# Patient Record
Sex: Female | Born: 1985 | Race: Black or African American | Hispanic: No | Marital: Married | Smoking: Current some day smoker
Health system: Southern US, Community
[De-identification: ages and names within clinical notes are randomized; demographics above are authoritative.]

## PROBLEM LIST (undated history)

## (undated) ENCOUNTER — Emergency Department (HOSPITAL_COMMUNITY): Admission: EM | Payer: Medicaid Other

## (undated) ENCOUNTER — Inpatient Hospital Stay (HOSPITAL_COMMUNITY): Payer: Self-pay

## (undated) DIAGNOSIS — I10 Essential (primary) hypertension: Secondary | ICD-10-CM

## (undated) DIAGNOSIS — D649 Anemia, unspecified: Secondary | ICD-10-CM

## (undated) DIAGNOSIS — F329 Major depressive disorder, single episode, unspecified: Secondary | ICD-10-CM

## (undated) DIAGNOSIS — A599 Trichomoniasis, unspecified: Secondary | ICD-10-CM

## (undated) DIAGNOSIS — F32A Depression, unspecified: Secondary | ICD-10-CM

## (undated) DIAGNOSIS — R011 Cardiac murmur, unspecified: Secondary | ICD-10-CM

## (undated) DIAGNOSIS — B999 Unspecified infectious disease: Secondary | ICD-10-CM

## (undated) DIAGNOSIS — O139 Gestational [pregnancy-induced] hypertension without significant proteinuria, unspecified trimester: Secondary | ICD-10-CM

## (undated) DIAGNOSIS — IMO0002 Reserved for concepts with insufficient information to code with codable children: Secondary | ICD-10-CM

## (undated) DIAGNOSIS — N730 Acute parametritis and pelvic cellulitis: Secondary | ICD-10-CM

## (undated) DIAGNOSIS — A549 Gonococcal infection, unspecified: Secondary | ICD-10-CM

## (undated) DIAGNOSIS — A749 Chlamydial infection, unspecified: Secondary | ICD-10-CM

## (undated) HISTORY — PX: GANGLION CYST EXCISION: SHX1691

## (undated) HISTORY — PX: MOUTH SURGERY: SHX715

---
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
## 2006-01-25 ENCOUNTER — Emergency Department (HOSPITAL_COMMUNITY): Admission: EM | Admit: 2006-01-25 | Discharge: 2006-01-25 | Payer: Self-pay | Admitting: Emergency Medicine

## 2006-04-06 ENCOUNTER — Emergency Department (HOSPITAL_COMMUNITY): Admission: EM | Admit: 2006-04-06 | Discharge: 2006-04-06 | Payer: Self-pay | Admitting: *Deleted

## 2006-06-11 ENCOUNTER — Emergency Department (HOSPITAL_COMMUNITY): Admission: EM | Admit: 2006-06-11 | Discharge: 2006-06-11 | Payer: Self-pay | Admitting: Emergency Medicine

## 2006-12-09 ENCOUNTER — Emergency Department (HOSPITAL_COMMUNITY): Admission: EM | Admit: 2006-12-09 | Discharge: 2006-12-09 | Payer: Self-pay | Admitting: Emergency Medicine

## 2007-03-03 ENCOUNTER — Inpatient Hospital Stay (HOSPITAL_COMMUNITY): Admission: AD | Admit: 2007-03-03 | Discharge: 2007-03-03 | Payer: Self-pay | Admitting: Obstetrics & Gynecology

## 2007-03-06 ENCOUNTER — Ambulatory Visit: Payer: Self-pay | Admitting: Obstetrics and Gynecology

## 2007-03-06 ENCOUNTER — Inpatient Hospital Stay (HOSPITAL_COMMUNITY): Admission: AD | Admit: 2007-03-06 | Discharge: 2007-03-07 | Payer: Self-pay | Admitting: Obstetrics and Gynecology

## 2007-03-09 ENCOUNTER — Emergency Department (HOSPITAL_COMMUNITY): Admission: EM | Admit: 2007-03-09 | Discharge: 2007-03-09 | Payer: Self-pay | Admitting: Emergency Medicine

## 2007-11-28 ENCOUNTER — Emergency Department (HOSPITAL_COMMUNITY): Admission: EM | Admit: 2007-11-28 | Discharge: 2007-11-29 | Payer: Self-pay | Admitting: Emergency Medicine

## 2008-01-11 ENCOUNTER — Inpatient Hospital Stay (HOSPITAL_COMMUNITY): Admission: AD | Admit: 2008-01-11 | Discharge: 2008-01-11 | Payer: Self-pay | Admitting: Obstetrics & Gynecology

## 2008-05-24 ENCOUNTER — Inpatient Hospital Stay (HOSPITAL_COMMUNITY): Admission: AD | Admit: 2008-05-24 | Discharge: 2008-05-25 | Payer: Self-pay | Admitting: Obstetrics and Gynecology

## 2008-06-01 ENCOUNTER — Inpatient Hospital Stay (HOSPITAL_COMMUNITY): Admission: AD | Admit: 2008-06-01 | Discharge: 2008-06-04 | Payer: Self-pay | Admitting: Obstetrics and Gynecology

## 2008-06-02 ENCOUNTER — Encounter (INDEPENDENT_AMBULATORY_CARE_PROVIDER_SITE_OTHER): Payer: Self-pay | Admitting: Obstetrics and Gynecology

## 2008-06-06 ENCOUNTER — Inpatient Hospital Stay (HOSPITAL_COMMUNITY): Admission: AD | Admit: 2008-06-06 | Discharge: 2008-06-09 | Payer: Self-pay | Admitting: Obstetrics and Gynecology

## 2009-08-24 ENCOUNTER — Ambulatory Visit: Payer: Self-pay | Admitting: Diagnostic Radiology

## 2009-08-24 ENCOUNTER — Emergency Department (HOSPITAL_BASED_OUTPATIENT_CLINIC_OR_DEPARTMENT_OTHER): Admission: EM | Admit: 2009-08-24 | Discharge: 2009-08-24 | Payer: Self-pay | Admitting: Emergency Medicine

## 2009-08-29 ENCOUNTER — Ambulatory Visit: Payer: Self-pay | Admitting: Diagnostic Radiology

## 2009-08-29 ENCOUNTER — Emergency Department (HOSPITAL_BASED_OUTPATIENT_CLINIC_OR_DEPARTMENT_OTHER): Admission: EM | Admit: 2009-08-29 | Discharge: 2009-08-29 | Payer: Self-pay | Admitting: Emergency Medicine

## 2010-04-15 ENCOUNTER — Emergency Department (HOSPITAL_BASED_OUTPATIENT_CLINIC_OR_DEPARTMENT_OTHER): Admission: EM | Admit: 2010-04-15 | Discharge: 2010-04-15 | Payer: Self-pay | Admitting: Emergency Medicine

## 2010-05-26 ENCOUNTER — Emergency Department (HOSPITAL_COMMUNITY): Admission: EM | Admit: 2010-05-26 | Discharge: 2010-05-26 | Payer: Self-pay | Admitting: Emergency Medicine

## 2010-07-19 DIAGNOSIS — R87619 Unspecified abnormal cytological findings in specimens from cervix uteri: Secondary | ICD-10-CM

## 2010-07-19 DIAGNOSIS — IMO0002 Reserved for concepts with insufficient information to code with codable children: Secondary | ICD-10-CM

## 2010-07-19 HISTORY — DX: Unspecified abnormal cytological findings in specimens from cervix uteri: R87.619

## 2010-07-19 HISTORY — DX: Reserved for concepts with insufficient information to code with codable children: IMO0002

## 2010-09-16 ENCOUNTER — Emergency Department (INDEPENDENT_AMBULATORY_CARE_PROVIDER_SITE_OTHER): Payer: Medicaid Other

## 2010-09-16 ENCOUNTER — Emergency Department (HOSPITAL_BASED_OUTPATIENT_CLINIC_OR_DEPARTMENT_OTHER)
Admission: EM | Admit: 2010-09-16 | Discharge: 2010-09-16 | Disposition: A | Discharge status: Home / Self Care | Payer: Medicaid Other | Attending: Emergency Medicine | Admitting: Emergency Medicine

## 2010-09-16 DIAGNOSIS — J40 Bronchitis, not specified as acute or chronic: Secondary | ICD-10-CM | POA: Insufficient documentation

## 2010-09-16 DIAGNOSIS — R0989 Other specified symptoms and signs involving the circulatory and respiratory systems: Secondary | ICD-10-CM

## 2010-09-16 DIAGNOSIS — R05 Cough: Secondary | ICD-10-CM

## 2010-09-16 DIAGNOSIS — R059 Cough, unspecified: Secondary | ICD-10-CM | POA: Insufficient documentation

## 2010-09-16 DIAGNOSIS — Z87891 Personal history of nicotine dependence: Secondary | ICD-10-CM

## 2010-09-29 LAB — POCT CARDIAC MARKERS
CKMB, poc: <1 ng/mL — ABNORMAL LOW (ref 1.0–8.0)
Troponin i, poc: <0.05 ng/mL (ref 0.00–0.09)

## 2010-09-29 LAB — URINALYSIS, ROUTINE W REFLEX MICROSCOPIC
Glucose, UA: NEGATIVE mg/dL
Urobilinogen, UA: 0.2 mg/dL (ref 0.0–1.0)

## 2010-09-29 LAB — POCT PREGNANCY, URINE: Preg Test, Ur: NEGATIVE

## 2010-09-29 LAB — BASIC METABOLIC PANEL
Calcium: 8.9 mg/dL (ref 8.4–10.5)
GFR calc non Af Amer: >60 mL/min (ref >60)
Glucose, Bld: 113 mg/dL — ABNORMAL HIGH (ref 70–99)
Potassium: 4.4 mEq/L (ref 3.5–5.1)

## 2010-09-29 LAB — CBC
HCT: 41.4 % (ref 36.0–46.0)
MCV: 93.2 fL (ref 78.0–100.0)
Platelets: 258 10*3/uL (ref 150–400)

## 2010-09-29 LAB — DIFFERENTIAL
Basophils Absolute: 0.1 10*3/uL (ref 0.0–0.1)
Basophils Relative: 1 % (ref 0–1)
Lymphocytes Relative: 45 % (ref 12–46)
Monocytes Relative: 7 % (ref 3–12)
Neutro Abs: 2.9 10*3/uL (ref 1.7–7.7)
Neutrophils Relative %: 45 % (ref 43–77)

## 2010-09-29 LAB — CARDIAC PANEL(CRET KIN+CKTOT+MB+TROPI)
Relative Index: 0.6 (ref 0.0–2.5)
Total CK: 209 U/L — ABNORMAL HIGH (ref 7–177)
Troponin I: 0.01 ng/mL (ref 0.00–0.06)

## 2010-10-01 LAB — PREGNANCY, URINE: Preg Test, Ur: NEGATIVE

## 2010-10-08 LAB — GC/CHLAMYDIA PROBE AMP, GENITAL: GC Probe Amp, Genital: NEGATIVE

## 2010-10-08 LAB — DIFFERENTIAL
Eosinophils Absolute: 0.1 10*3/uL (ref 0.0–0.7)
Lymphocytes Relative: 15 % (ref 12–46)
Lymphocytes Relative: 55 % — ABNORMAL HIGH (ref 12–46)
Lymphs Abs: 5 10*3/uL — ABNORMAL HIGH (ref 0.7–4.0)
Monocytes Absolute: 0.9 10*3/uL (ref 0.1–1.0)
Monocytes Relative: 16 % — ABNORMAL HIGH (ref 3–12)
Monocytes Relative: 7 % (ref 3–12)
Neutro Abs: 3.9 10*3/uL (ref 1.7–7.7)
Neutrophils Relative %: 36 % — ABNORMAL LOW (ref 43–77)

## 2010-10-08 LAB — URINALYSIS, ROUTINE W REFLEX MICROSCOPIC
Glucose, UA: NEGATIVE mg/dL
Glucose, UA: NEGATIVE mg/dL
Hgb urine dipstick: NEGATIVE
Ketones, ur: NEGATIVE mg/dL
Leukocytes, UA: NEGATIVE
Nitrite: NEGATIVE
Specific Gravity, Urine: 1.007 (ref 1.005–1.030)
Specific Gravity, Urine: 1.017 (ref 1.005–1.030)
Urobilinogen, UA: 0.2 mg/dL (ref 0.0–1.0)
pH: 7 (ref 5.0–8.0)

## 2010-10-08 LAB — URINE MICROSCOPIC-ADD ON

## 2010-10-08 LAB — BASIC METABOLIC PANEL
Chloride: 104 mEq/L (ref 96–112)
Creatinine, Ser: 0.9 mg/dL (ref 0.4–1.2)
GFR calc Af Amer: >60 mL/min (ref >60)
GFR calc non Af Amer: >60 mL/min (ref >60)
Potassium: 3.3 mEq/L — ABNORMAL LOW (ref 3.5–5.1)

## 2010-10-08 LAB — CBC
HCT: 40.6 % (ref 36.0–46.0)
MCV: 89.2 fL (ref 78.0–100.0)
MCV: 89.9 fL (ref 78.0–100.0)
Platelets: 236 10*3/uL (ref 150–400)
RBC: 4.79 MIL/uL (ref 3.87–5.11)
RDW: 13.2 % (ref 11.5–15.5)
WBC: 9 10*3/uL (ref 4.0–10.5)

## 2010-10-08 LAB — WET PREP, GENITAL

## 2010-10-08 LAB — PREGNANCY, URINE: Preg Test, Ur: NEGATIVE

## 2010-10-08 LAB — COMPREHENSIVE METABOLIC PANEL
Albumin: 4.2 g/dL (ref 3.5–5.2)
BUN: 5 mg/dL — ABNORMAL LOW (ref 6–23)
Calcium: 8.7 mg/dL (ref 8.4–10.5)
Creatinine, Ser: 0.9 mg/dL (ref 0.4–1.2)
Potassium: 3.6 mEq/L (ref 3.5–5.1)
Total Protein: 7.2 g/dL (ref 6.0–8.3)
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
## 2010-12-01 NOTE — Discharge Summary (Signed)
Morgan Newton, POTE               ACCOUNT NO.:  [id number]   MEDICAL RECORD NO.:  [id number]          PATIENT TYPE:  INP   LOCATION:  9306                          FACILITY:  [facility]   PHYSICIAN:  Phil D. Kalman Shan, M.D.     DATE OF BIRTH:  28-Jul-1985   DATE OF ADMISSION:  03/06/2007  DATE OF DISCHARGE:  03/07/2007                               DISCHARGE SUMMARY   ADMISSION DIAGNOSES:  1. Gonorrhea.  2. Pelvic inflammatory disease.   DISCHARGE DIAGNOSES:  1. Gonorrhea.  2. Pelvic inflammatory disease.   HISTORY OF PRESENT ILLNESS:  The patient is a 25 year old G 1, P 1, who  was seen in the emergency room for pelvic pain.  Subsequent analysis  found the patient to have a positive gonorrhea culture.  The patient was  given a prescription for treatment with doxycycline and cephalosporin,  but the patient was unable to fill these prescriptions and returned to  the emergency room a few days later, complaining of increased pain.   On admission the patient was found to have severe abdominal pain and  severe cervical motion tenderness.  She was afebrile and had no other  symptoms.  The decision at this point was made to admit her for IV  antibiotics.   PHYSICAL EXAMINATION:  VITAL SIGNS: Temperature was 98.6 degrees, rest  of vital signs normal and stable.  ABDOMEN:  Moderate abdominal tenderness to palpation.  PELVIC:  Moderate cervical motion tenderness.  LUNGS:  Clear.  HEART:  Normal sounds.   HOSPITAL COURSE:  The patient was admitted and was started on  Ceftriaxone 1 gram q12h and was to continue her Doxycycline 100 mg po  twice daily.  While she was in the hospital, she remained afebrile.  Her  pain was controlled using Naproxen and Dilaudid.  On the day of  discharge the patient reported markedly improved pain scores.   LABORATORY DATA:  White count 11.4.  ESR 16.  Negative urinalysis.  Negative wet prep.   CONDITION ON DISCHARGE:  Stable.   DISCHARGE MEDICATIONS:  1.  Ibuprofen 800 mg p.o. q.8h. p.r.n. pain.  2. Doxycycline 100 mg p.o. twice daily, to complete a 14-day course.   DISCHARGE INSTRUCTIONS:  The patient was advised to make sure that her  partner was treated, given her diagnosis of gonorrhea.  She was also  advised to call with any worsening fevers, abdominal pain or any other  concerns or symptoms.  She was also strongly advised to complete her  antibiotic regimen.  The patient was given a number of the [facility],  to call to make an appointment for followup in the next two weeks.      [name], MD  Electronically Signed     ______________________________  Franchot Heidelberg. Kalman Shan, M.D.    UD/MEDQ  D:  03/07/2007  T:  03/07/2007  Job:  [id number]

## 2010-12-01 NOTE — Discharge Summary (Signed)
Morgan Newton, Morgan Newton               ACCOUNT NO.:  [id number]   MEDICAL RECORD NO.:  [id number]          PATIENT TYPE:  INP   LOCATION:  9371                          FACILITY:  [facility]   PHYSICIAN:  Everett Graff, M.D.   DATE OF BIRTH:  1985/10/11   DATE OF ADMISSION:  06/06/2008  DATE OF DISCHARGE:  06/09/2008                               DISCHARGE SUMMARY   ADMITTING DIAGNOSES:  1. Four days postpartum, status post spontaneous vaginal delivery      June 02, 2008, of intrauterine fetal demise.  2. Preeclampsia.  3. Anemia.  4. History of asthma.  5. Left lower quadrant pain   DISCHARGE DIAGNOSIS:  1. She is now postpartum and day #7 status post spontaneous vaginal      delivery intrauterine fetal demise on June 02, 2008.  2. Preeclampsia.  3. Anemia.  4. History of asthma.  5. Left lower quadrant pain, resolved.  6. Constipation.   PROCEDURES:  Magnesium sulfate infusion for approximately 24 hours while  admitted to adult intensive care unit as well as blood pressure  management.   HOSPITAL COURSE:  Morgan Newton is a 25 year old single black female who  presented on the day of admission 4 days postpartum status post a  preterm spontaneous vaginal delivery of an IFD on June 02, 2008.  Her chief complaint was general malaise, not feeling well, which had  worsened over the course of that day.  She was complaining on admission  of left lower quadrant pain which radiated to her back.  She also had  some chest pain which she described as someone standing on her chest.  She was without headache, visual disturbances or any right upper  quadrant pain.  Her blood pressures while in maternity admissions unit  for evaluation were ranging between 765-465 systolic over diastolics  were ranging 03-546, oxygen saturation on pulse ox was 100%, heart rate  was stable 60s to 70s and she was afebrile.  She did have 1+ pitting  edema in her lower extremities but was not hyperreflexic  and was without  clonus.  She did not have any CVA tenderness.  Her abdomen was soft and  she did not have rebound or guarding, and fundus was not palpated.   ADMISSION LABS:  Her white count was slightly elevated equal to 12.7.  Differential showed absolute granulocytes were high equal 8.7.  Her  hemoglobin at that time was 10.1, hematocrit 31.4 and platelets were  358,000, LDH was high equal to 291.  Uric acid was 5.2.  Cath UA showed  30 protein with small amount of hemoglobin.  Her CMET was within normal  limits except SGOT was high equal to 42.  After consultation with Dr.  Cletis Media, plan was made for the patient to be admitted to [facility] for  magnesium sulfate therapy and blood pressure management.  She received a  4 g IV bolus followed by 2 g maintenance, which she received for a  minimum of 24 hours.  The magnesium was discontinued the morning of  June 08, 2008.  She was initially started on labetalol 200  mg p.o.  b.i.d.  She did have to receive several doses for blood pressures  outside of the parameters given for systolics greater than 923 or  diastolics greater than or equal to 105.  She received some IV labetalol  as well as IV hydralazine.  On the 20th her labetalol was increased to  400 mg p.o. b.i.d.  It would later be increased to 400 t.i.d. while she  was still on the magnesium.  After her magnesium was discontinued, blood  pressures were still elevated from time to time and on the afternoon of  the 21st she was started on Procardia XL 60 mg p.o. daily with good  response. She did have, during her stay, some intermittent headaches  which she took Percocet and Vicodin as needed.  On day 2 of her ICU  stay, blood pressures were ranging mid to upper 300T systolic to 622Q  and diastolics were ranging mid 80s to highest of 102.  She was  afebrile.  Her weight that day was around 188.  On date 2 in the ICU,  hemoglobin did continue to drop to 9.1, probably related to dilution   from IV fluids.  Her white count did decrease to 10.7.  Her platelets  were stable at 332,000.  SGOT did return to 29 after being elevated the  previous day.  Uric acid went down to 5.0 from 5.2, LDH decreased to 226  from 291.  Physical exam remained within normal limits.  Lungs were  clear bilaterally.  She had negative Homan's.  Mild edema in her lower  extremities.  DTRs remained 1+.  ICU day 3, the patient denied any  blurry vision except while she was on the magnesium, but no visual  disturbances.  She was voiding without difficulty.  No right upper  quadrant epigastric pain, no cough or chest pain or tightness.  She  continued to have no bowel movement during her admission.  No vaginal  bleeding since her admission and family remained at bedside.  Blood  pressures were ranging on day 3 systolic 333-545 and diastolic 62-56.  Morning blood pressure was 155/91.  She was afebrile.  She did have a  CBC drawn and white count continued decreased to 7.5.  Hemoglobin did  continue to decrease as well and went down to 8.6 from 9.1, platelets  were stable at 327,000, and her potassium was slightly low equal to 3.4  and calcium was slightly low equal to 6.5 with suspected relationship to  magnesium infusion.  Her weight did go up to 190 on that day.  Physical  exam remained within normal limits.  Extremities were with trace edema,  no clonus and DTRs were 1+.  She was started on Colace b.i.d. and  subsequently had a bowel movement on postpartum day #6.  She did have a  social work consult on the 21st which was hospital day #3.  Per social  work consult, she reported that the patient had been adequately coping  with the current loss, except for some insomnia.  The patient was given  a list of resources that she could contact if needed.  Hospital day #4  on the 22nd the patient continued to deny headache, visual changes or  abdominal pain, no shortness of breath, feeling like she has had   insomnia but does not require antidepressant at this time.  T-max was  99.9.  Most recent temperature was 98.2, blood pressure range had and  389-373S systolic over 28J-68T  diastolic.  O2 sats have remained greater  than or equal to 97%.  Weight was 191 yesterday and today is 183 with  good diuresis.  Lungs are clear bilaterally.  Cardiovascular is regular  rate and rhythm.  Abdomen, soft, nontender and nondistended, and she had  no calf pain and no tenderness and DTRs are 1+ with no edema.  The  patient was deemed to have received full benefit of her hospital stay  and Dr. Mancel Bale discharged her home in stable condition.   DISCHARGE MEDICATIONS:  1. Procardia XL 60 mg 1 tablet p.o. daily.  2. [facility] for iron supplement 1 tablet p.o. daily.  3. She is also to obtain a stool softener and may take 1 tablet p.o.      daily or b.i.d. for constipation.  Discussed with her, if no bowel      movements in 3 days, then she may use some over-the-counter MiraLax      as directed.  4. She also has Motrin at home, which she may take 600 mg p.o. q.6      hours p.r.n. pain.  5. Benadryl 25 mg p.o. nightly p.r.n. insomnia.   DISCHARGE FOLLOWUP:  She is to follow up Wednesday in the office for  blood pressure check.  The patient reports she does not have a phone,  and so her mother's number was taken and will have office call her  tomorrow to schedule an appointment.  I did instruct the patient to call  after lunch if no one had called her tomorrow morning.   ADDITIONAL LAB WORK:  She still has partial thrombophilia panel that is  pending.  However, on November 20 she had a normal PTT which was equal  to 29.  Her prothrombin time was normal 13.6 and INR was equal to 1, and  all those were within normal limits.  She also had a urine culture done  and showed no growth.  On November 20, she had a homocystine level drawn  that was within normal limits equal to 10.5.  She also had a negative  screen  for  prothrombin 2G mutation.  She did have a serum tox screen that was drawn  November 14 and everything was negative with the exception of  cannabinoids.  Also, just to mention, she had a group beta strep done at  that time.  It was negative.  TORCH titers were also within normal  limits.      Candice Donne Hazel, CNM      ______________________________  Everett Graff, M.D.    CHS/MEDQ  D:  06/09/2008  T:  06/09/2008  Job:  [id number]
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
## 2010-12-04 NOTE — Discharge Summary (Signed)
Morgan Newton  Patient:    Morgan Newton                      MRN: [id number] Adm. Date:  [id number] Disc. Date: [id number] Attending:  Randye Lobo Newton                           Discharge Summary  IDENTIFICATION:  Morgan Newton was a 25 year old female.  INITIAL ASSESSMENT/DIAGNOSIS:  Morgan Newton was admitted to the hospital after taking an overdose of birth control pills and she told the school counselor about the attempt.  She said she felt stressed out recently.  The stresses were dealing with her mother, her sister, and her school.  In school she had been accused of disrupting the class.  She said she thought the teacher was the bigger problem and she tried to explain to the principal about the problem, but she got in school suspension anyway.  Her mother went to the school and explained it and it worked out, she said.  With her youngest sister, she said she was just very annoying.  For example when she would get telephone calls, she would leave it off the hook and while waiting for Morgan Newton to come to pick it up, she would be saying negative things about her that were private that she did not want her friends to know, just trying to embarrass her.  She said her mother she believed was not listening to her or believing her about things and things just built up over time.  That is why she took the overdose of birth control pills.  MENTAL STATUS:  Mental status at the time of the initial evaluation revealed an alert, oriented girl who looked more like a 24 year old or 24 year old than a 24 year old.  She also talked more maturely than was expected.  She admitted to suicidal ideation and to taking an overdose, but denied any current ideation or intent.  There was no evidence of any psychosis.  Short and long term memory were intact.  Judgment currently seemed adequate.  Insight was minimum.  Intellectual functioning seemed at least average.  Concentration  was adequate.  Other pertinent history can be obtained from the psychosocial service summary.  PHYSICAL EXAMINATION:  Within normal limits or noncontributory, though she was somewhat overweight.  ADMITTING DIAGNOSES:   AXIS I:  Depressive disorder, not otherwise specified.  AXIS II:  Deferred. AXIS III:  Asthma by history.  AXIS IV:  Moderate.   AXIS V:  45/70.  LABORATORY FINDINGS:  All indicated laboratory examinations were within normal limits or noncontributory.  HOSPITAL COURSE:  While in the hospital Morgan Newton was very appropriate.  She was talkative.  She did seem more mature than her 13 years and was actually more mature than most of the peers on the unit at the time.  She admitted that she overreacted at times.  She admitted that she did not talk to people about her problems and that talking about things helped, as demonstrated by her talking in the hospital.  She was very capable of coming up with alternative ways of dealing with her situation, particularly her anger.  She knew she needed to shrug her sister off and said she was willing to do that.  She met with her mother.  The family session went well and she was discharged.  CONDITION AT TIME OF DISCHARGE:  She had consistently denied any threats to herself or  anyone else while she was in the hospital and did so at the time of discharge.  POST HOSPITAL CARE PLANS:  She was referred to Morgan Newton at the [facility] at [facility] with an appointment for May 10. No medications were used in the hospital, nor were any prescribed at the time of discharge.  There were no restrictions placed on her activity or her diet.  FINAL DIAGNOSES:   AXIS I:  Depressive disorder, not otherwise specified.  AXIS II:  No diagnosis. AXIS III:  Asthma by history.  AXIS IV:  Moderate.   AXIS V:  27. DD:  11/25/99 TD:  11/28/99 Job: [id number]
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
## 2010-12-04 NOTE — Discharge Summary (Signed)
Morgan Newton, Morgan Newton NO.:  [id number]   MEDICAL RECORD NO.:  [id number]          PATIENT TYPE:  INP   LOCATION:  9318                          FACILITY:  [facility]   PHYSICIAN:  Eli Hose, M.D.DATE OF BIRTH:  27-Mar-1986   DATE OF ADMISSION:  06/01/2008  DATE OF DISCHARGE:  06/04/2008                               DISCHARGE SUMMARY   ADMISSION DIAGNOSES:  1. Intrauterine gestation at 27 weeks.  2. Intrauterine fetal demise.  3. Hypertension of pregnancy.  4. Anemia (hemoglobin 10.1).   POSTOPERATIVE DIAGNOSES:  1. Intrauterine gestation at 27 weeks.  2. Intrauterine fetal demise.  3. Hypertension of pregnancy.  4. Anemia (hemoglobin 10.1).  5. Breech presentation.  6. Entrapped fetal head.   PROCEDURE:  1. This admission, on June 02, 2008, vaginal delivery of a fetal      demise from a breech presentation.  2. Nitroglycerin for entrapped fetal head.   HISTORY OF PRESENT ILLNESS:  [name] is a 25 year old female, gravida  3, para 1-0-1-1, who has been followed at the [facility].  She presents at 79 weeks' gestation.  Her due date is September 01, 2008.  The the patient presented to the office and reported that she had not  felt her baby move for 2 days.  She denied rupture of membranes or  bleeding.  She did have some occasional cramping.  She denied headaches,  visual changes, and right upper quadrant discomfort.  An ultrasound was  performed that showed a fetal demise.  Her pregnancy factors included a  history of gestational diabetes, a history of postpartum hypertension,  bacterial vaginosis that was treated, and late entry into prenatal care.  Please see her history and physical exam for details.   ADMISSION EXAM:  Her blood pressure on admission was 171/101 and 161/89.  Other vital signs were stable.  Her exam was within normal limits.  The  cervix was fingertip,  60% effaced, and the presenting part was high in  the pelvis.  Breech presentation was confirmed on ultrasound.  There was  negative ferning and negative pulling.  Her Nitrazine was positive,  however.   LABORATORY VALUES:  Blood type is O positive, antibody screen negative,  HBsAg negative, rubella is immune, and HIV is negative.  Hemoglobin is  10.5, white blood cell count is 12,400, and platelet count is 362,000.   HOSPITAL COURSE:  The patient was admitted and her membranes were  ruptured.  Pitocin was started.  She delivered a nonviable infant at  approximately 4 o'clock p.m. on June 02, 2008.  The infant was in a  breech presentation.  The Apgars were 0 at 1-minute and 0 at 5 minutes.  The body delivered but the head became entrapped.  We used nitroglycerin  and the cervix relaxed enough to deliver the fetal head.  No fetal  anomalies were appreciated.  The face was very distorted.  There was  some edema on the fetus.  There was no foul smell consistent with  chorioamnionitis.  Autopsy was  discussed and recommended.  At the time  of delivery, the family was undecided.  The decision was eventually made  to proceed with autopsy.  Gonorrhea and Chlamydia cultures that were  obtained on admission returned as a negative.  Her hemoglobin after  delivery was 8.6.  Her white blood cell count was 13,000.  Her platelet  count was 268,000.  Her urine culture was negative.  Her RPR was  negative.  Her D-dimer was 1.57 and this was slightly elevated.  Her PT  and PTT were normal.  Her lupus anticoagulant and her Factor V Leiden  were normal.  Her protein C was slightly low at 62 (reference range 70-  140).  Her protein S total was normal, but her functional protein S was  slightly low at 62 (reference range 69-129).  Her chemistries were  basically within normal limits.  Her Cardiolite and antibody was  slightly low.  Her ANA was negative.  Her beta 2-glycoprotein antibodies  were  negative.  Her prothrombin gene mutation was negative.  Her wet  prep was negative.  The patient quickly recovered from her delivery.  Her blood pressures after delivery were 148/86, 134/82, and 157/87.  Her  serum toxic screen was negative.   FOLLOWUP INSTRUCTIONS:  The patient was discharged to home with plans  for followup in the office.   DISCHARGE MEDICATIONS:  Motrin, prenatal vitamins, metronidazole, and  Terazol as needed.   FINAL PATHOLOGY REPORT:  The placenta showed multiple intervillous  thrombi involving 20% of the placental plate.  Focal ischemic changes of  adjacent chorionic villi was noted.  There was slight chorioamnionitis.  There was a three-vessel umbilical cord.  The placenta weighted 155 g.  Autopsy was pending at the time of discharge.      Eli Hose, M.D.  Electronically Signed     AVS/MEDQ  D:  07/16/2008  T:  07/17/2008  Job:  [id number]
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
## 2011-02-04 ENCOUNTER — Encounter: Payer: Self-pay | Admitting: *Deleted

## 2011-02-04 ENCOUNTER — Emergency Department (HOSPITAL_BASED_OUTPATIENT_CLINIC_OR_DEPARTMENT_OTHER)
Admission: EM | Admit: 2011-02-04 | Discharge: 2011-02-04 | Disposition: A | Discharge status: Home / Self Care | Payer: Medicaid Other | Attending: Emergency Medicine | Admitting: Emergency Medicine

## 2011-02-04 ENCOUNTER — Emergency Department (INDEPENDENT_AMBULATORY_CARE_PROVIDER_SITE_OTHER): Payer: Medicaid Other

## 2011-02-04 ENCOUNTER — Emergency Department (HOSPITAL_BASED_OUTPATIENT_CLINIC_OR_DEPARTMENT_OTHER): Payer: Medicaid Other

## 2011-02-04 DIAGNOSIS — IMO0001 Reserved for inherently not codable concepts without codable children: Secondary | ICD-10-CM | POA: Insufficient documentation

## 2011-02-04 DIAGNOSIS — Z9181 History of falling: Secondary | ICD-10-CM

## 2011-02-04 DIAGNOSIS — W19XXXA Unspecified fall, initial encounter: Secondary | ICD-10-CM

## 2011-02-04 DIAGNOSIS — M25569 Pain in unspecified knee: Secondary | ICD-10-CM

## 2011-02-04 DIAGNOSIS — I10 Essential (primary) hypertension: Secondary | ICD-10-CM | POA: Insufficient documentation

## 2011-02-04 DIAGNOSIS — S30860A Insect bite (nonvenomous) of lower back and pelvis, initial encounter: Secondary | ICD-10-CM | POA: Insufficient documentation

## 2011-02-04 DIAGNOSIS — W010XXA Fall on same level from slipping, tripping and stumbling without subsequent striking against object, initial encounter: Secondary | ICD-10-CM | POA: Insufficient documentation

## 2011-02-04 DIAGNOSIS — S8000XA Contusion of unspecified knee, initial encounter: Secondary | ICD-10-CM

## 2011-02-04 DIAGNOSIS — F17200 Nicotine dependence, unspecified, uncomplicated: Secondary | ICD-10-CM | POA: Insufficient documentation

## 2011-02-04 DIAGNOSIS — J45909 Unspecified asthma, uncomplicated: Secondary | ICD-10-CM | POA: Insufficient documentation

## 2011-02-04 DIAGNOSIS — Y92009 Unspecified place in unspecified non-institutional (private) residence as the place of occurrence of the external cause: Secondary | ICD-10-CM | POA: Insufficient documentation

## 2011-02-04 DIAGNOSIS — W57XXXA Bitten or stung by nonvenomous insect and other nonvenomous arthropods, initial encounter: Secondary | ICD-10-CM | POA: Insufficient documentation

## 2011-02-04 DIAGNOSIS — M79609 Pain in unspecified limb: Secondary | ICD-10-CM | POA: Insufficient documentation

## 2011-02-04 HISTORY — DX: Essential (primary) hypertension: I10

## 2011-02-04 HISTORY — DX: Anemia, unspecified: D64.9

## 2011-02-04 MED ORDER — TETANUS-DIPHTH-ACELL PERTUSSIS 5-2.5-18.5 LF-MCG/0.5 IM SUSP: 0.5000 mL | Freq: Once | INTRAMUSCULAR | Status: DC

## 2011-02-04 MED ORDER — ACETAMINOPHEN 325 MG PO TABS
975.0000 mg | ORAL_TABLET | Freq: Once | ORAL | Status: AC
  Administered 2011-02-04: 975 mg via ORAL
  Filled 2011-02-04: qty 3

## 2011-02-04 NOTE — ED Provider Notes (Addendum)
History    chief complaint fall #1 fall and left knee pain #2 insect bite to right cheek  Chief Complaint  Patient presents with  . Knee Pain   Patient is a 25 y.o. female presenting with knee pain.  Knee Pain   history of present illness patient reports having slipped and fallen 2 days ago on a wet floor at home landing on her left knee since the event complains of left knee pain she also complains of pain from an insect bite on her right buttock radiating down her right thigh no fever no treatment prior to coming here pain is mild at present pain is worse with walking improved with rest  Past Medical History  Diagnosis Date  . Asthma   . Anemia   . Hypertension     Past Surgical History  Procedure Date  . Ganglion cyst excision     No family history on file.  History  Substance Use Topics  . Smoking status: Current Some Day Smoker  . Smokeless tobacco: Not on file  . Alcohol Use: Yes   marijuana last time 24 hours ago  OB History    Grav Para Term Preterm Abortions TAB SAB Ect Mult Living                  Review of Systems  Constitutional: Negative.   HENT: Negative.   Respiratory: Negative.   Cardiovascular: Negative.   Gastrointestinal: Negative.   Musculoskeletal: Positive for arthralgias.       Left knee pain  Skin: Positive for wound.       Skin lesion of right BUTTOCK  Neurological: Negative.   Hematological: Negative.   Psychiatric/Behavioral: Negative.     Physical Exam  BP 124/79  Pulse 94  Temp(Src) 99.1 F (37.3 C) (Oral)  Resp 20  Ht 4' 11"  (1.499 m)  Wt 180 lb (81.647 kg)  BMI 36.36 kg/m2  SpO2 100%  Physical Exam  Constitutional: She appears well-developed and well-nourished.  HENT:  Head: Normocephalic and atraumatic.  Eyes: Conjunctivae are normal. Pupils are equal, round, and reactive to light.  Neck: Neck supple. No tracheal deviation present. No thyromegaly present.  Cardiovascular: Normal rate.   No murmur  heard. Pulmonary/Chest: Effort normal.  Abdominal: Soft. She exhibits no distension. There is no tenderness.       Morbidly obese  Musculoskeletal: Normal range of motion. She exhibits no edema and no tenderness.       Left lower extremity minimally tender over patella no swelling no ligamentous laxity no Lachman sign or posterior drawer sign neurovascularly intact  Neurological: She is alert. Coordination normal.       Gait normal walks without limp  Skin: Skin is warm and dry. No rash noted.       There is a single 2 mm hyperpigmented lesion at the center of her right block with minimal surrounding redness no tenderness no foreign body noted no swelling no other skin lesion  Psychiatric: She has a normal mood and affect.    ED Course  Procedures  MDM  Patient refused tetanus shot I explained to her the risk of tetanus and that can be a life-threatening infection she understands. Plan Tylenol for pain recheck wounds at [facility] urgent care Center or return in 2 days . Warm soaks 4 times daily for 30 minutes at a time      Orlie Dakin, MD 02/04/11 [place], MD 02/04/11 0215 Note x-ray of left knee  no acute disease per radiologist reviewed by me. No abscess identified on right buttock  Orlie Dakin, MD 02/04/11 2353

## 2011-02-04 NOTE — ED Notes (Signed)
Original knee xr with sunrise view cancelled due to incorrect order.

## 2011-02-04 NOTE — ED Notes (Signed)
Pt slipped and fell 3 days ago and is c/o pain and swelling to left knee. Pt also has an abscess on her right buttocks x2 days.

## 2011-03-31 ENCOUNTER — Encounter (HOSPITAL_BASED_OUTPATIENT_CLINIC_OR_DEPARTMENT_OTHER): Payer: Self-pay | Admitting: *Deleted

## 2011-03-31 ENCOUNTER — Emergency Department (HOSPITAL_BASED_OUTPATIENT_CLINIC_OR_DEPARTMENT_OTHER)
Admission: EM | Admit: 2011-03-31 | Discharge: 2011-03-31 | Disposition: A | Discharge status: Home / Self Care | Payer: Medicaid Other | Attending: Emergency Medicine | Admitting: Emergency Medicine

## 2011-03-31 DIAGNOSIS — A499 Bacterial infection, unspecified: Secondary | ICD-10-CM | POA: Insufficient documentation

## 2011-03-31 DIAGNOSIS — IMO0001 Reserved for inherently not codable concepts without codable children: Secondary | ICD-10-CM | POA: Insufficient documentation

## 2011-03-31 DIAGNOSIS — N760 Acute vaginitis: Secondary | ICD-10-CM | POA: Insufficient documentation

## 2011-03-31 DIAGNOSIS — B349 Viral infection, unspecified: Secondary | ICD-10-CM

## 2011-03-31 DIAGNOSIS — B9689 Other specified bacterial agents as the cause of diseases classified elsewhere: Secondary | ICD-10-CM | POA: Insufficient documentation

## 2011-03-31 DIAGNOSIS — B9789 Other viral agents as the cause of diseases classified elsewhere: Secondary | ICD-10-CM | POA: Insufficient documentation

## 2011-03-31 HISTORY — DX: Cardiac murmur, unspecified: R01.1

## 2011-03-31 LAB — URINALYSIS, ROUTINE W REFLEX MICROSCOPIC
Glucose, UA: NEGATIVE mg/dL
Nitrite: NEGATIVE
Specific Gravity, Urine: 1.022 (ref 1.005–1.030)
pH: 8 (ref 5.0–8.0)

## 2011-03-31 LAB — BASIC METABOLIC PANEL
BUN: 4 mg/dL — ABNORMAL LOW (ref 6–23)
Calcium: 8.9 mg/dL (ref 8.4–10.5)
Creatinine, Ser: 0.7 mg/dL (ref 0.50–1.10)
GFR calc non Af Amer: >60 mL/min (ref >60)
Glucose, Bld: 118 mg/dL — ABNORMAL HIGH (ref 70–99)

## 2011-03-31 LAB — CBC
MCH: 31.3 pg (ref 26.0–34.0)
MCHC: 34.6 g/dL (ref 30.0–36.0)
Platelets: 203 10*3/uL (ref 150–400)

## 2011-03-31 LAB — DIFFERENTIAL
Basophils Relative: 0 % (ref 0–1)
Eosinophils Absolute: 0 10*3/uL (ref 0.0–0.7)
Neutrophils Relative %: 62 % (ref 43–77)

## 2011-03-31 LAB — WET PREP, GENITAL

## 2011-03-31 LAB — URINE MICROSCOPIC-ADD ON

## 2011-03-31 LAB — PREGNANCY, URINE: Preg Test, Ur: NEGATIVE

## 2011-03-31 MED ORDER — ACETAMINOPHEN 500 MG PO TABS
1000.0000 mg | ORAL_TABLET | Freq: Once | ORAL | Status: AC
  Administered 2011-03-31: 1000 mg via ORAL

## 2011-03-31 MED ORDER — IBUPROFEN 800 MG PO TABS
800.0000 mg | ORAL_TABLET | Freq: Once | ORAL | Status: AC
  Administered 2011-03-31: 800 mg via ORAL
  Filled 2011-03-31: qty 1

## 2011-03-31 MED ORDER — METRONIDAZOLE 500 MG PO TABS: 500.0000 mg | ORAL_TABLET | Freq: Two times a day (BID) | ORAL | Status: AC

## 2011-03-31 MED ORDER — IBUPROFEN 800 MG PO TABS: 800.0000 mg | ORAL_TABLET | Freq: Three times a day (TID) | ORAL | Status: AC

## 2011-03-31 MED ORDER — ACETAMINOPHEN 500 MG PO TABS
ORAL_TABLET | ORAL | Status: AC
  Filled 2011-03-31: qty 2

## 2011-03-31 NOTE — ED Notes (Signed)
Pt to triage in w/c, reports 3 days of generalized body aches, vag discharge and burning, denies any cough, sore throat or other c/o.

## 2011-03-31 NOTE — ED Provider Notes (Signed)
History     CSN: [id number] Arrival date & time: 03/31/2011  7:20 PM  HPI This is a 24 year old female complaining of generalized body aches, vaginal discharge for 3 days. Patient had a fever in triage. Tmax 103.2. Denies cough, sore throat, headache, abdominal pain, nausea, vomiting, diarrhea, back pa. Reports associated fever and chills. Reorts possible sick contacts as her daughter is in school.    Past Medical History  Diagnosis Date  . Asthma   . Anemia   . Hypertension   . Heart murmur     Past Surgical History  Procedure Date  . Ganglion cyst excision     History reviewed. No pertinent family history.  History  Substance Use Topics  . Smoking status: Current Some Day Smoker  . Smokeless tobacco: Not on file  . Alcohol Use: Yes    OB History    Grav Para Term Preterm Abortions TAB SAB Ect Mult Living                  Review of Systems  Constitutional: Positive for fever and chills.  HENT: Negative for sore throat, rhinorrhea and neck pain.   Respiratory: Negative for cough, shortness of breath and wheezing.   Gastrointestinal: Negative for nausea, vomiting and abdominal pain.  Genitourinary: Positive for vaginal discharge. Negative for dysuria, hematuria, vaginal bleeding, vaginal pain and pelvic pain.  Musculoskeletal: Positive for myalgias. Negative for back pain.  Neurological: Negative for weakness, numbness and headaches.  All other systems reviewed and are negative.    Physical Exam  BP 113/49  Pulse 71  Temp(Src) 99.9 F (37.7 C) (Oral)  Resp 20  Ht 4' 11"  (1.499 m)  Wt 186 lb (84.369 kg)  BMI 37.57 kg/m2  SpO2 100%  LMP 03/17/2011  Physical Exam  Vitals reviewed. Constitutional: She is oriented to person, place, and time. She appears well-developed and well-nourished.       Febrile  HENT:  Head: Atraumatic.  Eyes: Conjunctivae and EOM are normal. Pupils are equal, round, and reactive to light.  Neck: Normal range of motion. Neck supple.   Cardiovascular: Normal rate, regular rhythm and normal heart sounds.  Exam reveals no friction rub.   No murmur heard. Pulmonary/Chest: Effort normal and breath sounds normal. She has no wheezes. She has no rales. She exhibits no tenderness.  Abdominal: Soft. Bowel sounds are normal. She exhibits no distension and no mass. There is no tenderness. There is no rebound and no guarding.  Genitourinary: Uterus normal. There is no rash, tenderness, lesion or injury on the right labia. There is no rash, tenderness, lesion or injury on the left labia. Uterus is not tender. Cervix exhibits no motion tenderness, no discharge and no friability. Right adnexum displays no tenderness. Left adnexum displays no tenderness. No tenderness or bleeding around the vagina. No signs of injury around the vagina. Vaginal discharge found.  Musculoskeletal: Normal range of motion.  Neurological: She is alert and oriented to person, place, and time. Coordination normal.  Skin: Skin is warm and dry. No rash noted. No erythema. No pallor.    ED Course  Procedures  MDM Patient reports feeling much better after Tylenol and ibuprofen. Temperature down to 99.9. Still complaining of mild body aches. Prep indicates bacterial vaginosis. Will treat with Flagyl x7 days. Suspect patient likely has a viral infection. Advised ibuprofen and Tylenol for her feve rand  to return for worsening symptoms      Sheliah Mends, [place] 03/31/11 2132  Sheliah Mends,  [place] 03/31/11 2137

## 2011-04-01 NOTE — ED Provider Notes (Signed)
Medical screening examination/treatment/procedure(s) were performed by non-physician practitioner and as supervising physician I was immediately available for consultation/collaboration.   Sharyon Cable, MD 04/01/11 [phone]

## 2011-04-02 ENCOUNTER — Encounter (HOSPITAL_COMMUNITY): Payer: Self-pay

## 2011-04-02 ENCOUNTER — Emergency Department (HOSPITAL_COMMUNITY)
Admission: EM | Admit: 2011-04-02 | Discharge: 2011-04-02 | Disposition: A | Discharge status: Home / Self Care | Payer: Medicaid Other | Attending: Emergency Medicine | Admitting: Emergency Medicine

## 2011-04-02 ENCOUNTER — Inpatient Hospital Stay (HOSPITAL_COMMUNITY)
Admission: AD | Admit: 2011-04-02 | Discharge: 2011-04-02 | Disposition: A | Discharge status: Home / Self Care | Payer: Medicaid Other | Source: Ambulatory Visit | Attending: Obstetrics and Gynecology | Admitting: Obstetrics and Gynecology

## 2011-04-02 DIAGNOSIS — A6000 Herpesviral infection of urogenital system, unspecified: Secondary | ICD-10-CM | POA: Insufficient documentation

## 2011-04-02 DIAGNOSIS — R109 Unspecified abdominal pain: Secondary | ICD-10-CM | POA: Insufficient documentation

## 2011-04-02 DIAGNOSIS — R112 Nausea with vomiting, unspecified: Secondary | ICD-10-CM | POA: Insufficient documentation

## 2011-04-02 DIAGNOSIS — A5400 Gonococcal infection of lower genitourinary tract, unspecified: Secondary | ICD-10-CM | POA: Insufficient documentation

## 2011-04-02 DIAGNOSIS — A549 Gonococcal infection, unspecified: Secondary | ICD-10-CM

## 2011-04-02 DIAGNOSIS — A609 Anogenital herpesviral infection, unspecified: Secondary | ICD-10-CM

## 2011-04-02 DIAGNOSIS — R197 Diarrhea, unspecified: Secondary | ICD-10-CM | POA: Insufficient documentation

## 2011-04-02 DIAGNOSIS — Z79899 Other long term (current) drug therapy: Secondary | ICD-10-CM | POA: Insufficient documentation

## 2011-04-02 LAB — COMPREHENSIVE METABOLIC PANEL
AST: 22 U/L (ref 0–37)
Albumin: 3.6 g/dL (ref 3.5–5.2)
Alkaline Phosphatase: 70 U/L (ref 39–117)
Chloride: 103 mEq/L (ref 96–112)
Potassium: 3.8 mEq/L (ref 3.5–5.1)
Sodium: 139 mEq/L (ref 135–145)
Total Bilirubin: 0.2 mg/dL — ABNORMAL LOW (ref 0.3–1.2)
Total Protein: 7.1 g/dL (ref 6.0–8.3)

## 2011-04-02 LAB — DIFFERENTIAL
Basophils Absolute: 0.1 10*3/uL (ref 0.0–0.1)
Eosinophils Absolute: 0 10*3/uL (ref 0.0–0.7)
Eosinophils Relative: 0 % (ref 0–5)
Lymphs Abs: 1.6 10*3/uL (ref 0.7–4.0)
Monocytes Absolute: 0.9 10*3/uL (ref 0.1–1.0)
Neutrophils Relative %: 71 % (ref 43–77)

## 2011-04-02 LAB — CBC
MCHC: 34.5 g/dL (ref 30.0–36.0)
Platelets: 222 10*3/uL (ref 150–400)
RDW: 12.8 % (ref 11.5–15.5)
WBC: 8.8 10*3/uL (ref 4.0–10.5)

## 2011-04-02 LAB — HEPATITIS B SURFACE ANTIGEN: Hepatitis B Surface Ag: NEGATIVE

## 2011-04-02 LAB — URINALYSIS, ROUTINE W REFLEX MICROSCOPIC
Bilirubin Urine: NEGATIVE
Nitrite: NEGATIVE
Specific Gravity, Urine: 1.025 (ref 1.005–1.030)
pH: 6.5 (ref 5.0–8.0)

## 2011-04-02 LAB — GC/CHLAMYDIA PROBE AMP, GENITAL
Chlamydia, DNA Probe: UNDETERMINED
GC Probe Amp, Genital: POSITIVE — AB

## 2011-04-02 LAB — URINE MICROSCOPIC-ADD ON

## 2011-04-02 LAB — LIPASE, BLOOD: Lipase: 19 U/L (ref 11–59)

## 2011-04-02 LAB — POCT PREGNANCY, URINE: Preg Test, Ur: NEGATIVE

## 2011-04-02 LAB — RPR: RPR Ser Ql: NONREACTIVE

## 2011-04-02 LAB — HIV ANTIBODY (ROUTINE TESTING W REFLEX): HIV: NONREACTIVE

## 2011-04-02 MED ORDER — VALACYCLOVIR HCL 1 G PO TABS: 1000.0000 mg | ORAL_TABLET | Freq: Two times a day (BID) | ORAL | Status: DC

## 2011-04-02 MED ORDER — AZITHROMYCIN 1 G PO PACK
2.0000 g | PACK | Freq: Once | ORAL | Status: AC
  Administered 2011-04-02: 2 g via ORAL
  Filled 2011-04-02: qty 2

## 2011-04-02 MED ORDER — VALACYCLOVIR HCL 500 MG PO TABS
1000.0000 mg | ORAL_TABLET | Freq: Once | ORAL | Status: AC
  Administered 2011-04-02: 1000 mg via ORAL
  Filled 2011-04-02: qty 2

## 2011-04-02 NOTE — ED Notes (Signed)
Pt crying and upset about testing positive for gonnorhea and being tested for HSV;

## 2011-04-02 NOTE — Progress Notes (Signed)
Was seen at [facility] ED 3 days ago told had BV and has viral infection, taking Ibuprofen, feel like blister areas on vagina, hurts with urination, has HPV,, still running fever

## 2011-04-02 NOTE — ED Provider Notes (Signed)
History   Patient presented c/o continuing lower abdominal pain and blisters on the vulva.  Was seen at ER on 9/12 for same complaint, with cultures, wet prep done, and treatment for BV.  Had pap at [facility] on 8/29, then scheduled an appointment for evaluation of above complaints at [facility] on 9/13, but had been seen in ER the day before, so didn't feel she needed to keep the appointment.  Per ER records, patient had +GC from that ER visit--she was unaware of this before today's MAU visit. No history of HSV in past.  Chlamydia negative from that visit.  Chief Complaint  Patient presents with  . Abdominal Pain  . Vaginal Pain  . Fever   HPI--see above  Pertinent Gynecological History: Menses: flow is moderate Bleeding: No IM bleeding Contraception: none--has new partner, had been abstinent DES exposure: denies Blood transfusions: none Sexually transmitted diseases: past history: GC Previous GYN Procedures: None  Last mammogram: None  Last pap: Done on 8/31--Pending   ALLERGIES:  PCN (SOB reaction)  Past Medical History  Diagnosis Date  . Asthma   . Anemia   . Hypertension   . Heart murmur     Past Surgical History  Procedure Date  . Ganglion cyst excision     No family history on file.  History  Substance Use Topics  . Smoking status: Current Some Day Smoker  . Smokeless tobacco: Not on file  . Alcohol Use: Yes    Allergies:  Allergies  Allergen Reactions  . Penicillins Anaphylaxis  . Caffeine     Heart murmer     No prescriptions prior to admission  On Flagyl for BV  Review of Systems  Constitutional: Negative.   HENT: Negative.   Eyes: Negative.   Respiratory: Negative.   Gastrointestinal: Abdominal pain: pain in lower abdomen.  Genitourinary: Negative.   Musculoskeletal: Negative.   Skin: Negative.   Neurological: Negative.   Endo/Heme/Allergies: Negative.   Psychiatric/Behavioral: Negative.    Physical Exam   Blood pressure 119/71, pulse 60,  temperature 99 F (37.2 C), temperature source Oral, resp. rate 18, height 4' 11"  (1.499 m), weight 82.192 kg (181 lb 3.2 oz), last menstrual period 03/17/2011.  Physical Exam  Constitutional: She is oriented to person, place, and time. She appears well-developed and well-nourished.  HENT:  Head: Normocephalic.  Eyes: Pupils are equal, round, and reactive to light.  Neck: Normal range of motion.  Cardiovascular: Normal rate.   Respiratory: Effort normal.  GI: Soft. Bowel sounds are normal.  Genitourinary: Vaginal discharge: Moderate thin vaginal discharge.  Musculoskeletal: Normal range of motion.  Neurological: She is alert and oriented to person, place, and time. She has normal reflexes.  Skin: Skin is warm and dry.  Psychiatric: She has a normal mood and affect.  Vulva--one pinpoint ulceration on left labia with sensitivity, one pinpoint spot of sensitivity on right labia, but no obvious lesions at that site. Uterus NT, no CMT  MAU Course  Procedures Pelvic exam HSV culture of left labia lesion HSV 1 &2 IGG HIV RPR HBSAG   Assessment and Plan  Assessment: Recent diagnosis of GC, not yet treated Presumptive HSV lesion, initial outbreak PCN allergy (SOB), no hx of cephalosporin use Per consult with [place], will treat GC with Azithromycin 2 gm dose x 1, with plan made for TOC in 3 weeks--dose given in MAU. Rx Valtrex 1000 mg po BID x 10 days, with first dose in MAU. Follow-up on test results from today with  office. Support to patient for issues--she will follow-up with partner, declines partner treatment by [facility]. Follow-up with [facility] in 3 weeks for TOC, since treated with alternative regimen for GC. Patient agreeable with plan.   Draden Cottingham L 04/02/2011, 5:09 PM

## 2011-04-02 NOTE — ED Notes (Signed)
Pt tolerated her medicines and did not vomit after taking them;

## 2011-04-03 NOTE — ED Notes (Signed)
+   gonorrhea. Chart sent to [place] office for review

## 2011-04-05 LAB — HSV(HERPES SIMPLEX VRS) I + II AB-IGM: Herpes Simplex Vrs I&II-IgM Ab (EIA): 0.34 INDEX

## 2011-04-05 LAB — HERPES SIMPLEX VIRUS CULTURE

## 2011-04-14 NOTE — ED Notes (Signed)
Spoke w/pt.  ID verified x 2.  Pt informed of dx and need for tx.  Pt stated was seen at [facility] 9/14 and was informed of (+) gonorrhea dx and was txd.  [place] visit shows pt was treated with Zithromax 2 grams po, 1 x dose.

## 2011-04-15 LAB — WET PREP, GENITAL: Yeast Wet Prep HPF POC: NONE SEEN

## 2011-04-15 LAB — GC/CHLAMYDIA PROBE AMP, GENITAL: Chlamydia, DNA Probe: NEGATIVE

## 2011-04-15 LAB — URINALYSIS, ROUTINE W REFLEX MICROSCOPIC
Glucose, UA: NEGATIVE
Hgb urine dipstick: NEGATIVE
Protein, ur: NEGATIVE
Specific Gravity, Urine: 1.015
Urobilinogen, UA: 0.2

## 2011-04-15 LAB — DIFFERENTIAL
Basophils Absolute: 0
Lymphocytes Relative: 21
Lymphs Abs: 2
Neutro Abs: 6.7
Neutrophils Relative %: 69

## 2011-04-15 LAB — CBC
HCT: 32.2 — ABNORMAL LOW
Platelets: 364
RDW: 19 — ABNORMAL HIGH
WBC: 9.6

## 2011-04-20 LAB — CBC
HCT: 28.3 — ABNORMAL LOW
HCT: 30.3 — ABNORMAL LOW
HCT: 31.4 — ABNORMAL LOW
Hemoglobin: 10.1 — ABNORMAL LOW
Hemoglobin: 10.3 — ABNORMAL LOW
Hemoglobin: 8.6 — ABNORMAL LOW
Hemoglobin: 8.6 — ABNORMAL LOW
Hemoglobin: 9 — ABNORMAL LOW
Hemoglobin: 9.1 — ABNORMAL LOW
Hemoglobin: 9.8 — ABNORMAL LOW
MCHC: 31.5
MCHC: 32.2
MCHC: 32.4
MCV: 79.3
MCV: 79.3
Platelets: 362
Platelets: 366
RBC: 3.35 — ABNORMAL LOW
RBC: 3.37 — ABNORMAL LOW
RBC: 3.56 — ABNORMAL LOW
RBC: 3.93
RDW: 16.2 — ABNORMAL HIGH
RDW: 16.3 — ABNORMAL HIGH
RDW: 17 — ABNORMAL HIGH
RDW: 17.4 — ABNORMAL HIGH
WBC: 12.7 — ABNORMAL HIGH
WBC: 13 — ABNORMAL HIGH
WBC: 15 — ABNORMAL HIGH

## 2011-04-20 LAB — URINALYSIS, ROUTINE W REFLEX MICROSCOPIC
Bilirubin Urine: NEGATIVE
Glucose, UA: NEGATIVE
Glucose, UA: NEGATIVE
Ketones, ur: NEGATIVE
Ketones, ur: NEGATIVE
Leukocytes, UA: NEGATIVE
Nitrite: NEGATIVE
Protein, ur: 30 — AB
Protein, ur: NEGATIVE
Specific Gravity, Urine: 1.025
Urobilinogen, UA: 0.2
pH: 6.5
pH: 6.5

## 2011-04-20 LAB — URINE CULTURE
Culture: NO GROWTH
Culture: NO GROWTH

## 2011-04-20 LAB — DIC (DISSEMINATED INTRAVASCULAR COAGULATION)PANEL
Fibrinogen: 396
Platelets: 344
Smear Review: NONE SEEN
aPTT: 27

## 2011-04-20 LAB — COMPREHENSIVE METABOLIC PANEL
ALT: 17
ALT: 22
ALT: 28
Alkaline Phosphatase: 114
Alkaline Phosphatase: 85
Alkaline Phosphatase: 97
BUN: 1 — ABNORMAL LOW
BUN: 4 — ABNORMAL LOW
BUN: <1 — ABNORMAL LOW
CO2: 27
CO2: 28
Calcium: 8.2 — ABNORMAL LOW
Chloride: 109
Chloride: 110
Creatinine, Ser: 0.67
Creatinine, Ser: 0.74
GFR calc non Af Amer: >60
GFR calc non Af Amer: >60
Glucose, Bld: 112 — ABNORMAL HIGH
Glucose, Bld: 76
Glucose, Bld: 91
Glucose, Bld: 95
Potassium: 3.4 — ABNORMAL LOW
Potassium: 3.6
Potassium: 4
Sodium: 139
Sodium: 139
Sodium: 143
Total Bilirubin: 0.1 — ABNORMAL LOW
Total Bilirubin: 0.3
Total Protein: 6.1

## 2011-04-20 LAB — URIC ACID
Uric Acid, Serum: 5
Uric Acid, Serum: 5.2

## 2011-04-20 LAB — MAGNESIUM: Magnesium: 4.9 — ABNORMAL HIGH

## 2011-04-20 LAB — DIFFERENTIAL
Basophils Absolute: 0.1
Basophils Relative: 1
Eosinophils Absolute: 0.1
Monocytes Absolute: 1
Monocytes Relative: 8
Neutro Abs: 8.7 — ABNORMAL HIGH
Neutrophils Relative %: 69

## 2011-04-20 LAB — ANA: Anti Nuclear Antibody(ANA): NEGATIVE

## 2011-04-20 LAB — PROTEIN C, TOTAL: Protein C, Total: 62 % — ABNORMAL LOW (ref 70–140)

## 2011-04-20 LAB — GC/CHLAMYDIA PROBE AMP, GENITAL
Chlamydia, DNA Probe: NEGATIVE
GC Probe Amp, Genital: NEGATIVE

## 2011-04-20 LAB — WET PREP, GENITAL: Clue Cells Wet Prep HPF POC: NONE SEEN

## 2011-04-20 LAB — RPR: RPR Ser Ql: NONREACTIVE

## 2011-04-20 LAB — URINE MICROSCOPIC-ADD ON

## 2011-04-20 LAB — CULTURE, BETA STREP (GROUP B ONLY)

## 2011-04-20 LAB — LACTATE DEHYDROGENASE
LDH: 222
LDH: 267 — ABNORMAL HIGH

## 2011-04-20 LAB — TORCH TITERS-IGG(TOXO/ RUB/ CMV/ HSV)
Rubella IgG Scr: 42 IU/mL
Toxoplasma IgG Antibody (EIA): <5 IU/mL

## 2011-04-20 LAB — LUPUS ANTICOAGULANT PANEL: PTT Lupus Anticoagulant: 48.2 (ref 36.3–48.8)

## 2011-04-20 LAB — FACTOR 5 LEIDEN

## 2011-04-20 LAB — PROTEIN S, TOTAL: Protein S Ag, Total: 80 % (ref 70–140)

## 2011-04-20 LAB — APTT: aPTT: 29

## 2011-04-20 LAB — HOMOCYSTEINE: Homocysteine: 10.5

## 2011-04-20 LAB — DRUG SCREEN PANEL (SERUM)
Barbiturate Scrn: NEGATIVE
Methadone (Dolophine), Serum: NEGATIVE
Phencyclidine, Serum: NEGATIVE

## 2011-04-20 LAB — CARDIOLIPIN ANTIBODIES, IGG, IGM, IGA
Anticardiolipin IgA: 11 — ABNORMAL LOW (ref <13)
Anticardiolipin IgM: <7 — ABNORMAL LOW (ref <10)

## 2011-04-20 LAB — PROTEIN C ACTIVITY: Protein C Activity: 65 % — ABNORMAL LOW (ref 75–133)

## 2011-04-30 LAB — URINALYSIS, ROUTINE W REFLEX MICROSCOPIC
Glucose, UA: NEGATIVE
Glucose, UA: NEGATIVE
Nitrite: NEGATIVE
Protein, ur: NEGATIVE
Specific Gravity, Urine: >1.03 — ABNORMAL HIGH
pH: 6

## 2011-04-30 LAB — URINE CULTURE: Colony Count: NO GROWTH

## 2011-04-30 LAB — DIFFERENTIAL
Basophils Absolute: 0
Basophils Relative: 0
Eosinophils Absolute: 0
Eosinophils Relative: 1
Lymphocytes Relative: 14
Lymphocytes Relative: 22
Lymphs Abs: 2.7
Monocytes Absolute: 0.6
Monocytes Relative: 7
Neutrophils Relative %: 72

## 2011-04-30 LAB — BASIC METABOLIC PANEL
CO2: 27
GFR calc Af Amer: >60
Glucose, Bld: 98
Potassium: 3.6
Sodium: 138

## 2011-04-30 LAB — URINE MICROSCOPIC-ADD ON

## 2011-04-30 LAB — POCT CARDIAC MARKERS
CKMB, poc: <1 — ABNORMAL LOW
Operator id: 4533
Troponin i, poc: <0.05

## 2011-04-30 LAB — GC/CHLAMYDIA PROBE AMP, GENITAL
Chlamydia, DNA Probe: NEGATIVE
GC Probe Amp, Genital: POSITIVE — AB

## 2011-04-30 LAB — HEPATIC FUNCTION PANEL
Albumin: 3.7
Alkaline Phosphatase: 89
Total Bilirubin: 0.4
Total Protein: 7.1

## 2011-04-30 LAB — CBC
HCT: 34.5 — ABNORMAL LOW
Hemoglobin: 11 — ABNORMAL LOW
MCHC: 31.8
MCV: 78.2
Platelets: 418 — ABNORMAL HIGH
RBC: 4.49
RBC: 4.67
RDW: 18.8 — ABNORMAL HIGH
WBC: 11.4 — ABNORMAL HIGH
WBC: 12 — ABNORMAL HIGH

## 2011-04-30 LAB — POCT PREGNANCY, URINE: Preg Test, Ur: NEGATIVE

## 2011-04-30 LAB — LIPASE, BLOOD: Lipase: 16

## 2011-04-30 LAB — APTT: aPTT: 28

## 2011-06-19 ENCOUNTER — Emergency Department (HOSPITAL_BASED_OUTPATIENT_CLINIC_OR_DEPARTMENT_OTHER)
Admission: EM | Admit: 2011-06-19 | Discharge: 2011-06-20 | Disposition: A | Discharge status: Home / Self Care | Payer: Medicaid Other | Attending: Emergency Medicine | Admitting: Emergency Medicine

## 2011-06-19 ENCOUNTER — Emergency Department (INDEPENDENT_AMBULATORY_CARE_PROVIDER_SITE_OTHER): Payer: Medicaid Other

## 2011-06-19 ENCOUNTER — Encounter (HOSPITAL_BASED_OUTPATIENT_CLINIC_OR_DEPARTMENT_OTHER): Payer: Self-pay | Admitting: *Deleted

## 2011-06-19 DIAGNOSIS — R52 Pain, unspecified: Secondary | ICD-10-CM

## 2011-06-19 DIAGNOSIS — R509 Fever, unspecified: Secondary | ICD-10-CM

## 2011-06-19 DIAGNOSIS — R05 Cough: Secondary | ICD-10-CM

## 2011-06-19 DIAGNOSIS — I10 Essential (primary) hypertension: Secondary | ICD-10-CM | POA: Insufficient documentation

## 2011-06-19 DIAGNOSIS — J45909 Unspecified asthma, uncomplicated: Secondary | ICD-10-CM | POA: Insufficient documentation

## 2011-06-19 DIAGNOSIS — J111 Influenza due to unidentified influenza virus with other respiratory manifestations: Secondary | ICD-10-CM | POA: Insufficient documentation

## 2011-06-19 DIAGNOSIS — F17200 Nicotine dependence, unspecified, uncomplicated: Secondary | ICD-10-CM | POA: Insufficient documentation

## 2011-06-19 DIAGNOSIS — R0989 Other specified symptoms and signs involving the circulatory and respiratory systems: Secondary | ICD-10-CM

## 2011-06-19 LAB — PREGNANCY, URINE: Preg Test, Ur: NEGATIVE

## 2011-06-19 MED ORDER — IBUPROFEN 100 MG/5ML PO SUSP
ORAL | Status: AC
  Administered 2011-06-19: 800 mg via ORAL
  Filled 2011-06-19: qty 40

## 2011-06-19 MED ORDER — ACETAMINOPHEN 325 MG PO TABS
650.0000 mg | ORAL_TABLET | Freq: Once | ORAL | Status: DC
  Filled 2011-06-19: qty 2

## 2011-06-19 MED ORDER — IBUPROFEN 100 MG/5ML PO SUSP
800.0000 mg | Freq: Once | ORAL | Status: AC
  Administered 2011-06-19: 800 mg via ORAL

## 2011-06-19 MED ORDER — HYDROCODONE-ACETAMINOPHEN 5-325 MG PO TABS
1.0000 | ORAL_TABLET | Freq: Once | ORAL | Status: AC
  Administered 2011-06-19: 1 via ORAL
  Filled 2011-06-19: qty 1

## 2011-06-19 NOTE — ED Notes (Signed)
Pt states she has had flu-like s/s x 2 days

## 2011-06-19 NOTE — ED Provider Notes (Signed)
History     CSN: [id number] Arrival date & time: 06/19/2011  9:15 PM   First MD Initiated Contact with Patient 06/19/11 2124      Chief Complaint  Patient presents with  . Influenza    (Consider location/radiation/quality/duration/timing/severity/associated sxs/prior treatment) Patient is a 25 y.o. female presenting with flu symptoms. The history is provided by the patient. No language interpreter was used.  Influenza This is a new problem. The current episode started yesterday. The problem occurs constantly. The problem has been unchanged. Associated symptoms include coughing, a fever, headaches and myalgias. The symptoms are aggravated by nothing. She has tried acetaminophen for the symptoms.    Past Medical History  Diagnosis Date  . Asthma   . Anemia   . Hypertension   . Heart murmur     Past Surgical History  Procedure Date  . Ganglion cyst excision     History reviewed. No pertinent family history.  History  Substance Use Topics  . Smoking status: Current Some Day Smoker  . Smokeless tobacco: Not on file  . Alcohol Use: Yes    OB History    Grav Para Term Preterm Abortions TAB SAB Ect Mult Living   2 2 1 1      1       Review of Systems  Constitutional: Positive for fever.  Respiratory: Positive for cough.   Musculoskeletal: Positive for myalgias.  Neurological: Positive for headaches.  All other systems reviewed and are negative.    Allergies  Penicillins and Caffeine  Home Medications   Current Outpatient Rx  Name Route Sig Dispense Refill  . ALBUTEROL SULFATE HFA 108 (90 BASE) MCG/ACT IN AERS Inhalation Inhale 2 puffs into the lungs every 4 (four) hours as needed. For wheezing or shortness of breath     . DM-PHENYLEPHRINE-ACETAMINOPHEN 10-5-325 MG/15ML PO LIQD Oral Take 10 mLs by mouth every 6 (six) hours as needed. For cough     . GUAIFENESIN ER 600 MG PO TB12 Oral Take 600 mg by mouth 2 (two) times daily.      Marland Kitchen VALACYCLOVIR HCL 1 G PO TABS  Oral Take 1,000 mg by mouth 2 (two) times daily. For breakouts       BP 137/80  Pulse 116  Temp(Src) 99.5 F (37.5 C) (Oral)  Resp 20  Ht 4' 11"  (1.499 m)  Wt 180 lb (81.647 kg)  BMI 36.36 kg/m2  SpO2 99%  LMP 06/04/2011  Physical Exam  Nursing note and vitals reviewed. Constitutional: She is oriented to person, place, and time. She appears well-developed and well-nourished.  HENT:  Right Ear: External ear normal.  Left Ear: External ear normal.  Nose: Rhinorrhea present.  Mouth/Throat: Posterior oropharyngeal erythema present.  Eyes: Pupils are equal, round, and reactive to light.  Neck: Normal range of motion. Neck supple.  Cardiovascular: Normal rate and regular rhythm.   Pulmonary/Chest: Effort normal and breath sounds normal.  Abdominal: Soft.  Musculoskeletal: Normal range of motion.  Neurological: She is alert and oriented to person, place, and time.  Skin: Skin is warm and dry.  Psychiatric: She has a normal mood and affect.    ED Course  Procedures (including critical care time)   Labs Reviewed  PREGNANCY, URINE   Dg Chest 2 View  06/19/2011  *RADIOLOGY REPORT*  Clinical Data: Cough, congestion, bodyaches and fever.  CHEST - 2 VIEW  Comparison: None.  Findings: The heart, mediastinal, and hilar contours are normal. The lungs are well-expanded and clear. Negative for  pleural effusion. The bony thorax is unremarkable.  IMPRESSION: No acute cardiopulmonary disease  Original Report Authenticated By: Curlene Dolphin, M.D.     1. Influenza       MDM  Likely flu based on symptoms:will treat symptomatically        Glendell Docker, NP 06/20/11 0002

## 2011-06-20 ENCOUNTER — Encounter (HOSPITAL_BASED_OUTPATIENT_CLINIC_OR_DEPARTMENT_OTHER): Payer: Self-pay

## 2011-06-20 MED ORDER — HYDROCOD POLST-CHLORPHEN POLST 10-8 MG/5ML PO LQCR: 5.0000 mL | Freq: Two times a day (BID) | ORAL | Status: DC | PRN

## 2011-06-22 NOTE — ED Provider Notes (Signed)
Evaluation and management procedures were performed by the PA/NP under my supervision/collaboration.   Delora Fuel, MD 79/02/40 9735

## 2011-12-06 ENCOUNTER — Encounter (HOSPITAL_COMMUNITY): Payer: Self-pay | Admitting: *Deleted

## 2011-12-06 ENCOUNTER — Inpatient Hospital Stay (HOSPITAL_COMMUNITY): Payer: Medicaid Other

## 2011-12-06 ENCOUNTER — Inpatient Hospital Stay (HOSPITAL_COMMUNITY)
Admission: AD | Admit: 2011-12-06 | Discharge: 2011-12-06 | Disposition: A | Discharge status: Home / Self Care | Payer: Medicaid Other | Source: Ambulatory Visit | Attending: Obstetrics and Gynecology | Admitting: Obstetrics and Gynecology

## 2011-12-06 DIAGNOSIS — R109 Unspecified abdominal pain: Secondary | ICD-10-CM

## 2011-12-06 DIAGNOSIS — N926 Irregular menstruation, unspecified: Secondary | ICD-10-CM | POA: Insufficient documentation

## 2011-12-06 DIAGNOSIS — Z331 Pregnant state, incidental: Secondary | ICD-10-CM

## 2011-12-06 DIAGNOSIS — M545 Low back pain, unspecified: Secondary | ICD-10-CM | POA: Insufficient documentation

## 2011-12-06 LAB — CBC
HCT: 36.6 % (ref 36.0–46.0)
Hemoglobin: 12.3 g/dL (ref 12.0–15.0)
MCH: 30.2 pg (ref 26.0–34.0)
MCHC: 33.6 g/dL (ref 30.0–36.0)
MCV: 89.9 fL (ref 78.0–100.0)
RDW: 12.8 % (ref 11.5–15.5)

## 2011-12-06 LAB — URINALYSIS, ROUTINE W REFLEX MICROSCOPIC
Bilirubin Urine: NEGATIVE
Hgb urine dipstick: NEGATIVE
Nitrite: NEGATIVE
Protein, ur: NEGATIVE mg/dL
Specific Gravity, Urine: 1.015 (ref 1.005–1.030)
Urobilinogen, UA: 0.2 mg/dL (ref 0.0–1.0)

## 2011-12-06 MED ORDER — ONDANSETRON 8 MG PO TBDP: 8.0000 mg | ORAL_TABLET | Freq: Three times a day (TID) | ORAL | Status: AC | PRN

## 2011-12-06 MED ORDER — METRONIDAZOLE 500 MG PO TABS: 500.0000 mg | ORAL_TABLET | Freq: Two times a day (BID) | ORAL | Status: AC

## 2011-12-06 NOTE — Discharge Instructions (Signed)
Morning Sickness Morning sickness is when you feel sick to your stomach (nauseous) during pregnancy. This nauseous feeling may or may not come with throwing up (vomiting). It often occurs in the morning, but can be a problem any time of day. While morning sickness is unpleasant, it is usually harmless unless you develop severe and continual vomiting (hyperemesis gravidarum). This condition requires more intense treatment. CAUSES  The cause of morning sickness is not completely known but seems to be related to a sudden increase of two hormones:   Human chorionic gonadotropin (hCG).   Estrogen hormone.  These are elevated in the first part of the pregnancy. TREATMENT  Do not use any medicines (prescription, over-the-counter, or herbal) for morning sickness without first talking to your caregiver. Some patients are helped by the following:  Vitamin B6 (18m every 8 hours) or vitamin B6 shots.   An antihistamine called doxylamine (18mevery 8 hours).   The herbal medication ginger.  HOME CARE INSTRUCTIONS   Taking multivitamins before getting pregnant can prevent or decrease the severity of morning sickness in most women.   Eat a piece of dry toast or unsalted crackers before getting out of bed in the morning.   Eat 5 or 6 small meals a day.   Eat dry and bland foods (rice, baked potato).   Do not drink liquids with your meals. Drink liquids between meals.   Avoid greasy, fatty, and spicy foods.   Get someone to cook for you if the smell of any food causes nausea and vomiting.   Avoid vitamin pills with iron because iron can cause nausea.   Snack on protein foods between meals if you are hungry.   Eat unsweetened gelatins for deserts.   Wear an acupressure wristband (worn for sea sickness) may be helpful.   Acupuncture may be helpful.   Do not smoke.   Get a humidifier to keep the air in your house free of odors.  SEEK MEDICAL CARE IF:   Your home remedies are not working  and you need medication.   You feel dizzy or lightheaded.   You are losing weight.   You need help with your diet.  SEEK IMMEDIATE MEDICAL CARE IF:   You have persistent and uncontrolled nausea and vomiting.   You pass out (faint).   You have a fever.  MAKE SURE YOU:   Understand these instructions.   Will watch your condition.   Will get help right away if you are not doing well or get worse.  Document Released: 08/26/2006 Document Revised: 06/24/2011 Document Reviewed: 06/23/2007 [facility] Information 2012 [place] A Antepartum care is very important. Be sure you see your doctor and get prenatal care as soon as you think you are pregnant. At this time, you will be tested for infection, genetic abnormalities and potential problems with you and the pregnancy. This is the time to discuss diet, exercise, work, medications, labor, pain medication during labor and the possibility of a cesarean delivery. Ask any questions that may concern you. It is important to see your doctor regularly throughout your pregnancy. Avoid exposure to toxic substances and chemicals - such as cleaning solvents, lead and mercury, some insecticides, and paint. Pregnant women should avoid exposure to paint fumes, and fumes that cause you to feel ill, dizzy or faint. When possible, it is a good idea to have a pre-pregnancy consultation with your caregiver to begin some important recommendations your caregiver suggests such as, taking folic acid,  exercising, quitting smoking, avoiding alcoholic beverages, etc. B Breastfeeding is the healthiest choice for both you and your baby. It has many nutritional benefits for the baby and health benefits for the mother. It also creates a very tight and loving bond between the baby and mother. Talk to your doctor, your family and friends, and your employer about how you choose to feed your baby and how they can support you in your decision. Not all  birth defects can be prevented, but a woman can take actions that may increase her chance of having a healthy baby. Many birth defects happen very early in pregnancy, sometimes before a woman even knows she is pregnant. Birth defects or abnormalities of any child in your or the father's family should be discussed with your caregiver. Get a good support bra as your breast size changes. Wear it especially when you exercise and when nursing.  C Celebrate the news of your pregnancy with the your spouse/father and family. Childbirth classes are helpful to take for you and the spouse/father because it helps to understand what happens during the pregnancy, labor and delivery. Cesarean delivery should be discussed with your doctor so you are prepared for that possibility. The pros and cons of circumcision if it is a boy, should be discussed with your pediatrician. Cigarette smoking during pregnancy can result in low birth weight babies. It has been associated with infertility, miscarriages, tubal pregnancies, infant death (mortality) and poor health (morbidity) in childhood. Additionally, cigarette smoking may cause long-term learning disabilities. If you smoke, you should try to quit before getting pregnant and not smoke during the pregnancy. Secondary smoke may also harm a mother and her developing baby. It is a good idea to ask people to stop smoking around you during your pregnancy and after the baby is born. Extra calcium is necessary when you are pregnant and is found in your prenatal vitamin, in dairy products, green leafy vegetables and in calcium supplements. D A healthy diet according to your current weight and height, along with vitamins and mineral supplements should be discussed with your caregiver. Domestic abuse or violence should be made known to your doctor right away to get the situation corrected. Drink more water when you exercise to keep hydrated. Discomfort of your back and legs usually develops  and progresses from the middle of the second trimester through to delivery of the baby. This is because of the enlarging baby and uterus, which may also affect your balance. Do not take illegal drugs. Illegal drugs can seriously harm the baby and you. Drink extra fluids (water is best) throughout pregnancy to help your body keep up with the increases in your blood volume. Drink at least 6 to 8 glasses of water, fruit juice, or milk each day. A good way to know you are drinking enough fluid is when your urine looks almost like clear water or is very light yellow.  E Eat healthy to get the nutrients you and your unborn baby need. Your meals should include the five basic food groups. Exercise (30 minutes of light to moderate exercise a day) is important and encouraged during pregnancy, if there are no medical problems or problems with the pregnancy. Exercise that causes discomfort or dizziness should be stopped and reported to your caregiver. Emotions during pregnancy can change from being ecstatic to depression and should be understood by you, your partner and your family. F Fetal screening with ultrasound, amniocentesis and monitoring during pregnancy and labor is common and sometimes  necessary. Take 400 micrograms of folic acid daily both before, when possible, and during the first few months of pregnancy to reduce the risk of birth defects of the brain and spine. All women who could possibly become pregnant should take a vitamin with folic acid, every day. It is also important to eat a healthy diet with fortified foods (enriched grain products, including cereals, rice, breads, and pastas) and foods with natural sources of folate (orange juice, green leafy vegetables, beans, peanuts, broccoli, asparagus, peas, and lentils). The father should be involved with all aspects of the pregnancy including, the prenatal care, childbirth classes, labor, delivery, and postpartum time. Fathers may also have emotional  concerns about being a father, financial needs, and raising a family. G Genetic testing should be done appropriately. It is important to know your family and the father's history. If there have been problems with pregnancies or birth defects in your family, report these to your doctor. Also, genetic counselors can talk with you about the information you might need in making decisions about having a family. You can call a major medical center in your area for help in finding a board-certified genetic counselor. Genetic testing and counseling should be done before pregnancy when possible, especially if there is a history of problems in the mother's or father's family. Certain ethnic backgrounds are more at risk for genetic defects. H Get familiar with the hospital where you will be having your baby. Get to know how long it takes to get there, the labor and delivery area, and the hospital procedures. Be sure your medical insurance is accepted there. Get your home ready for the baby including, clothes, the baby's room (when possible), furniture and car seat. Hand washing is important throughout the day, especially after handling raw meat and poultry, changing the baby's diaper or using the bathroom. This can help prevent the spread of many bacteria and viruses that cause infection. Your hair may become dry and thinner, but will return to normal a few weeks after the baby is born. Heartburn is a common problem that can be treated by taking antacids recommended by your caregiver, eating smaller meals 5 or 6 times a day, not drinking liquids when eating, drinking between meals and raising the head of your bed 2 to 3 inches. I Insurance to cover you, the baby, doctor and hospital should be reviewed so that you will be prepared to pay any costs not covered by your insurance plan. If you do not have medical insurance, there are usually clinics and services available for you in your community. Take 30 milligrams of iron  during your pregnancy as prescribed by your doctor to reduce the risk of low red blood cells (anemia) later in pregnancy. All women of childbearing age should eat a diet rich in iron. J There should be a joint effort for the mother, father and any other children to adapt to the pregnancy financially, emotionally, and psychologically during the pregnancy. Join a support group for moms-to-be. Or, join a class on parenting or childbirth. Have the family participate when possible. K Know your limits. Let your caregiver know if you experience any of the following:   Pain of any kind.   Strong cramps.   You develop a lot of weight in a short period of time (5 pounds in 3 to 5 days).   Vaginal bleeding, leaking of amniotic fluid.   Headache, vision problems.   Dizziness, fainting, shortness of breath.   Chest pain.  Fever of 102 F (38.9 C) or higher.   Gush of clear fluid from your vagina.   Painful urination.   Domestic violence.   Irregular heartbeat (palpitations).   Rapid beating of the heart (tachycardia).   Constant feeling sick to your stomach (nauseous) and vomiting.   Trouble walking, fluid retention (edema).   Muscle weakness.   If your baby has decreased activity.   Persistent diarrhea.   Abnormal vaginal discharge.   Uterine contractions at 20-minute intervals.   Back pain that travels down your leg.  L Learn and practice that what you eat and drink should be in moderation and healthy for you and your baby. Legal drugs such as alcohol and caffeine are important issues for pregnant women. There is no safe amount of alcohol a woman can drink while pregnant. Fetal alcohol syndrome, a disorder characterized by growth retardation, facial abnormalities, and central nervous system dysfunction, is caused by a woman's use of alcohol during pregnancy. Caffeine, found in tea, coffee, soft drinks and chocolate, should also be limited. Be sure to read labels when trying to  cut down on caffeine during pregnancy. More than 200 foods, beverages, and over-the-counter medications contain caffeine and have a high salt content! There are coffees and teas that do not contain caffeine. M Medical conditions such as diabetes, epilepsy, and high blood pressure should be treated and kept under control before pregnancy when possible, but especially during pregnancy. Ask your caregiver about any medications that may need to be changed or adjusted during pregnancy. If you are currently taking any medications, ask your caregiver if it is safe to take them while you are pregnant or before getting pregnant when possible. Also, be sure to discuss any herbs or vitamins you are taking. They are medicines, too! Discuss with your doctor all medications, prescribed and over-the-counter, that you are taking. During your prenatal visit, discuss the medications your doctor may give you during labor and delivery. N Never be afraid to ask your doctor or caregiver questions about your health, the progress of the pregnancy, family problems, stressful situations, and recommendation for a pediatrician, if you do not have one. It is better to take all precautions and discuss any questions or concerns you may have during your office visits. It is a good idea to write down your questions before you visit the doctor. O Over-the-counter cough and cold remedies may contain alcohol or other ingredients that should be avoided during pregnancy. Ask your caregiver about prescription, herbs or over-the-counter medications that you are taking or may consider taking while pregnant.  P Physical activity during pregnancy can benefit both you and your baby by lessening discomfort and fatigue, providing a sense of well-being, and increasing the likelihood of early recovery after delivery. Light to moderate exercise during pregnancy strengthens the belly (abdominal) and back muscles. This helps improve posture. Practicing yoga,  walking, swimming, and cycling on a stationary bicycle are usually safe exercises for pregnant women. Avoid scuba diving, exercise at high altitudes (over 3000 feet), skiing, horseback riding, contact sports, etc. Always check with your doctor before beginning any kind of exercise, especially during pregnancy and especially if you did not exercise before getting pregnant. Q Queasiness, stomach upset and morning sickness are common during pregnancy. Eating a couple of crackers or dry toast before getting out of bed. Foods that you normally love may make you feel sick to your stomach. You may need to substitute other nutritious foods. Eating 5 or 6 small meals a  day instead of 3 large ones may make you feel better. Do not drink with your meals, drink between meals. Questions that you have should be written down and asked during your prenatal visits. R Read about and make plans to baby-proof your home. There are important tips for making your home a safer environment for your baby. Review the tips and make your home safer for you and your baby. Read food labels regarding calories, salt and fat content in the food. S Saunas, hot tubs, and steam rooms should be avoided while you are pregnant. Excessive high heat may be harmful during your pregnancy. Your caregiver will screen and examine you for sexually transmitted diseases and genetic disorders during your prenatal visits. Learn the signs of labor. Sexual relations while pregnant is safe unless there is a medical or pregnancy problem and your caregiver advises against it. T Traveling long distances should be avoided especially in the third trimester of your pregnancy. If you do have to travel out of state, be sure to take a copy of your medical records and medical insurance plan with you. You should not travel long distances without seeing your doctor first. Most airlines will not allow you to travel after 36 weeks of pregnancy. Toxoplasmosis is an infection  caused by a parasite that can seriously harm an unborn baby. Avoid eating undercooked meat and handling cat litter. Be sure to wear gloves when gardening. Tingling of the hands and fingers is not unusual and is due to fluid retention. This will go away after the baby is born. U Womb (uterus) size increases during the first trimester. Your kidneys will begin to function more efficiently. This may cause you to feel the need to urinate more often. You may also leak urine when sneezing, coughing or laughing. This is due to the growing uterus pressing against your bladder, which lies directly in front of and slightly under the uterus during the first few months of pregnancy. If you experience burning along with frequency of urination or bloody urine, be sure to tell your doctor. The size of your uterus in the third trimester may cause a problem with your balance. It is advisable to maintain good posture and avoid wearing high heels during this time. An ultrasound of your baby may be necessary during your pregnancy and is safe for you and your baby. V Vaccinations are an important concern for pregnant women. Get needed vaccines before pregnancy. Center for Disease Control (http://www.wolf.info/) has clear guidelines for the use of vaccines during pregnancy. Review the list, be sure to discuss it with your doctor. Prenatal vitamins are helpful and healthy for you and the baby. Do not take extra vitamins except what is recommended. Taking too much of certain vitamins can cause overdose problems. Continuous vomiting should be reported to your caregiver. Varicose veins may appear especially if there is a family history of varicose veins. They should subside after the delivery of the baby. Support hose helps if there is leg discomfort. W Being overweight or underweight during pregnancy may cause problems. Try to get within 15 pounds of your ideal weight before pregnancy. Remember, pregnancy is not a time to be dieting! Do not stop  eating or start skipping meals as your weight increases. Both you and your baby need the calories and nutrition you receive from a healthy diet. Be sure to consult with your doctor about your diet. There is a formula and diet plan available depending on whether you are overweight or underweight. Your  caregiver or nutritionist can help and advise you if necessary. X Avoid X-rays. If you must have dental work or diagnostic tests, tell your dentist or physician that you are pregnant so that extra care can be taken. X-rays should only be taken when the risks of not taking them outweigh the risk of taking them. If needed, only the minimum amount of radiation should be used. When X-rays are necessary, protective lead shields should be used to cover areas of the body that are not being X-rayed. Y Your baby loves you. Breastfeeding your baby creates a loving and very close bond between the two of you. Give your baby a healthy environment to live in while you are pregnant. Infants and children require constant care and guidance. Their health and safety should be carefully watched at all times. After the baby is born, rest or take a nap when the baby is sleeping. Z Get your ZZZs. Be sure to get plenty of rest. Resting on your side as often as possible, especially on your left side is advised. It provides the best circulation to your baby and helps reduce swelling. Try taking a nap for 30 to 45 minutes in the afternoon when possible. After the baby is born rest or take a nap when the baby is sleeping. Try elevating your feet for that amount of time when possible. It helps the circulation in your legs and helps reduce swelling.  Most information courtesy of the CDC. Document Released: 07/05/2005 Document Revised: 06/24/2011 Document Reviewed: 03/19/2009 [facility] Patient Information 2012 [place].

## 2011-12-06 NOTE — MAU Note (Signed)
Patient states she has had irregular periods since having an Implanon removed in January. States she has been having lower abdominal and low back pain for about one week. Has a lot of nausea with occasional vomiting.

## 2011-12-07 LAB — GC/CHLAMYDIA PROBE AMP, GENITAL
Chlamydia, DNA Probe: NEGATIVE
GC Probe Amp, Genital: NEGATIVE

## 2011-12-07 NOTE — Progress Notes (Signed)
Dr. Mancel Bale, pt does not have any scheduled appts. Morgan [place]

## 2012-01-26 LAB — OB RESULTS CONSOLE ABO/RH: RH Type: POSITIVE

## 2012-02-17 ENCOUNTER — Encounter (HOSPITAL_COMMUNITY): Payer: Self-pay | Admitting: *Deleted

## 2012-02-17 ENCOUNTER — Inpatient Hospital Stay (HOSPITAL_COMMUNITY)
Admission: AD | Admit: 2012-02-17 | Discharge: 2012-02-17 | Disposition: A | Discharge status: Home / Self Care | Payer: Medicaid Other | Source: Ambulatory Visit | Attending: Obstetrics | Admitting: Obstetrics

## 2012-02-17 DIAGNOSIS — R102 Pelvic and perineal pain: Secondary | ICD-10-CM

## 2012-02-17 DIAGNOSIS — R197 Diarrhea, unspecified: Secondary | ICD-10-CM | POA: Insufficient documentation

## 2012-02-17 DIAGNOSIS — O99891 Other specified diseases and conditions complicating pregnancy: Secondary | ICD-10-CM | POA: Insufficient documentation

## 2012-02-17 DIAGNOSIS — R109 Unspecified abdominal pain: Secondary | ICD-10-CM | POA: Insufficient documentation

## 2012-02-17 DIAGNOSIS — O26899 Other specified pregnancy related conditions, unspecified trimester: Secondary | ICD-10-CM

## 2012-02-17 HISTORY — DX: Major depressive disorder, single episode, unspecified: F32.9

## 2012-02-17 HISTORY — DX: Depression, unspecified: F32.A

## 2012-02-17 LAB — COMPREHENSIVE METABOLIC PANEL
ALT: 12 U/L (ref 0–35)
Alkaline Phosphatase: 54 U/L (ref 39–117)
BUN: 4 mg/dL — ABNORMAL LOW (ref 6–23)
CO2: 25 mEq/L (ref 19–32)
Chloride: 103 mEq/L (ref 96–112)
GFR calc Af Amer: >90 mL/min (ref >90)
Glucose, Bld: 71 mg/dL (ref 70–99)
Potassium: 3.6 mEq/L (ref 3.5–5.1)
Sodium: 136 mEq/L (ref 135–145)
Total Bilirubin: 0.2 mg/dL — ABNORMAL LOW (ref 0.3–1.2)
Total Protein: 6.4 g/dL (ref 6.0–8.3)

## 2012-02-17 LAB — URINALYSIS, ROUTINE W REFLEX MICROSCOPIC
Bilirubin Urine: NEGATIVE
Leukocytes, UA: NEGATIVE
Nitrite: NEGATIVE
Specific Gravity, Urine: 1.015 (ref 1.005–1.030)
Urobilinogen, UA: 0.2 mg/dL (ref 0.0–1.0)
pH: 7.5 (ref 5.0–8.0)

## 2012-02-17 LAB — CBC WITH DIFFERENTIAL/PLATELET
Eosinophils Absolute: 0.1 10*3/uL (ref 0.0–0.7)
Hemoglobin: 11.4 g/dL — ABNORMAL LOW (ref 12.0–15.0)
Lymphocytes Relative: 30 % (ref 12–46)
Lymphs Abs: 3.2 10*3/uL (ref 0.7–4.0)
Monocytes Relative: 8 % (ref 3–12)
Neutro Abs: 6.6 10*3/uL (ref 1.7–7.7)
Neutrophils Relative %: 61 % (ref 43–77)
Platelets: 245 10*3/uL (ref 150–400)
RBC: 3.7 MIL/uL — ABNORMAL LOW (ref 3.87–5.11)
WBC: 10.7 10*3/uL — ABNORMAL HIGH (ref 4.0–10.5)

## 2012-02-17 LAB — URINE MICROSCOPIC-ADD ON

## 2012-02-17 LAB — WET PREP, GENITAL: Yeast Wet Prep HPF POC: NONE SEEN

## 2012-02-17 MED ORDER — ACETAMINOPHEN 325 MG PO TABS
650.0000 mg | ORAL_TABLET | Freq: Once | ORAL | Status: AC
  Administered 2012-02-17: 650 mg via ORAL
  Filled 2012-02-17: qty 2

## 2012-02-17 NOTE — MAU Provider Note (Signed)
History     CSN: [id number]  Arrival date and time: 02/17/12 1433   First Provider Initiated Contact with Patient 02/17/12 1556      Chief Complaint  Patient presents with  . Abdominal Pain    pt states she has been loose and abdominal cramping this has been going on a little over 2 weeks  . Back Pain   HPI Morgan Newton is a 26 y.o. who presets to MAU for abdominal pain. The pain started about 2 or 3 weeks ago as a cramping pain that comes and goes. She rates the pain as 8/10. Associated symptoms include occasional loose stool, nausea and back pain. Certain positions make the  pain better. Not taking anything for pain.   Prenatal care with Dr. Ruthann Cancer. Hx HSV. The history was provided by the patient.  OB History    Grav Para Term Preterm Abortions TAB SAB Ect Mult Living   3 2 1 1      1       Past Medical History  Diagnosis Date  . Asthma   . Anemia   . Hypertension   . Heart murmur   . Genital herpes   . Depression     Past Surgical History  Procedure Date  . Ganglion cyst excision     No family history on file.  History  Substance Use Topics  . Smoking status: Current Some Day Smoker  . Smokeless tobacco: Not on file  . Alcohol Use: No    Allergies:  Allergies  Allergen Reactions  . Penicillins Shortness Of Breath and Palpitations  . Caffeine Other (See Comments)    Heart murmur    No prescriptions prior to admission    Review of Systems  Constitutional: Negative for fever, chills and weight loss.  HENT: Negative for ear pain, nosebleeds, congestion, sore throat and neck pain.   Eyes: Negative for blurred vision, double vision, photophobia and pain.  Respiratory: Negative for cough, shortness of breath and wheezing.   Cardiovascular: Negative for chest pain, palpitations and leg swelling.  Gastrointestinal: Positive for nausea, abdominal pain and diarrhea. Negative for heartburn, vomiting and constipation.  Genitourinary: Negative for dysuria,  urgency and frequency.  Musculoskeletal: Positive for back pain. Negative for myalgias.  Skin: Negative for itching and rash.  Neurological: Negative for dizziness, sensory change, speech change, seizures, weakness and headaches.  Endo/Heme/Allergies: Does not bruise/bleed easily.  Psychiatric/Behavioral: Negative for depression. The patient is not nervous/anxious.    Physical Exam   Blood pressure 120/68, pulse 62, temperature 98.8 F (37.1 C), temperature source Oral, resp. rate 18, last menstrual period 10/16/2011.  Physical Exam  Constitutional: She is oriented to person, place, and time. She appears well-developed and well-nourished. No distress.  HENT:  Head: Normocephalic and atraumatic.  Eyes: EOM are normal.  Neck: Neck supple.  Cardiovascular: Normal rate.   Respiratory: Effort normal.  GI: Soft. There is tenderness in the right lower quadrant and left lower quadrant. There is no rigidity, no rebound, no guarding and no CVA tenderness.       Positive FHT's.  Genitourinary:       External genitalia without lesions. White discharge vaginal vault. Cervix long, closed, mild CMT, uterus consistent with dates.  Musculoskeletal: Normal range of motion.  Neurological: She is alert and oriented to person, place, and time.  Skin: Skin is warm and dry.  Psychiatric: She has a normal mood and affect. Her behavior is normal. Judgment and thought content normal.  Results for orders placed during the hospital encounter of 02/17/12 (from the past 24 hour(s))  URINALYSIS, ROUTINE W REFLEX MICROSCOPIC     Status: Abnormal   Collection Time   02/17/12  3:30 PM      Component Value Range   Color, Urine YELLOW  YELLOW   APPearance CLEAR  CLEAR   Specific Gravity, Urine 1.015  1.005 - 1.030   pH 7.5  5.0 - 8.0   Glucose, UA NEGATIVE  NEGATIVE mg/dL   Hgb urine dipstick TRACE (*) NEGATIVE   Bilirubin Urine NEGATIVE  NEGATIVE   Ketones, ur NEGATIVE  NEGATIVE mg/dL   Protein, ur NEGATIVE   NEGATIVE mg/dL   Urobilinogen, UA 0.2  0.0 - 1.0 mg/dL   Nitrite NEGATIVE  NEGATIVE   Leukocytes, UA NEGATIVE  NEGATIVE  URINE MICROSCOPIC-ADD ON     Status: Abnormal   Collection Time   02/17/12  3:30 PM      Component Value Range   Squamous Epithelial / LPF FEW (*) RARE   WBC, UA    <3 WBC/hpf   Value: NO FORMED ELEMENTS SEEN ON URINE MICROSCOPIC EXAMINATION   RBC / HPF 3-6  <3 RBC/hpf   Bacteria, UA FEW (*) RARE   Urine-Other MUCOUS PRESENT    CBC WITH DIFFERENTIAL     Status: Abnormal   Collection Time   02/17/12  4:20 PM      Component Value Range   WBC 10.7 (*) 4.0 - 10.5 K/uL   RBC 3.70 (*) 3.87 - 5.11 MIL/uL   Hemoglobin 11.4 (*) 12.0 - 15.0 g/dL   HCT 33.3 (*) 36.0 - 46.0 %   MCV 90.0  78.0 - 100.0 fL   MCH 30.8  26.0 - 34.0 pg   MCHC 34.2  30.0 - 36.0 g/dL   RDW 13.3  11.5 - 15.5 %   Platelets 245  150 - 400 K/uL   Neutrophils Relative 61  43 - 77 %   Neutro Abs 6.6  1.7 - 7.7 K/uL   Lymphocytes Relative 30  12 - 46 %   Lymphs Abs 3.2  0.7 - 4.0 K/uL   Monocytes Relative 8  3 - 12 %   Monocytes Absolute 0.9  0.1 - 1.0 K/uL   Eosinophils Relative 1  0 - 5 %   Eosinophils Absolute 0.1  0.0 - 0.7 K/uL   Basophils Relative 0  0 - 1 %   Basophils Absolute 0.0  0.0 - 0.1 K/uL  COMPREHENSIVE METABOLIC PANEL     Status: Abnormal   Collection Time   02/17/12  4:20 PM      Component Value Range   Sodium 136  135 - 145 mEq/L   Potassium 3.6  3.5 - 5.1 mEq/L   Chloride 103  96 - 112 mEq/L   CO2 25  19 - 32 mEq/L   Glucose, Bld 71  70 - 99 mg/dL   BUN 4 (*) 6 - 23 mg/dL   Creatinine, Ser 0.57  0.50 - 1.10 mg/dL   Calcium 9.1  8.4 - 10.5 mg/dL   Total Protein 6.4  6.0 - 8.3 g/dL   Albumin 3.1 (*) 3.5 - 5.2 g/dL   AST 13  0 - 37 U/L   ALT 12  0 - 35 U/L   Alkaline Phosphatase 54  39 - 117 U/L   Total Bilirubin 0.2 (*) 0.3 - 1.2 mg/dL   GFR calc non Af Amer >90  >90 mL/min  GFR calc Af Amer >90  >90 mL/min  WET PREP, GENITAL     Status: Abnormal   Collection Time    02/17/12  4:40 PM      Component Value Range   Yeast Wet Prep HPF POC NONE SEEN  NONE SEEN   Trich, Wet Prep NONE SEEN  NONE SEEN   Clue Cells Wet Prep HPF POC FEW (*) NONE SEEN   WBC, Wet Prep HPF POC FEW (*) NONE SEEN   Assessment: Abdominal pain in pregnancy   Round ligament pain   Diarrhea  Plan:  B.R.A.T. Diet   Tylenol   Follow up in the office with Dr. Ruthann Cancer, return here as needed  MAU Course: Discussed with Dr. Ruthann Cancer and he agrees with plan of care.  Procedures  Chanze Teagle, RN, FNP, [facility] 02/17/2012, 6:40 PM

## 2012-02-18 LAB — GC/CHLAMYDIA PROBE AMP, GENITAL
Chlamydia, DNA Probe: NEGATIVE
GC Probe Amp, Genital: NEGATIVE

## 2012-03-25 ENCOUNTER — Encounter (HOSPITAL_COMMUNITY): Payer: Self-pay | Admitting: *Deleted

## 2012-03-25 ENCOUNTER — Inpatient Hospital Stay (HOSPITAL_COMMUNITY)
Admission: AD | Admit: 2012-03-25 | Discharge: 2012-03-25 | Disposition: A | Discharge status: Home / Self Care | Payer: Medicaid Other | Source: Ambulatory Visit | Attending: Obstetrics | Admitting: Obstetrics

## 2012-03-25 DIAGNOSIS — Z3689 Encounter for other specified antenatal screening: Secondary | ICD-10-CM

## 2012-03-25 DIAGNOSIS — Z36 Encounter for antenatal screening of mother: Secondary | ICD-10-CM

## 2012-03-25 DIAGNOSIS — O368190 Decreased fetal movements, unspecified trimester, not applicable or unspecified: Secondary | ICD-10-CM

## 2012-03-25 NOTE — MAU Note (Signed)
Pt reports she has not felt baby move today at all. Reports mild cramping as well.

## 2012-03-25 NOTE — MAU Provider Note (Signed)
  History     CSN: [id number]  Arrival date and time: 03/25/12 1323   First Provider Initiated Contact with Patient 03/25/12 1409      Chief Complaint  Patient presents with  . Decreased Fetal Movement   HPI  Pt is a G3P1101 at 23.0 wks here with report of decreased fetal movement today.  Concerned because history of fetal demise at 28 wks.  No report of contractions or vaginal bleeding. +fetal movement since arriving to hospital.    Past Medical History  Diagnosis Date  . Asthma   . Anemia   . Heart murmur   . Genital herpes   . Depression   . Hypertension     PIH  . Gestational diabetes     1st pregnancy    Past Surgical History  Procedure Date  . Ganglion cyst excision     Family History  Problem Relation Age of Onset  . Other Neg Hx     History  Substance Use Topics  . Smoking status: Current Some Day Smoker  . Smokeless tobacco: Not on file  . Alcohol Use: No    Allergies:  Allergies  Allergen Reactions  . Penicillins Shortness Of Breath and Palpitations  . Caffeine Other (See Comments)    Heart murmur    Prescriptions prior to admission  Medication Sig Dispense Refill  . albuterol (PROVENTIL HFA;VENTOLIN HFA) 108 (90 BASE) MCG/ACT inhaler Inhale 2 puffs into the lungs every 4 (four) hours as needed. For wheezing or shortness of breath       . Prenat w/o A FeCbnFeGlu-FA &B6 (CITRANATAL B-CALM) 20-1 & 25 (2) MG MISC Take 1 tablet by mouth daily.        Review of Systems  Constitutional:       Decreased fetal movement  All other systems reviewed and are negative.   Physical Exam   Blood pressure 121/66, pulse 92, temperature 98.7 F (37.1 C), temperature source Oral, resp. rate 18, height 4' 11"  (1.499 m), weight 80.105 kg (176 lb 9.6 oz), last menstrual period 10/16/2011.  Physical Exam  Constitutional: She is oriented to person, place, and time. She appears well-developed and well-nourished.  HENT:  Head: Normocephalic.  Neck: Normal  range of motion. Neck supple.  Cardiovascular: Normal rate, regular rhythm and normal heart sounds.   Respiratory: Effort normal and breath sounds normal.  GI: Soft. There is no tenderness.  Genitourinary: No bleeding around the vagina.  Musculoskeletal: Normal range of motion. She exhibits no edema.  Neurological: She is alert and oriented to person, place, and time.  Skin: Skin is warm and dry.   FHR 130's  MAU Course  Procedures   Assessment and Plan  Reassuring Fetal Well-Being  Plan: Consulted with Dr. Gala Lewandowsky home with reassurance  [facility] 03/25/2012, 2:13 PM

## 2012-03-25 NOTE — MAU Note (Signed)
Pt did not feel baby move today. Pt reports that now she does feel move in MAU. Pt denies LOF or bleeding. But pt reports L sided cramping.

## 2012-04-20 ENCOUNTER — Encounter (HOSPITAL_COMMUNITY): Payer: Self-pay | Admitting: *Deleted

## 2012-04-20 ENCOUNTER — Inpatient Hospital Stay (HOSPITAL_COMMUNITY)
Admission: AD | Admit: 2012-04-20 | Discharge: 2012-04-20 | Disposition: A | Discharge status: Home / Self Care | Payer: Medicaid Other | Source: Ambulatory Visit | Attending: Obstetrics | Admitting: Obstetrics

## 2012-04-20 DIAGNOSIS — K529 Noninfective gastroenteritis and colitis, unspecified: Secondary | ICD-10-CM

## 2012-04-20 DIAGNOSIS — IMO0002 Reserved for concepts with insufficient information to code with codable children: Secondary | ICD-10-CM | POA: Insufficient documentation

## 2012-04-20 DIAGNOSIS — R197 Diarrhea, unspecified: Secondary | ICD-10-CM | POA: Insufficient documentation

## 2012-04-20 DIAGNOSIS — O99891 Other specified diseases and conditions complicating pregnancy: Secondary | ICD-10-CM | POA: Insufficient documentation

## 2012-04-20 DIAGNOSIS — H6120 Impacted cerumen, unspecified ear: Secondary | ICD-10-CM

## 2012-04-20 HISTORY — DX: Acute parametritis and pelvic cellulitis: N73.0

## 2012-04-20 HISTORY — DX: Unspecified infectious disease: B99.9

## 2012-04-20 HISTORY — DX: Gestational (pregnancy-induced) hypertension without significant proteinuria, unspecified trimester: O13.9

## 2012-04-20 HISTORY — DX: Reserved for concepts with insufficient information to code with codable children: IMO0002

## 2012-04-20 LAB — COMPREHENSIVE METABOLIC PANEL
AST: 17 U/L (ref 0–37)
Albumin: 2.8 g/dL — ABNORMAL LOW (ref 3.5–5.2)
Calcium: 8.7 mg/dL (ref 8.4–10.5)
Creatinine, Ser: 0.52 mg/dL (ref 0.50–1.10)
GFR calc non Af Amer: >90 mL/min (ref >90)
Total Protein: 6.5 g/dL (ref 6.0–8.3)

## 2012-04-20 LAB — CBC WITH DIFFERENTIAL/PLATELET
Basophils Absolute: 0 10*3/uL (ref 0.0–0.1)
Basophils Relative: 0 % (ref 0–1)
Eosinophils Absolute: 0.1 10*3/uL (ref 0.0–0.7)
Eosinophils Relative: 1 % (ref 0–5)
HCT: 32.8 % — ABNORMAL LOW (ref 36.0–46.0)
MCHC: 34.1 g/dL (ref 30.0–36.0)
MCV: 90.9 fL (ref 78.0–100.0)
Monocytes Absolute: 1.1 10*3/uL — ABNORMAL HIGH (ref 0.1–1.0)
RDW: 12.8 % (ref 11.5–15.5)

## 2012-04-20 LAB — URINALYSIS, ROUTINE W REFLEX MICROSCOPIC
Nitrite: NEGATIVE
Specific Gravity, Urine: 1.02 (ref 1.005–1.030)
Urobilinogen, UA: 0.2 mg/dL (ref 0.0–1.0)
pH: 7 (ref 5.0–8.0)

## 2012-04-20 LAB — URINE MICROSCOPIC-ADD ON

## 2012-04-20 MED ORDER — LACTINEX PO CHEW: 1.0000 | CHEWABLE_TABLET | Freq: Three times a day (TID) | ORAL | Status: DC

## 2012-04-20 MED ORDER — PSYLLIUM 28 % PO PACK: 1.0000 | PACK | Freq: Two times a day (BID) | ORAL | Status: DC

## 2012-04-20 MED ORDER — CARBAMIDE PEROXIDE 6.5 % OT SOLN: 5.0000 [drp] | Freq: Two times a day (BID) | OTIC | Status: DC

## 2012-04-20 MED ORDER — ACETAMINOPHEN 500 MG PO TABS
1000.0000 mg | ORAL_TABLET | Freq: Four times a day (QID) | ORAL | Status: DC | PRN
  Administered 2012-04-20: 1000 mg via ORAL
  Filled 2012-04-20: qty 2

## 2012-04-20 NOTE — MAU Note (Signed)
Ongoing diarrhea for a couple months.  Been nauseated, headache, just feel worn out and not herself.

## 2012-04-20 NOTE — MAU Note (Signed)
Patient states she has had diarrhea for about 2 months, nausea but no vomiting. Has had a headache for a few hours. Feels weak and short of breath. Denies any bleeding or leaking and reports good fetal movement.

## 2012-04-20 NOTE — MAU Provider Note (Signed)
History     CSN: [id number]  Arrival date and time: 04/20/12 1519   First Provider Initiated Contact with Patient 04/20/12 1625      Chief Complaint  Patient presents with  . Headache  . Diarrhea  . Nausea  . Fatigue   HPI Diarrhea 10 times a day since pregnant (about 2 months). Crampy abdominal pain prior to stools, relieved by stools but then starts cramping again. Does not seem to be related to food. Had constipation early in pregnancy that was never treated. Has not had a formed stool in weeks.  Nausea, no vomiting.   Feels weak, twitchy all over, just doesn't feel well. No fever but does feel cold. Some muscle aches. PICA - was eating baby powder a few days ago.   Right ear feels full/blocked at night. No pain. No nasal drainage or congestion. No sore throat or cough.  No contractions, vaginal bleeding, leak of fluid. Baby moving normally.  OB History    Grav Para Term Preterm Abortions TAB SAB Ect Mult Living   3 2 1 1      1       Past Medical History  Diagnosis Date  . Asthma   . Anemia   . Heart murmur   . Genital herpes   . Depression   . Hypertension     PIH  . Pregnancy induced hypertension   . Gestational diabetes     1st pregnancy-diet controlled  . Infection     urinary tract infection  . PID (acute pelvic inflammatory disease)   . Abnormal Pap smear 2012    [facility]    Past Surgical History  Procedure Date  . Ganglion cyst excision     Family History  Problem Relation Age of Onset  . Other Neg Hx   . Hearing loss Neg Hx   . Hypertension Mother   . Hypertension Maternal Grandmother   . Hypertension Maternal Grandfather     History  Substance Use Topics  . Smoking status: Current Some Day Smoker    Types: Cigarettes  . Smokeless tobacco: Never Used  . Alcohol Use: No    Allergies:  Allergies  Allergen Reactions  . Penicillins Shortness Of Breath and Palpitations  . Caffeine Other (See Comments)    Heart murmur     Prescriptions prior to admission  Medication Sig Dispense Refill  . acetaminophen (TYLENOL) 500 MG tablet Take 500-1,000 mg by mouth every 6 (six) hours as needed. For pain      . albuterol (PROVENTIL HFA;VENTOLIN HFA) 108 (90 BASE) MCG/ACT inhaler Inhale 2 puffs into the lungs every 4 (four) hours as needed. For wheezing or shortness of breath       . OVER THE COUNTER MEDICATION Take 1 tablet by mouth daily. Vitafusion Prenatal Gummies        Review of Systems  Constitutional: Positive for malaise/fatigue. Negative for fever and chills.  Eyes: Negative for blurred vision and double vision.  Respiratory: Negative for cough and shortness of breath.   Cardiovascular: Negative for chest pain and palpitations.  Gastrointestinal: Positive for nausea, abdominal pain, diarrhea and constipation. Negative for vomiting, blood in stool and melena.  Genitourinary: Negative for dysuria, urgency, hematuria and flank pain.  Musculoskeletal: Negative for back pain.  Skin: Negative for rash.  Neurological: Positive for tremors (pt feels twitchy), weakness and headaches. Negative for dizziness.   Physical Exam   Blood pressure 123/87, pulse 88, temperature 98.3 F (36.8 C), temperature source Oral, resp.  rate 16, height 4' 11"  (1.499 m), weight 80.922 kg (178 lb 6.4 oz), last menstrual period 10/16/2011, SpO2 100.00%.  Physical Exam  Constitutional: She is oriented to person, place, and time. She appears well-developed and well-nourished. She appears distressed (looks like she does not feel well).  HENT:  Head: Normocephalic and atraumatic.  Right Ear: External ear normal.  Left Ear: External ear normal.  Mouth/Throat: Oropharynx is clear and moist.       Left TM normal. R TM obscured by cerumen.  Eyes: Conjunctivae normal and EOM are normal.  Neck: Normal range of motion. Neck supple.  Cardiovascular: Normal rate, regular rhythm and normal heart sounds.   Respiratory: Effort normal and breath  sounds normal. No respiratory distress.  GI: Soft. Bowel sounds are normal. She exhibits no distension. There is no tenderness. There is no rebound and no guarding.  Genitourinary:       Rectal tone normal. No stool in vault. No hemorrhoids. Not enough stool for hemoccult.  Musculoskeletal: Normal range of motion. She exhibits no edema and no tenderness.  Neurological: She is alert and oriented to person, place, and time.  Skin: Skin is warm and dry.    MAU Course  Procedures  Results for orders placed during the hospital encounter of 04/20/12 (from the past 24 hour(s))  URINALYSIS, ROUTINE W REFLEX MICROSCOPIC     Status: Abnormal   Collection Time   04/20/12  3:45 PM      Component Value Range   Color, Urine YELLOW  YELLOW   APPearance HAZY (*) CLEAR   Specific Gravity, Urine 1.020  1.005 - 1.030   pH 7.0  5.0 - 8.0   Glucose, UA NEGATIVE  NEGATIVE mg/dL   Hgb urine dipstick SMALL (*) NEGATIVE   Bilirubin Urine NEGATIVE  NEGATIVE   Ketones, ur 15 (*) NEGATIVE mg/dL   Protein, ur NEGATIVE  NEGATIVE mg/dL   Urobilinogen, UA 0.2  0.0 - 1.0 mg/dL   Nitrite NEGATIVE  NEGATIVE   Leukocytes, UA NEGATIVE  NEGATIVE  URINE MICROSCOPIC-ADD ON     Status: Abnormal   Collection Time   04/20/12  3:45 PM      Component Value Range   Squamous Epithelial / LPF RARE  RARE   WBC, UA 0-2  <3 WBC/hpf   RBC / HPF 7-10  <3 RBC/hpf   Bacteria, UA FEW (*) RARE   Urine-Other MUCOUS PRESENT    CBC WITH DIFFERENTIAL     Status: Abnormal   Collection Time   04/20/12  4:40 PM      Component Value Range   WBC 9.6  4.0 - 10.5 K/uL   RBC 3.61 (*) 3.87 - 5.11 MIL/uL   Hemoglobin 11.2 (*) 12.0 - 15.0 g/dL   HCT 32.8 (*) 36.0 - 46.0 %   MCV 90.9  78.0 - 100.0 fL   MCH 31.0  26.0 - 34.0 pg   MCHC 34.1  30.0 - 36.0 g/dL   RDW 12.8  11.5 - 15.5 %   Platelets 265  150 - 400 K/uL   Neutrophils Relative 61  43 - 77 %   Neutro Abs 5.8  1.7 - 7.7 K/uL   Lymphocytes Relative 27  12 - 46 %   Lymphs Abs 2.6   0.7 - 4.0 K/uL   Monocytes Relative 11  3 - 12 %   Monocytes Absolute 1.1 (*) 0.1 - 1.0 K/uL   Eosinophils Relative 1  0 - 5 %   Eosinophils  Absolute 0.1  0.0 - 0.7 K/uL   Basophils Relative 0  0 - 1 %   Basophils Absolute 0.0  0.0 - 0.1 K/uL  COMPREHENSIVE METABOLIC PANEL     Status: Abnormal   Collection Time   04/20/12  4:40 PM      Component Value Range   Sodium 133 (*) 135 - 145 mEq/L   Potassium 3.5  3.5 - 5.1 mEq/L   Chloride 101  96 - 112 mEq/L   CO2 22  19 - 32 mEq/L   Glucose, Bld 71  70 - 99 mg/dL   BUN 4 (*) 6 - 23 mg/dL   Creatinine, Ser 0.52  0.50 - 1.10 mg/dL   Calcium 8.7  8.4 - 10.5 mg/dL   Total Protein 6.5  6.0 - 8.3 g/dL   Albumin 2.8 (*) 3.5 - 5.2 g/dL   AST 17  0 - 37 U/L   ALT 14  0 - 35 U/L   Alkaline Phosphatase 79  39 - 117 U/L   Total Bilirubin 0.2 (*) 0.3 - 1.2 mg/dL   GFR calc non Af Amer >90  >90 mL/min   GFR calc Af Amer >90  >90 mL/min  MAGNESIUM     Status: Normal   Collection Time   04/20/12  4:40 PM      Component Value Range   Magnesium 1.7  1.5 - 2.5 mg/dL   Hemoccult - insufficient specimen.  NST: 135-140, moderate variability, accels present, no decels. TOCO:  No contractions.  Assessment and Plan  26 y.o. G3P1101 at 6w5dwith chronic diarrhea, malaise, muscle twitches. 1.  IUP:  NST reactive. 2.  Diarrhea: May be fecal impaction with overflow vs lactose intolerance, post-viral malabsorption, or IBS vs infectious.  Avoid milk products. Lactinex, metamucil. Consider GI consult. Also consider Thyroid disorder. Electrolytes, magnesium normal.  3.  Twitching/fatigue/weakness. CBC normal. TSH drawn- pending. Doing better after drinking fluids and eating crackers.  4.  Ear fullness - cerumenolytic drops prescribed.   FMartha Clan10/09/2011, 4:27 PM

## 2012-04-20 NOTE — MAU Note (Signed)
No stool noted on hemmocult card. Had told pt if she felt like she was going to have a BM that I would like to try to catch some.

## 2012-04-21 LAB — URINE CULTURE
Colony Count: NO GROWTH
Culture: NO GROWTH
Special Requests: NORMAL

## 2012-05-20 ENCOUNTER — Encounter (HOSPITAL_COMMUNITY): Payer: Self-pay

## 2012-05-20 ENCOUNTER — Inpatient Hospital Stay (HOSPITAL_COMMUNITY)
Admission: AD | Admit: 2012-05-20 | Discharge: 2012-05-20 | Disposition: A | Discharge status: Home / Self Care | Payer: Medicaid Other | Source: Ambulatory Visit | Attending: Obstetrics | Admitting: Obstetrics

## 2012-05-20 DIAGNOSIS — M79609 Pain in unspecified limb: Secondary | ICD-10-CM | POA: Insufficient documentation

## 2012-05-20 DIAGNOSIS — M549 Dorsalgia, unspecified: Secondary | ICD-10-CM | POA: Insufficient documentation

## 2012-05-20 DIAGNOSIS — M25559 Pain in unspecified hip: Secondary | ICD-10-CM | POA: Insufficient documentation

## 2012-05-20 DIAGNOSIS — R319 Hematuria, unspecified: Secondary | ICD-10-CM | POA: Insufficient documentation

## 2012-05-20 DIAGNOSIS — O99891 Other specified diseases and conditions complicating pregnancy: Secondary | ICD-10-CM | POA: Insufficient documentation

## 2012-05-20 DIAGNOSIS — O9989 Other specified diseases and conditions complicating pregnancy, childbirth and the puerperium: Secondary | ICD-10-CM

## 2012-05-20 LAB — URINALYSIS, ROUTINE W REFLEX MICROSCOPIC
Glucose, UA: NEGATIVE mg/dL
Protein, ur: 30 mg/dL — AB

## 2012-05-20 LAB — URINE MICROSCOPIC-ADD ON

## 2012-05-20 MED ORDER — CYCLOBENZAPRINE HCL 10 MG PO TABS: 10.0000 mg | ORAL_TABLET | Freq: Three times a day (TID) | ORAL | Status: DC | PRN

## 2012-05-20 MED ORDER — ACETAMINOPHEN 500 MG PO TABS: 1000.0000 mg | ORAL_TABLET | Freq: Four times a day (QID) | ORAL | Status: DC | PRN

## 2012-05-20 MED ORDER — ACETAMINOPHEN 500 MG PO TABS
1000.0000 mg | ORAL_TABLET | Freq: Four times a day (QID) | ORAL | Status: DC | PRN
  Administered 2012-05-20: 1000 mg via ORAL
  Filled 2012-05-20: qty 2

## 2012-05-20 NOTE — MAU Note (Signed)
Pt reports having  In her top of her thighs and acheness/warm feeling that is better when she walks but strts hurting more when she rests or lays down.  Reports "the baby feels weird inside her" only way she can describe it. Good movement reported.

## 2012-05-20 NOTE — MAU Provider Note (Signed)
History     CSN: [id number]  Arrival date and time: 05/20/12 1203   None     Chief Complaint  Patient presents with  . Leg Pain   HPI 26 y.o. G3P1101 at 41w0dwith pain in hips, knees, legs for 2-3 days. Started on left, now on right as well. Burning, aching down to knees 10/10 at worst, currently 7/10. Improves with walking. Legs go numb when she sits down. Pain worst with lying down at night - tingling. Also c/o back pain/pressure and sharp stomach pain off and on which is worse with walking/standing. Has had feeling of things crawling on her legs last few weeks. Has not taken anything for pain.  No weakness. No bleeding, no loss of fluid. Baby moving okay. No dysuria, some pressure. No constipation. No N/V. No vaginal discharge.   OB History    Grav Para Term Preterm Abortions TAB SAB Ect Mult Living   3 2 1 1      1       Past Medical History  Diagnosis Date  . Asthma   . Anemia   . Heart murmur   . Genital herpes   . Depression   . Hypertension     PIH  . Pregnancy induced hypertension   . Gestational diabetes     1st pregnancy-diet controlled  . Infection     urinary tract infection  . PID (acute pelvic inflammatory disease)   . Abnormal Pap smear 2012    [facility]    Past Surgical History  Procedure Date  . Ganglion cyst excision     Family History  Problem Relation Age of Onset  . Other Neg Hx   . Hearing loss Neg Hx   . Hypertension Mother   . Hypertension Maternal Grandmother   . Hypertension Maternal Grandfather     History  Substance Use Topics  . Smoking status: Current Some Day Smoker    Types: Cigarettes  . Smokeless tobacco: Never Used  . Alcohol Use: No    Allergies:  Allergies  Allergen Reactions  . Penicillins Shortness Of Breath and Palpitations  . Caffeine Other (See Comments)    Heart murmur    Prescriptions prior to admission  Medication Sig Dispense Refill  . calcium carbonate (TUMS - DOSED IN MG ELEMENTAL CALCIUM) 500 MG  chewable tablet Chew 2 tablets by mouth daily as needed. For heartburn      . OVER THE COUNTER MEDICATION Take 1 tablet by mouth daily. Vitafusion Prenatal Gummies      . albuterol (PROVENTIL HFA;VENTOLIN HFA) 108 (90 BASE) MCG/ACT inhaler Inhale 2 puffs into the lungs every 4 (four) hours as needed. For wheezing or shortness of breath         Review of Systems  Constitutional: Negative for fever and chills.  Eyes: Negative for blurred vision and double vision.  Respiratory: Negative for shortness of breath.   Cardiovascular: Negative for chest pain.  Gastrointestinal: Negative for nausea, vomiting, diarrhea and constipation.  Genitourinary: Negative for dysuria and urgency.  Musculoskeletal: Positive for back pain and joint pain.  Neurological: Negative for dizziness and headaches.   Physical Exam   Blood pressure 114/57, pulse 81, temperature 98.4 F (36.9 C), temperature source Oral, resp. rate 18, height 4' 11"  (1.499 m), weight 176 lb 9.6 oz (80.105 kg), last menstrual period 10/16/2011.  Physical Exam  Constitutional: She is oriented to person, place, and time. She appears well-developed and well-nourished.  HENT:  Head: Normocephalic and atraumatic.  Eyes: Conjunctivae normal and EOM are normal.  Neck: Normal range of motion. Neck supple.  Cardiovascular: Normal rate, regular rhythm and normal heart sounds.   Respiratory: Effort normal and breath sounds normal. No respiratory distress.  GI: Soft. There is no tenderness. There is no rebound and no guarding.  Musculoskeletal: Normal range of motion. She exhibits no edema.       Pain with palpation of thighs, knees, hips. No swelling or redness. No edema.  Neurological: She is alert and oriented to person, place, and time.       Sensation equal and grossly intact bilat. Lower extrem strength 5/5 and equal bilaterally.  Skin: Skin is warm and dry.  Psychiatric: She has a normal mood and affect.   Dilation: Fingertip (internal  closed) Effacement:  Thick Cervical Position: Posterior Exam by:: Dr. Christoper Fabian  NST: 135, moderate variability, accels present, no decels. TOCO:  Irritability, no contractions.  Results for orders placed during the hospital encounter of 05/20/12 (from the past 24 hour(s))  URINALYSIS, ROUTINE W REFLEX MICROSCOPIC     Status: Abnormal   Collection Time   05/20/12  1:42 PM      Component Value Range   Color, Urine YELLOW  YELLOW   APPearance HAZY (*) CLEAR   Specific Gravity, Urine 1.025  1.005 - 1.030   pH 6.5  5.0 - 8.0   Glucose, UA NEGATIVE  NEGATIVE mg/dL   Hgb urine dipstick TRACE (*) NEGATIVE   Bilirubin Urine SMALL (*) NEGATIVE   Ketones, ur NEGATIVE  NEGATIVE mg/dL   Protein, ur 30 (*) NEGATIVE mg/dL   Urobilinogen, UA 0.2  0.0 - 1.0 mg/dL   Nitrite NEGATIVE  NEGATIVE   Leukocytes, UA NEGATIVE  NEGATIVE  URINE MICROSCOPIC-ADD ON     Status: Abnormal   Collection Time   05/20/12  1:42 PM      Component Value Range   Squamous Epithelial / LPF FEW (*) RARE   WBC, UA 0-2  <3 WBC/hpf   RBC / HPF 3-6  <3 RBC/hpf   Bacteria, UA FEW (*) RARE   Urine-Other MUCOUS PRESENT       MAU Course  Procedures Tylenol 1000 mg given in ED, helped some.  Assessment and Plan  26 y.o. G3P1101 at 67w0dwith 1.  Hip, back, leg pain.  Musculoskeletal changes of pregnancy. Tylenol for pain. Trial of flexeril. Maternity support belt. Talk to PCP about PT or chiropractor. 2.  IUP:  Reactive NST. Labor precautions given. 3.  Hematuria. Urine culture sent. 4.  Has appt with Dr. MRuthann CancerWednesday.  FMartha Clan11/08/2011, 12:53 PM

## 2012-05-20 NOTE — MAU Note (Signed)
Pt states pain began 3 days ago, started in left hip area now is bilateral. Pain feels like aching feverish feeling going down both legs and stops at her knees. Feels better with walking. Denies abnormal vaginal discahrge or bleeding.

## 2012-05-21 LAB — URINE CULTURE

## 2012-06-25 ENCOUNTER — Inpatient Hospital Stay (HOSPITAL_COMMUNITY)
Admission: AD | Admit: 2012-06-25 | Discharge: 2012-06-25 | Disposition: A | Discharge status: Home / Self Care | Payer: Medicaid Other | Source: Ambulatory Visit | Attending: Obstetrics | Admitting: Obstetrics

## 2012-06-25 ENCOUNTER — Encounter (HOSPITAL_COMMUNITY): Payer: Self-pay | Admitting: Family

## 2012-06-25 ENCOUNTER — Inpatient Hospital Stay (HOSPITAL_COMMUNITY): Payer: Medicaid Other

## 2012-06-25 ENCOUNTER — Encounter (HOSPITAL_COMMUNITY): Payer: Self-pay | Admitting: *Deleted

## 2012-06-25 DIAGNOSIS — O99891 Other specified diseases and conditions complicating pregnancy: Secondary | ICD-10-CM | POA: Insufficient documentation

## 2012-06-25 DIAGNOSIS — O368190 Decreased fetal movements, unspecified trimester, not applicable or unspecified: Secondary | ICD-10-CM | POA: Insufficient documentation

## 2012-06-25 DIAGNOSIS — R109 Unspecified abdominal pain: Secondary | ICD-10-CM | POA: Insufficient documentation

## 2012-06-25 DIAGNOSIS — O4700 False labor before 37 completed weeks of gestation, unspecified trimester: Secondary | ICD-10-CM

## 2012-06-25 DIAGNOSIS — R1012 Left upper quadrant pain: Secondary | ICD-10-CM | POA: Insufficient documentation

## 2012-06-25 LAB — URINALYSIS, ROUTINE W REFLEX MICROSCOPIC
Glucose, UA: NEGATIVE mg/dL
Protein, ur: NEGATIVE mg/dL

## 2012-06-25 LAB — URINE MICROSCOPIC-ADD ON

## 2012-06-25 MED ORDER — NIFEDIPINE 10 MG PO CAPS
20.0000 mg | ORAL_CAPSULE | Freq: Once | ORAL | Status: AC
  Administered 2012-06-25: 20 mg via ORAL
  Filled 2012-06-25: qty 2

## 2012-06-25 MED ORDER — GI COCKTAIL ~~LOC~~
30.0000 mL | Freq: Once | ORAL | Status: AC
  Administered 2012-06-25: 30 mL via ORAL
  Filled 2012-06-25: qty 30

## 2012-06-25 MED ORDER — ZOLPIDEM TARTRATE 5 MG PO TABS
5.0000 mg | ORAL_TABLET | Freq: Once | ORAL | Status: AC
  Administered 2012-06-25: 5 mg via ORAL
  Filled 2012-06-25: qty 1

## 2012-06-25 NOTE — MAU Note (Addendum)
Patient reports she returned home from first MAU visit today and was having LUQ pain; took 1049m of Tylenol at 1130. Pain did not subside and decided to come back in. States she does not feel it is contraction pain; is not getting worse, but is constant.

## 2012-06-25 NOTE — Progress Notes (Signed)
Dr Delsa Sale notified of pt's arrival and complaints of contractions, VE, FHR pattern, orders received

## 2012-06-25 NOTE — MAU Note (Signed)
Patient states she is having upper left abdominal pain that is constant and started this am. Was evaluated in MAU earlier. States pain is getting worse and is burning/sharp. Had vomiting x 3 this am. Loose BM x 1 since leaving the hospital. Has eaten since leaving the hospital and took Tylenol which took the edge off the pain for a while, now back. Denies leaking or bleeding. Reports good fetal movement but feels it has been decreasing for the past month.

## 2012-06-25 NOTE — MAU Note (Signed)
Pt states she woke up this morning and was having cramping and it has gotten worse since 730. Denies any bleeding or leakage of fluid. States a decrease in fetal movement

## 2012-06-25 NOTE — MAU Provider Note (Signed)
History     CSN: [id number]  Arrival date and time: 06/25/12 1735   First Provider Initiated Contact with Patient 06/25/12 1851      Chief Complaint  Patient presents with  . Abdominal Pain   HPI Morgan Newton is 26 y.o. G3P1101 95w1dweeks presenting with upper/mid left abdominal pain.  Was seen here this morning for same sxs.  Cervical exam at that time found her to be closed.  She is a patient of Dr. MMarcheta Grammes  She went home and ate a fried steak biscuit and water.  Took tylenol.  Pain has not worsened but continues.  "the only time it is not there and when I am flat on my back with my knees bent".  Worse when she moves.     Past Medical History  Diagnosis Date  . Asthma   . Anemia   . Heart murmur   . Genital herpes   . Depression   . Hypertension     PIH  . Pregnancy induced hypertension   . Gestational diabetes     1st pregnancy-diet controlled  . Infection     urinary tract infection  . PID (acute pelvic inflammatory disease)   . Abnormal Pap smear 2012    colpo    Past Surgical History  Procedure Date  . Ganglion cyst excision     Family History  Problem Relation Age of Onset  . Other Neg Hx   . Hearing loss Neg Hx   . Hypertension Mother   . Hypertension Maternal Grandmother   . Hypertension Maternal Grandfather     History  Substance Use Topics  . Smoking status: Current Some Day Smoker    Types: Cigarettes  . Smokeless tobacco: Never Used  . Alcohol Use: No    Allergies:  Allergies  Allergen Reactions  . Penicillins Shortness Of Breath and Palpitations  . Caffeine Other (See Comments)    Heart murmur    Prescriptions prior to admission  Medication Sig Dispense Refill  . acetaminophen (TYLENOL) 500 MG tablet Take 2 tablets (1,000 mg total) by mouth every 6 (six) hours as needed for fever.  30 tablet  2  . albuterol (PROVENTIL HFA;VENTOLIN HFA) 108 (90 BASE) MCG/ACT inhaler Inhale 2 puffs into the lungs every 4 (four) hours as needed.  For wheezing or shortness of breath       . calcium carbonate (TUMS - DOSED IN MG ELEMENTAL CALCIUM) 500 MG chewable tablet Chew 2 tablets by mouth daily as needed. For heartburn      . OVER THE COUNTER MEDICATION Take 1 tablet by mouth daily. Vitafusion Prenatal Gummies        Review of Systems  Gastrointestinal: Positive for abdominal pain (upper left and mid abdominal pain). Negative for nausea and vomiting.   Physical Exam   Blood pressure 137/76, pulse 80, temperature 98.1 F (36.7 C), temperature source Oral, resp. rate 18, height 5' (1.524 m), weight 187 lb 6.4 oz (85.004 kg), last menstrual period 10/16/2011, SpO2 100.00%.  Physical Exam  Constitutional: She is oriented to person, place, and time. She appears well-developed and well-nourished. No distress.  HENT:  Head: Normocephalic.  Neck: Normal range of motion.  Cardiovascular: Normal rate.   Respiratory: Effort normal.  GI: There is tenderness (upper left quadrant mild.  ). There is no rebound and no guarding.  Genitourinary:       Cervical exam by Ginger, RN (who also examined her this am) cervix tight fingertip external os  and closed internal os.  Neurological: She is alert and oriented to person, place, and time.  Psychiatric: She has a normal mood and affect. Her behavior is normal.    MAU Course  Procedures   FMS  Reactive with 15x15s, baseline FMR 135, mod variability.  Contractions q3-4 minutes.  MDM 19:18  Called made to Dr. Delsa Sale. (On call for Dr. Ruthann Cancer)  Message left for her to return my call.   19:25  Dr. Delsa Sale called back.  MSE reported.  Order given for GI cocktail and Procardia 68m po X 1.   20:47 called  Dr. JMichaelle Copasmessage to call me back.   She called back --reported Contractions q60sec after Procardia and GI COcktail given.  Patient states she pain is a 5/10 (8/10 before medication).  She had 1 episode of increase heart rate to 133 with drop in BP to 112/58 but back  within normal range.        CAlyse Low RN re-examined cervix--cervix is closed.  Patient is concerned something is wrong with her placenta.  Order given for Limited ultrasound to evaluate placenta and if normal, discharge to home with Ambien for sleep   21:00 Care turned over to N. FMare Ferrari CNM Assessment and Plan  A:  Abdominal pain at 324w1destation      P:  KEY,EVE M 06/25/2012, 6:58 PM   Pt reports some relief of LUQ pain with GI cocktail, however, is feeling stronger UCs. EFM reactive, TOCO shows contractions q 2 minutes.   Dilation: 2 Effacement (%): Thick Cervical Position: Anterior Station: -3 Presentation: Vertex Exam by:: C. Millican, RN/ N. FrMare FerrariCNM   U/S shows normal placenta, no evidence of abruption  A/P: [age].o. G3P1101 at 3659w1dQ pain likely GI in origin considering relief with GI cocktail, no evidence of abruption - u/s normal, uterus soft and nontender between contractions, no bleeding, reactive tracing Possible early labor - rev'd precautions, pt elects Ambien for therapeutic rest, will return with signs of active labor or f/u in office tomorrow as scheduled

## 2012-07-04 ENCOUNTER — Inpatient Hospital Stay (HOSPITAL_COMMUNITY)
Admission: AD | Admit: 2012-07-04 | Discharge: 2012-07-04 | Disposition: A | Discharge status: Home / Self Care | Payer: Medicaid Other | Source: Ambulatory Visit | Attending: Obstetrics | Admitting: Obstetrics

## 2012-07-04 DIAGNOSIS — O479 False labor, unspecified: Secondary | ICD-10-CM | POA: Insufficient documentation

## 2012-07-04 DIAGNOSIS — R197 Diarrhea, unspecified: Secondary | ICD-10-CM | POA: Insufficient documentation

## 2012-07-04 DIAGNOSIS — O471 False labor at or after 37 completed weeks of gestation: Secondary | ICD-10-CM

## 2012-07-04 LAB — URINALYSIS, ROUTINE W REFLEX MICROSCOPIC
Leukocytes, UA: NEGATIVE
Protein, ur: NEGATIVE mg/dL
Urobilinogen, UA: 0.2 mg/dL (ref 0.0–1.0)

## 2012-07-04 LAB — URINE MICROSCOPIC-ADD ON

## 2012-07-04 NOTE — MAU Note (Signed)
C/o vaginal pain and abdominal cramping since 20000 last night;

## 2012-07-04 NOTE — MAU Note (Signed)
Missed her OB appointment yesterday;

## 2012-07-04 NOTE — MAU Provider Note (Signed)
Morgan Newton is a 26 y.o. female @ 49w3dgestation who presents to MAU with contractions. She also reports vaginal pressure and episode of diarrhea today.   BP 124/80  Pulse 81  Temp 99.2 F (37.3 C) (Oral)  Resp 20  Ht 5' (1.524 m)  Wt 187 lb (84.823 kg)  BMI 36.52 kg/m2  LMP 10/16/2011   Patient having mild contractions Dilation: 2 Effacement (%): Thick Cervical Position: Posterior Exam by:: HDebroah BallerNP   Medical screening exam complete. RN to recheck cervix and call report to Dr. MRuthann Cancer  Reactive NST.  Results for orders placed during the hospital encounter of 07/04/12 (from the past 24 hour(s))  URINALYSIS, ROUTINE W REFLEX MICROSCOPIC     Status: Abnormal   Collection Time   07/04/12 10:01 AM      Component Value Range   Color, Urine YELLOW  YELLOW   APPearance CLEAR  CLEAR   Specific Gravity, Urine 1.010  1.005 - 1.030   pH 7.0  5.0 - 8.0   Glucose, UA NEGATIVE  NEGATIVE mg/dL   Hgb urine dipstick TRACE (*) NEGATIVE   Bilirubin Urine NEGATIVE  NEGATIVE   Ketones, ur NEGATIVE  NEGATIVE mg/dL   Protein, ur NEGATIVE  NEGATIVE mg/dL   Urobilinogen, UA 0.2  0.0 - 1.0 mg/dL   Nitrite NEGATIVE  NEGATIVE   Leukocytes, UA NEGATIVE  NEGATIVE  URINE MICROSCOPIC-ADD ON     Status: Normal   Collection Time   07/04/12 10:01 AM      Component Value Range   Squamous Epithelial / LPF RARE  RARE   WBC, UA 0-2  <3 WBC/hpf   RBC / HPF 0-2  <3 RBC/hpf   Bacteria, UA RARE  RARE

## 2012-07-10 ENCOUNTER — Inpatient Hospital Stay (HOSPITAL_COMMUNITY): Payer: Medicaid Other

## 2012-07-10 ENCOUNTER — Inpatient Hospital Stay (HOSPITAL_COMMUNITY)
Admission: AD | Admit: 2012-07-10 | Discharge: 2012-07-10 | Disposition: A | Discharge status: Home / Self Care | Payer: Medicaid Other | Source: Ambulatory Visit | Attending: Obstetrics | Admitting: Obstetrics

## 2012-07-10 ENCOUNTER — Encounter (HOSPITAL_COMMUNITY): Payer: Self-pay | Admitting: *Deleted

## 2012-07-10 DIAGNOSIS — O4700 False labor before 37 completed weeks of gestation, unspecified trimester: Secondary | ICD-10-CM | POA: Insufficient documentation

## 2012-07-10 DIAGNOSIS — O368390 Maternal care for abnormalities of the fetal heart rate or rhythm, unspecified trimester, not applicable or unspecified: Secondary | ICD-10-CM | POA: Insufficient documentation

## 2012-07-10 NOTE — Progress Notes (Signed)
Dr Ruthann Cancer updated on pt's u/s report and FHR tracing  Orders received

## 2012-07-10 NOTE — Progress Notes (Signed)
Spoke with Dr Ruthann Cancer in regards to pt's NST., variables and contraction pattern, orders received.

## 2012-07-10 NOTE — MAU Note (Signed)
Pt in for NST per orders of Dr Ruthann Cancer

## 2012-07-18 ENCOUNTER — Encounter (HOSPITAL_COMMUNITY): Payer: Self-pay | Admitting: Family

## 2012-07-18 ENCOUNTER — Inpatient Hospital Stay (HOSPITAL_COMMUNITY)
Admission: AD | Admit: 2012-07-18 | Discharge: 2012-07-18 | Disposition: A | Discharge status: Home / Self Care | Payer: Medicaid Other | Source: Ambulatory Visit | Attending: Obstetrics | Admitting: Obstetrics

## 2012-07-18 DIAGNOSIS — O368190 Decreased fetal movements, unspecified trimester, not applicable or unspecified: Secondary | ICD-10-CM | POA: Insufficient documentation

## 2012-07-18 DIAGNOSIS — R109 Unspecified abdominal pain: Secondary | ICD-10-CM | POA: Insufficient documentation

## 2012-07-18 HISTORY — DX: Trichomoniasis, unspecified: A59.9

## 2012-07-18 HISTORY — DX: Chlamydial infection, unspecified: A74.9

## 2012-07-18 HISTORY — DX: Gonococcal infection, unspecified: A54.9

## 2012-07-18 NOTE — MAU Note (Signed)
Patient reports to MAU directly from Dr. Marcheta Grammes office, where she was seen today for regular prenatal visit. Told Dr. Ruthann Cancer she had not felt baby move and was told to come here for NST. Denies vaginal bleeding/discharge. Reports intermittent cramping x 2 weeks, worse this week. Reports that in office today, SVE  was 2/70%. Patient is concerned because of hx of 28 week stillborn in 2009.

## 2012-07-20 ENCOUNTER — Encounter (HOSPITAL_COMMUNITY): Payer: Self-pay | Admitting: *Deleted

## 2012-07-20 ENCOUNTER — Telehealth (HOSPITAL_COMMUNITY): Payer: Self-pay | Admitting: *Deleted

## 2012-07-20 NOTE — Telephone Encounter (Signed)
Preadmission screen  

## 2012-07-21 ENCOUNTER — Inpatient Hospital Stay (HOSPITAL_COMMUNITY)
Admission: RE | Admit: 2012-07-21 | Discharge: 2012-07-25 | DRG: 766 | Disposition: A | Discharge status: Home / Self Care | Payer: Medicaid Other | Source: Ambulatory Visit | Attending: Obstetrics | Admitting: Obstetrics

## 2012-07-21 ENCOUNTER — Encounter (HOSPITAL_COMMUNITY): Payer: Self-pay

## 2012-07-21 DIAGNOSIS — O480 Post-term pregnancy: Principal | ICD-10-CM | POA: Diagnosis present

## 2012-07-21 LAB — COMPREHENSIVE METABOLIC PANEL WITH GFR
ALT: 7 U/L (ref 0–35)
AST: 25 U/L (ref 0–37)
Albumin: 2.7 g/dL — ABNORMAL LOW (ref 3.5–5.2)
Alkaline Phosphatase: 216 U/L — ABNORMAL HIGH (ref 39–117)
BUN: 3 mg/dL — ABNORMAL LOW (ref 6–23)
CO2: 20 meq/L (ref 19–32)
Calcium: 9.3 mg/dL (ref 8.4–10.5)
Chloride: 99 meq/L (ref 96–112)
Creatinine, Ser: 0.59 mg/dL (ref 0.50–1.10)
GFR calc Af Amer: >90 mL/min
GFR calc non Af Amer: >90 mL/min
Glucose, Bld: 168 mg/dL — ABNORMAL HIGH (ref 70–99)
Potassium: 4.1 meq/L (ref 3.5–5.1)
Sodium: 132 meq/L — ABNORMAL LOW (ref 135–145)
Total Bilirubin: 0.4 mg/dL (ref 0.3–1.2)
Total Protein: 6.1 g/dL (ref 6.0–8.3)

## 2012-07-21 LAB — CBC
HCT: 31.8 % — ABNORMAL LOW (ref 36.0–46.0)
Hemoglobin: 10.7 g/dL — ABNORMAL LOW (ref 12.0–15.0)
MCV: 83.7 fL (ref 78.0–100.0)
RBC: 3.8 MIL/uL — ABNORMAL LOW (ref 3.87–5.11)
RDW: 14.3 % (ref 11.5–15.5)
WBC: 14 10*3/uL — ABNORMAL HIGH (ref 4.0–10.5)

## 2012-07-21 LAB — LACTATE DEHYDROGENASE: LDH: 303 U/L — ABNORMAL HIGH (ref 94–250)

## 2012-07-21 LAB — URIC ACID: Uric Acid, Serum: 3.9 mg/dL (ref 2.4–7.0)

## 2012-07-21 MED ORDER — EPHEDRINE 5 MG/ML INJ: 10.0000 mg | INTRAVENOUS | Status: DC | PRN

## 2012-07-21 MED ORDER — SIMETHICONE 80 MG PO CHEW
80.0000 mg | CHEWABLE_TABLET | Freq: Four times a day (QID) | ORAL | Status: DC | PRN
  Administered 2012-07-21: 80 mg via ORAL

## 2012-07-21 MED ORDER — ONDANSETRON HCL 4 MG/2ML IJ SOLN: 4.0000 mg | Freq: Four times a day (QID) | INTRAMUSCULAR | Status: DC | PRN

## 2012-07-21 MED ORDER — TERBUTALINE SULFATE 1 MG/ML IJ SOLN: 0.2500 mg | Freq: Once | INTRAMUSCULAR | Status: AC | PRN

## 2012-07-21 MED ORDER — EPHEDRINE 5 MG/ML INJ
10.0000 mg | INTRAVENOUS | Status: DC | PRN
  Filled 2012-07-21: qty 4

## 2012-07-21 MED ORDER — OXYTOCIN BOLUS FROM INFUSION: 500.0000 mL | INTRAVENOUS | Status: DC

## 2012-07-21 MED ORDER — DIPHENHYDRAMINE HCL 50 MG/ML IJ SOLN: 12.5000 mg | INTRAMUSCULAR | Status: DC | PRN

## 2012-07-21 MED ORDER — FLEET ENEMA 7-19 GM/118ML RE ENEM: 1.0000 | ENEMA | RECTAL | Status: DC | PRN

## 2012-07-21 MED ORDER — ACETAMINOPHEN 325 MG PO TABS: 650.0000 mg | ORAL_TABLET | ORAL | Status: DC | PRN

## 2012-07-21 MED ORDER — FENTANYL 2.5 MCG/ML BUPIVACAINE 1/10 % EPIDURAL INFUSION (WH - ANES)
14.0000 mL/h | INTRAMUSCULAR | Status: DC
  Administered 2012-07-22: 14 mL/h via EPIDURAL
  Filled 2012-07-21: qty 125

## 2012-07-21 MED ORDER — LACTATED RINGERS IV SOLN
INTRAVENOUS | Status: DC
  Administered 2012-07-21: 21:00:00 via INTRAVENOUS

## 2012-07-21 MED ORDER — PHENYLEPHRINE 40 MCG/ML (10ML) SYRINGE FOR IV PUSH (FOR BLOOD PRESSURE SUPPORT)
80.0000 ug | PREFILLED_SYRINGE | INTRAVENOUS | Status: DC | PRN
  Filled 2012-07-21: qty 5

## 2012-07-21 MED ORDER — OXYTOCIN 40 UNITS IN LACTATED RINGERS INFUSION - SIMPLE MED
1.0000 m[IU]/min | INTRAVENOUS | Status: DC
  Administered 2012-07-21: 2 m[IU]/min via INTRAVENOUS
  Filled 2012-07-21: qty 1000

## 2012-07-21 MED ORDER — CITRIC ACID-SODIUM CITRATE 334-500 MG/5ML PO SOLN
30.0000 mL | ORAL | Status: DC | PRN
  Administered 2012-07-22: 30 mL via ORAL
  Filled 2012-07-21 (×2): qty 15

## 2012-07-21 MED ORDER — OXYCODONE-ACETAMINOPHEN 5-325 MG PO TABS
1.0000 | ORAL_TABLET | ORAL | Status: DC | PRN
Start: 2012-07-21 — End: 2012-07-22

## 2012-07-21 MED ORDER — BUTORPHANOL TARTRATE 1 MG/ML IJ SOLN
1.0000 mg | INTRAMUSCULAR | Status: DC | PRN
  Administered 2012-07-21 – 2012-07-22 (×2): 1 mg via INTRAVENOUS
  Filled 2012-07-21 (×2): qty 1

## 2012-07-21 MED ORDER — LIDOCAINE HCL (PF) 1 % IJ SOLN: 30.0000 mL | INTRAMUSCULAR | Status: DC | PRN

## 2012-07-21 MED ORDER — OXYTOCIN 40 UNITS IN LACTATED RINGERS INFUSION - SIMPLE MED: 62.5000 mL/h | INTRAVENOUS | Status: DC

## 2012-07-21 MED ORDER — IBUPROFEN 600 MG PO TABS: 600.0000 mg | ORAL_TABLET | Freq: Four times a day (QID) | ORAL | Status: DC | PRN

## 2012-07-21 MED ORDER — PHENYLEPHRINE 40 MCG/ML (10ML) SYRINGE FOR IV PUSH (FOR BLOOD PRESSURE SUPPORT): 80.0000 ug | PREFILLED_SYRINGE | INTRAVENOUS | Status: DC | PRN

## 2012-07-21 MED ORDER — LACTATED RINGERS IV SOLN
500.0000 mL | INTRAVENOUS | Status: DC | PRN
  Administered 2012-07-22: 500 mL via INTRAVENOUS

## 2012-07-21 MED ORDER — LACTATED RINGERS IV SOLN
500.0000 mL | Freq: Once | INTRAVENOUS | Status: AC
  Administered 2012-07-22: 500 mL via INTRAVENOUS

## 2012-07-21 NOTE — H&P (Signed)
This is Dr. Gracy Racer dictating the history and physical on blank blank she's a 27 year old gravida 3 para [birth year]20/1999 who is at [redacted] weeks gestation she is due 07/22/2012 and desired induction negative GBS cervix 3 cm 80% vertex minus want to and she is now on low-dose Pitocin with irregular contractions Past medical history negative Surgical history negative Social history negative System review noncontributory Physical well-developed female in labor HEENT negative Lungs clear to P&A Heart regular rhythm no murmurs no gallops Abdomen term Pelvic as described above Extremities negative and

## 2012-07-21 NOTE — Progress Notes (Signed)
Pt states she has had some headaches with blurred vision and dizziness last week. Pt has had PIH with last pregnancy. BP WNL. Reflexes and clonus WNL. Order to draw [facility] labs to make sure pt does not have PIH. Pt informed of POC

## 2012-07-22 ENCOUNTER — Encounter (HOSPITAL_COMMUNITY): Payer: Self-pay | Admitting: Anesthesiology

## 2012-07-22 ENCOUNTER — Inpatient Hospital Stay (HOSPITAL_COMMUNITY): Payer: Medicaid Other

## 2012-07-22 ENCOUNTER — Encounter (HOSPITAL_COMMUNITY)
Admission: RE | Disposition: A | Discharge status: Home / Self Care | Payer: Self-pay | Source: Ambulatory Visit | Attending: Obstetrics

## 2012-07-22 ENCOUNTER — Encounter (HOSPITAL_COMMUNITY): Payer: Self-pay

## 2012-07-22 ENCOUNTER — Inpatient Hospital Stay (HOSPITAL_COMMUNITY): Payer: Medicaid Other | Admitting: Anesthesiology

## 2012-07-22 LAB — TYPE AND SCREEN
ABO/RH(D): O POS
Antibody Screen: NEGATIVE

## 2012-07-22 LAB — GLUCOSE, CAPILLARY: Glucose-Capillary: 105 mg/dL — ABNORMAL HIGH (ref 70–99)

## 2012-07-22 LAB — CBC
Platelets: 286 10*3/uL (ref 150–400)
RBC: 3.25 MIL/uL — ABNORMAL LOW (ref 3.87–5.11)
WBC: 18.1 10*3/uL — ABNORMAL HIGH (ref 4.0–10.5)

## 2012-07-22 SURGERY — Surgical Case
Anesthesia: Regional | Wound class: Clean Contaminated

## 2012-07-22 MED ORDER — NALOXONE HCL 1 MG/ML IJ SOLN
1.0000 ug/kg/h | INTRAVENOUS | Status: DC | PRN
  Filled 2012-07-22: qty 2

## 2012-07-22 MED ORDER — METOCLOPRAMIDE HCL 5 MG/ML IJ SOLN: 10.0000 mg | Freq: Three times a day (TID) | INTRAMUSCULAR | Status: DC | PRN

## 2012-07-22 MED ORDER — TETANUS-DIPHTH-ACELL PERTUSSIS 5-2.5-18.5 LF-MCG/0.5 IM SUSP: 0.5000 mL | Freq: Once | INTRAMUSCULAR | Status: DC

## 2012-07-22 MED ORDER — DIBUCAINE 1 % RE OINT: 1.0000 "application " | TOPICAL_OINTMENT | RECTAL | Status: DC | PRN

## 2012-07-22 MED ORDER — MORPHINE SULFATE (PF) 0.5 MG/ML IJ SOLN
INTRAMUSCULAR | Status: DC | PRN
  Administered 2012-07-22: 1 mg via INTRAVENOUS

## 2012-07-22 MED ORDER — PRENATAL MULTIVITAMIN CH
1.0000 | ORAL_TABLET | Freq: Every day | ORAL | Status: DC
  Administered 2012-07-23 – 2012-07-25 (×3): 1 via ORAL
  Filled 2012-07-22 (×3): qty 1

## 2012-07-22 MED ORDER — MEPERIDINE HCL 25 MG/ML IJ SOLN
6.2500 mg | INTRAMUSCULAR | Status: DC | PRN
  Administered 2012-07-22: 6.25 mg via INTRAVENOUS

## 2012-07-22 MED ORDER — FENTANYL CITRATE 0.05 MG/ML IJ SOLN: 25.0000 ug | INTRAMUSCULAR | Status: DC | PRN

## 2012-07-22 MED ORDER — NALBUPHINE SYRINGE 5 MG/0.5 ML
5.0000 mg | INJECTION | INTRAMUSCULAR | Status: DC | PRN
  Filled 2012-07-22: qty 1

## 2012-07-22 MED ORDER — DIPHENHYDRAMINE HCL 25 MG PO CAPS
25.0000 mg | ORAL_CAPSULE | ORAL | Status: DC | PRN
  Administered 2012-07-23 (×3): 25 mg via ORAL
  Filled 2012-07-22 (×3): qty 1

## 2012-07-22 MED ORDER — ONDANSETRON HCL 4 MG PO TABS: 4.0000 mg | ORAL_TABLET | ORAL | Status: DC | PRN

## 2012-07-22 MED ORDER — KETOROLAC TROMETHAMINE 30 MG/ML IJ SOLN
INTRAMUSCULAR | Status: AC
  Administered 2012-07-22: 30 mg via INTRAVENOUS
  Filled 2012-07-22: qty 1

## 2012-07-22 MED ORDER — MENTHOL 3 MG MT LOZG: 1.0000 | LOZENGE | OROMUCOSAL | Status: DC | PRN

## 2012-07-22 MED ORDER — LACTATED RINGERS IV SOLN
INTRAVENOUS | Status: DC
Start: 2012-07-22 — End: 2012-07-22

## 2012-07-22 MED ORDER — NALOXONE HCL 0.4 MG/ML IJ SOLN: 0.4000 mg | INTRAMUSCULAR | Status: DC | PRN

## 2012-07-22 MED ORDER — DIPHENHYDRAMINE HCL 50 MG/ML IJ SOLN: 25.0000 mg | INTRAMUSCULAR | Status: DC | PRN

## 2012-07-22 MED ORDER — LACTATED RINGERS IV SOLN
INTRAVENOUS | Status: DC | PRN
  Administered 2012-07-22: 05:00:00 via INTRAVENOUS

## 2012-07-22 MED ORDER — KETOROLAC TROMETHAMINE 30 MG/ML IJ SOLN: 30.0000 mg | Freq: Four times a day (QID) | INTRAMUSCULAR | Status: AC | PRN

## 2012-07-22 MED ORDER — SIMETHICONE 80 MG PO CHEW: 80.0000 mg | CHEWABLE_TABLET | ORAL | Status: DC | PRN

## 2012-07-22 MED ORDER — PROMETHAZINE HCL 25 MG/ML IJ SOLN: 6.2500 mg | INTRAMUSCULAR | Status: DC | PRN

## 2012-07-22 MED ORDER — EPHEDRINE SULFATE 50 MG/ML IJ SOLN
INTRAMUSCULAR | Status: DC | PRN
  Administered 2012-07-22: 10 mg via INTRAVENOUS

## 2012-07-22 MED ORDER — MEPERIDINE HCL 25 MG/ML IJ SOLN: 6.2500 mg | INTRAMUSCULAR | Status: DC | PRN

## 2012-07-22 MED ORDER — OXYTOCIN 10 UNIT/ML IJ SOLN
40.0000 [IU] | INTRAVENOUS | Status: DC | PRN
  Administered 2012-07-22: 40 [IU] via INTRAVENOUS

## 2012-07-22 MED ORDER — MEPERIDINE HCL 25 MG/ML IJ SOLN
INTRAMUSCULAR | Status: AC
  Filled 2012-07-22: qty 1

## 2012-07-22 MED ORDER — MORPHINE SULFATE (PF) 0.5 MG/ML IJ SOLN
INTRAMUSCULAR | Status: DC | PRN
  Administered 2012-07-22: 4 mg via EPIDURAL

## 2012-07-22 MED ORDER — LACTATED RINGERS IV SOLN
INTRAVENOUS | Status: DC | PRN
  Administered 2012-07-22 (×2): via INTRAVENOUS

## 2012-07-22 MED ORDER — ZOLPIDEM TARTRATE 5 MG PO TABS: 5.0000 mg | ORAL_TABLET | Freq: Every evening | ORAL | Status: DC | PRN

## 2012-07-22 MED ORDER — SCOPOLAMINE 1 MG/3DAYS TD PT72
MEDICATED_PATCH | TRANSDERMAL | Status: AC
  Filled 2012-07-22: qty 1

## 2012-07-22 MED ORDER — SODIUM CHLORIDE 0.9 % IJ SOLN: 3.0000 mL | INTRAMUSCULAR | Status: DC | PRN

## 2012-07-22 MED ORDER — KETOROLAC TROMETHAMINE 30 MG/ML IJ SOLN
30.0000 mg | Freq: Four times a day (QID) | INTRAMUSCULAR | Status: AC | PRN
  Administered 2012-07-22 (×2): 30 mg via INTRAVENOUS
  Filled 2012-07-22: qty 1

## 2012-07-22 MED ORDER — DIPHENHYDRAMINE HCL 25 MG PO CAPS: 25.0000 mg | ORAL_CAPSULE | Freq: Four times a day (QID) | ORAL | Status: DC | PRN

## 2012-07-22 MED ORDER — SENNOSIDES-DOCUSATE SODIUM 8.6-50 MG PO TABS
2.0000 | ORAL_TABLET | Freq: Every day | ORAL | Status: DC
  Administered 2012-07-22 – 2012-07-24 (×3): 2 via ORAL

## 2012-07-22 MED ORDER — DIPHENHYDRAMINE HCL 50 MG/ML IJ SOLN
12.5000 mg | INTRAMUSCULAR | Status: DC | PRN
  Administered 2012-07-22 – 2012-07-23 (×3): 12.5 mg via INTRAVENOUS
  Filled 2012-07-22 (×3): qty 1

## 2012-07-22 MED ORDER — WITCH HAZEL-GLYCERIN EX PADS: 1.0000 "application " | MEDICATED_PAD | CUTANEOUS | Status: DC | PRN

## 2012-07-22 MED ORDER — IBUPROFEN 600 MG PO TABS
600.0000 mg | ORAL_TABLET | Freq: Four times a day (QID) | ORAL | Status: DC
  Administered 2012-07-22 – 2012-07-25 (×10): 600 mg via ORAL
  Filled 2012-07-22 (×11): qty 1

## 2012-07-22 MED ORDER — SODIUM BICARBONATE 8.4 % IV SOLN
INTRAVENOUS | Status: DC | PRN
  Administered 2012-07-22: 5 mL via EPIDURAL

## 2012-07-22 MED ORDER — LANOLIN HYDROUS EX OINT: 1.0000 "application " | TOPICAL_OINTMENT | CUTANEOUS | Status: DC | PRN

## 2012-07-22 MED ORDER — ONDANSETRON HCL 4 MG/2ML IJ SOLN: 4.0000 mg | INTRAMUSCULAR | Status: DC | PRN

## 2012-07-22 MED ORDER — OXYTOCIN 40 UNITS IN LACTATED RINGERS INFUSION - SIMPLE MED: 62.5000 mL/h | INTRAVENOUS | Status: AC

## 2012-07-22 MED ORDER — NALBUPHINE SYRINGE 5 MG/0.5 ML
5.0000 mg | INJECTION | INTRAMUSCULAR | Status: DC | PRN
  Administered 2012-07-22: 5 mg via INTRAVENOUS
  Filled 2012-07-22 (×3): qty 1

## 2012-07-22 MED ORDER — MORPHINE SULFATE 0.5 MG/ML IJ SOLN
INTRAMUSCULAR | Status: AC
  Filled 2012-07-22: qty 10

## 2012-07-22 MED ORDER — ONDANSETRON HCL 4 MG/2ML IJ SOLN
INTRAMUSCULAR | Status: DC | PRN
  Administered 2012-07-22: 4 mg via INTRAVENOUS

## 2012-07-22 MED ORDER — LIDOCAINE HCL (PF) 1 % IJ SOLN
INTRAMUSCULAR | Status: DC | PRN
  Administered 2012-07-22 (×2): 5 mL

## 2012-07-22 MED ORDER — SIMETHICONE 80 MG PO CHEW
80.0000 mg | CHEWABLE_TABLET | Freq: Three times a day (TID) | ORAL | Status: DC
  Administered 2012-07-22 – 2012-07-25 (×11): 80 mg via ORAL

## 2012-07-22 MED ORDER — SCOPOLAMINE 1 MG/3DAYS TD PT72
1.0000 | MEDICATED_PATCH | Freq: Once | TRANSDERMAL | Status: AC
  Administered 2012-07-22: 1.5 mg via TRANSDERMAL

## 2012-07-22 MED ORDER — CLINDAMYCIN PHOSPHATE 900 MG/50ML IV SOLN
900.0000 mg | Freq: Once | INTRAVENOUS | Status: AC
  Administered 2012-07-22: 900 mg via INTRAVENOUS
  Filled 2012-07-22: qty 50

## 2012-07-22 MED ORDER — LACTATED RINGERS IV SOLN
INTRAVENOUS | Status: DC
  Administered 2012-07-22 (×2): via INTRAVENOUS

## 2012-07-22 MED ORDER — MIDAZOLAM HCL 2 MG/2ML IJ SOLN: 0.5000 mg | Freq: Once | INTRAMUSCULAR | Status: DC | PRN

## 2012-07-22 MED ORDER — OXYCODONE-ACETAMINOPHEN 5-325 MG PO TABS
1.0000 | ORAL_TABLET | ORAL | Status: DC | PRN
  Administered 2012-07-22: 1 via ORAL
  Administered 2012-07-23 – 2012-07-25 (×11): 2 via ORAL
  Filled 2012-07-22 (×11): qty 2
  Filled 2012-07-22: qty 1

## 2012-07-22 MED ORDER — ONDANSETRON HCL 4 MG/2ML IJ SOLN: 4.0000 mg | Freq: Three times a day (TID) | INTRAMUSCULAR | Status: DC | PRN

## 2012-07-22 SURGICAL SUPPLY — 32 items
ADH SKN CLS APL DERMABOND .7 (GAUZE/BANDAGES/DRESSINGS) ×1
CLOTH BEACON ORANGE TIMEOUT ST (SAFETY) ×2 IMPLANT
DERMABOND ADVANCED (GAUZE/BANDAGES/DRESSINGS) ×1
DERMABOND ADVANCED .7 DNX12 (GAUZE/BANDAGES/DRESSINGS) ×1 IMPLANT
DRAPE LG THREE QUARTER DISP (DRAPES) ×2 IMPLANT
DRSG OPSITE POSTOP 4X10 (GAUZE/BANDAGES/DRESSINGS) ×2 IMPLANT
DURAPREP 26ML APPLICATOR (WOUND CARE) ×2 IMPLANT
ELECT REM PT RETURN 9FT ADLT (ELECTROSURGICAL) ×2
ELECTRODE REM PT RTRN 9FT ADLT (ELECTROSURGICAL) ×1 IMPLANT
EXTRACTOR VACUUM M CUP 4 TUBE (SUCTIONS) IMPLANT
GLOVE BIO SURGEON STRL SZ8.5 (GLOVE) ×4 IMPLANT
GOWN PREVENTION PLUS LG XLONG (DISPOSABLE) ×4 IMPLANT
GOWN PREVENTION PLUS XXLARGE (GOWN DISPOSABLE) ×2 IMPLANT
KIT ABG SYR 3ML LUER SLIP (SYRINGE) IMPLANT
NDL HYPO 25X5/8 SAFETYGLIDE (NEEDLE) ×1 IMPLANT
NEEDLE HYPO 25X5/8 SAFETYGLIDE (NEEDLE) ×2 IMPLANT
NS IRRIG 1000ML POUR BTL (IV SOLUTION) ×2 IMPLANT
PACK C SECTION WH (CUSTOM PROCEDURE TRAY) ×2 IMPLANT
PAD OB MATERNITY 4.3X12.25 (PERSONAL CARE ITEMS) IMPLANT
SLEEVE SCD COMPRESS KNEE MED (MISCELLANEOUS) IMPLANT
SUT CHROMIC 0 CT 802H (SUTURE) ×2 IMPLANT
SUT CHROMIC 1 CTX 36 (SUTURE) ×4 IMPLANT
SUT CHROMIC 2 0 SH (SUTURE) ×2 IMPLANT
SUT GUT PLAIN 0 CT-3 TAN 27 (SUTURE) IMPLANT
SUT MON AB 4-0 PS1 27 (SUTURE) ×2 IMPLANT
SUT VIC AB 0 CT1 18XCR BRD8 (SUTURE) IMPLANT
SUT VIC AB 0 CT1 8-18 (SUTURE)
SUT VIC AB 0 CTX 36 (SUTURE) ×4
SUT VIC AB 0 CTX36XBRD ANBCTRL (SUTURE) ×2 IMPLANT
TOWEL OR 17X24 6PK STRL BLUE (TOWEL DISPOSABLE) ×6 IMPLANT
TRAY FOLEY CATH 14FR (SET/KITS/TRAYS/PACK) ×2 IMPLANT
WATER STERILE IRR 1000ML POUR (IV SOLUTION) ×2 IMPLANT

## 2012-07-22 NOTE — Anesthesia Preprocedure Evaluation (Signed)
Anesthesia Evaluation  Patient identified by MRN, date of birth, ID band Patient awake    Reviewed: Allergy & Precautions, H&P , Patient's Chart, lab work & pertinent test results  Airway Mallampati: III TM Distance: >3 FB Neck ROM: full    Dental No notable dental hx.    Pulmonary neg pulmonary ROS, asthma ,  breath sounds clear to auscultation  Pulmonary exam normal       Cardiovascular hypertension, negative cardio ROS  Rhythm:regular Rate:Normal     Neuro/Psych PSYCHIATRIC DISORDERS Depression negative neurological ROS  negative psych ROS   GI/Hepatic negative GI ROS, Neg liver ROS,   Endo/Other  negative endocrine ROSMorbid obesity  Renal/GU negative Renal ROS     Musculoskeletal   Abdominal   Peds  Hematology negative hematology ROS (+)   Anesthesia Other Findings   Reproductive/Obstetrics (+) Pregnancy                           Anesthesia Physical Anesthesia Plan  ASA: III  Anesthesia Plan: Epidural   Post-op Pain Management:    Induction:   Airway Management Planned:   Additional Equipment:   Intra-op Plan:   Post-operative Plan:   Informed Consent: I have reviewed the patients History and Physical, chart, labs and discussed the procedure including the risks, benefits and alternatives for the proposed anesthesia with the patient or authorized representative who has indicated his/her understanding and acceptance.     Plan Discussed with:   Anesthesia Plan Comments:         Anesthesia Quick Evaluation

## 2012-07-22 NOTE — Anesthesia Postprocedure Evaluation (Signed)
Anesthesia Post Note  Patient: Morgan Newton  Procedure(s) Performed: Procedure(s) (LRB): CESAREAN SECTION (N/A)  Anesthesia type: Epidural  Patient location: Mother/Baby  Post pain: Pain level controlled  Post assessment: Post-op Vital signs reviewed  Last Vitals:  Filed Vitals:   07/22/12 1258  BP: 124/74  Pulse: 71  Temp: 36.9 C  Resp: 16    Post vital signs: Reviewed  Level of consciousness:alert  Complications: No apparent anesthesia complications

## 2012-07-22 NOTE — OR Nursing (Signed)
Stat Cesarean Section, unable to complete preoperative count. Abdominal xray completed and results read by Dr Thornton Papas, radiologist. Lovell Sheehan, xray tech delivered the news of xray negative to OR staff.

## 2012-07-22 NOTE — Anesthesia Procedure Notes (Signed)
Epidural Patient location during procedure: OB Start time: 07/22/2012 1:50 AM  Staffing Anesthesiologist: Royce Macadamia., Mickle Asper. Performed by: anesthesiologist   Preanesthetic Checklist Completed: patient identified, site marked, surgical consent, pre-op evaluation, timeout performed, IV checked, risks and benefits discussed and monitors and equipment checked  Epidural Patient position: sitting Prep: site prepped and draped and DuraPrep Patient monitoring: continuous pulse ox and blood pressure Approach: midline Injection technique: LOR air and LOR saline  Needle:  Needle type: Tuohy  Needle gauge: 17 G Needle length: 9 cm and 9 Needle insertion depth: 8 cm Catheter type: closed end flexible Catheter size: 19 Gauge Catheter at skin depth: 14 cm Test dose: negative  Assessment Events: blood not aspirated, injection not painful, no injection resistance, negative IV test and no paresthesia  Additional Notes Patient identified.  Risk benefits discussed including failed block, incomplete pain control, headache, nerve damage, paralysis, blood pressure changes, nausea, vomiting, reactions to medication both toxic or allergic, and postpartum back pain.  Patient expressed understanding and wished to proceed.  All questions were answered.  Sterile technique used throughout procedure and epidural site dressed with sterile barrier dressing. No paresthesia or other complications noted.The patient did not experience any signs of intravascular injection such as tinnitus or metallic taste in mouth nor signs of intrathecal spread such as rapid motor block. Please see nursing notes for vital signs.

## 2012-07-22 NOTE — Op Note (Signed)
preop diagnosis persistent bradycardia uncorrected by amnioinfusion and position change Postop diagnosis tight nuchal cord Anesthesia epidural Surgeon Dr. Gracy Racer Procedure patient placed on the operating table   the epidural rapidly dosed abdomen prepped and draped transverse suprapubic incision made the patient was in the OR at 4:42  am transverse incision  made the rectus fascia fascia cleaned and incised the length of the incision recti muscles retracted laterally peritoneum incised longitudinally transverse incision made on the visceroperitoneum above the bladder and the bladder mobilized inferiorly transverse low incision made patient delivered of a female Apgar 4 and 9 there was a tight nuchal cord the cord pH was 7.03. The placenta removed manually and sent to pathology the uterine cavity clean with dry laps uterine incision closed in one layer with continuous  No 1 chromic   hemostasis was satisfactory lap and sponge counts correct abdomen closed in layers peritoneum continuous with of 0 chromic fascia continuous suture of 0 Dexon and the skin shows a subcuticular stitch of 4-0 Monocryl blood loss was 700 cc patient tolerated the procedure well

## 2012-07-22 NOTE — Transfer of Care (Signed)
Immediate Anesthesia Transfer of Care Note  Patient: Morgan Newton  Procedure(s) Performed: Procedure(s) (LRB) with comments: CESAREAN SECTION (N/A) - Primary Cesarean Section   Patient Location: PACU  Anesthesia Type:Regional  Level of Consciousness: awake, alert , oriented and patient cooperative  Airway & Oxygen Therapy: Patient Spontanous Breathing  Post-op Assessment: Report given to PACU RN and Post -op Vital signs reviewed and stable  Post vital signs: Reviewed and stable  Complications: No apparent anesthesia complications

## 2012-07-22 NOTE — Addendum Note (Signed)
Addendum  created 07/22/12 1316 by Asher Muir, CRNA   Modules edited:Charges VN, Notes Section

## 2012-07-22 NOTE — Anesthesia Postprocedure Evaluation (Signed)
Anesthesia Post Note  Patient: Morgan Newton  Procedure(s) Performed: Procedure(s) (LRB): CESAREAN SECTION (N/A)  Anesthesia type: Epidural  Patient location: PACU  Post pain: Pain level controlled  Post assessment: Post-op Vital signs reviewed  Last Vitals:  Filed Vitals:   07/22/12 0440  BP:   Pulse: 25  Temp:   Resp:     Post vital signs: Reviewed  Level of consciousness: awake  Complications: No apparent anesthesia complications

## 2012-07-22 NOTE — Progress Notes (Signed)
Patient ID: Morgan Newton, female   DOB: 29-Oct-1985, 27 y.o.   MRN: [id number] To the room at 4: 30 AM because of fetal heart  had suddenly gone down into the 60s IUPC was rapidly inserted her cervix is 9 cm and the vertex -2 station and amnioinfusion began   with amnioinfusion position change a fetal heart remained in the 60s it was decided should've a stat C-section and was taken to the operating room    had boy at 4:50 AM

## 2012-07-23 LAB — CBC
HCT: 24.8 % — ABNORMAL LOW (ref 36.0–46.0)
MCHC: 32.3 g/dL (ref 30.0–36.0)
Platelets: 276 10*3/uL (ref 150–400)
RDW: 14.3 % (ref 11.5–15.5)

## 2012-07-23 NOTE — Progress Notes (Signed)
Patient ID: Morgan Newton, female   DOB: Jun 10, 1986, 27 y.o.   MRN: [id number] Postop day 1 Vital signs normal Fundus firm Lochia moderate Legs negative No complaints

## 2012-07-24 ENCOUNTER — Encounter (HOSPITAL_COMMUNITY): Payer: Self-pay | Admitting: Obstetrics

## 2012-07-24 MED ORDER — HYDROCORTISONE 1 % EX CREA
TOPICAL_CREAM | Freq: Every day | CUTANEOUS | Status: DC
  Administered 2012-07-24: 10:00:00 via TOPICAL
  Filled 2012-07-24: qty 28

## 2012-07-24 NOTE — Progress Notes (Signed)
UR chart review completed.  

## 2012-07-24 NOTE — Progress Notes (Signed)
Pt reported was informed by dietary that they could not serve chocolate cake due to reported caffeine allergy. Patient clarified to RN that she has no problems with chocolate consumption, that "I eat it all the time." Dietary informed that patient requested chocolate cake.

## 2012-07-24 NOTE — Progress Notes (Signed)
Patient ID: Morgan Newton, female   DOB: Dec 10, 1985, 27 y.o.   MRN: [id number] Postop day 2 Vital signs normal Abdomen soft incision clean and dry lochia moderate legs negative doing well

## 2012-07-25 NOTE — Discharge Summary (Signed)
Obstetric Discharge Summary Reason for Admission: induction of labor Prenatal Procedures: none Intrapartum Procedures: cesarean: low cervical, transverse Postpartum Procedures: none Complications-Operative and Postpartum: none Hemoglobin  Date Value Range Status  07/23/2012 8.0* 12.0 - 15.0 g/dL Final     HCT  Date Value Range Status  07/23/2012 24.8* 36.0 - 46.0 % Final    Physical Exam:  General: alert Lochia: appropriate Uterine Fundus: firm Incision: healing well DVT Evaluation: No evidence of DVT seen on physical exam.  Discharge Diagnoses: Term Pregnancy-delivered  Discharge Information: Date: 07/25/2012 Activity: pelvic rest Diet: routine Medications: Percocet Condition: stable Instructions: refer to practice specific booklet Discharge to: home Follow-up Information    Call in 6 weeks to follow up.         Newborn Data: Live born female  Birth Weight: 7 lb 4.8 oz (3310 g) APGAR: 4, 9  Home with mother.  Morgan Newton A 07/25/2012, 7:49 AM

## 2012-08-02 ENCOUNTER — Inpatient Hospital Stay (HOSPITAL_COMMUNITY): Payer: Medicaid Other

## 2012-08-02 ENCOUNTER — Encounter (HOSPITAL_COMMUNITY): Payer: Self-pay | Admitting: *Deleted

## 2012-08-02 ENCOUNTER — Inpatient Hospital Stay (HOSPITAL_COMMUNITY)
Admission: AD | Admit: 2012-08-02 | Discharge: 2012-08-02 | Disposition: A | Discharge status: Home / Self Care | Payer: Medicaid Other | Source: Ambulatory Visit | Attending: Obstetrics | Admitting: Obstetrics

## 2012-08-02 DIAGNOSIS — O99893 Other specified diseases and conditions complicating puerperium: Secondary | ICD-10-CM | POA: Insufficient documentation

## 2012-08-02 DIAGNOSIS — R109 Unspecified abdominal pain: Secondary | ICD-10-CM | POA: Insufficient documentation

## 2012-08-02 DIAGNOSIS — R300 Dysuria: Secondary | ICD-10-CM

## 2012-08-02 LAB — URINALYSIS, ROUTINE W REFLEX MICROSCOPIC
Nitrite: NEGATIVE
Protein, ur: NEGATIVE mg/dL
Urobilinogen, UA: 0.2 mg/dL (ref 0.0–1.0)

## 2012-08-02 LAB — URINE MICROSCOPIC-ADD ON

## 2012-08-02 LAB — COMPREHENSIVE METABOLIC PANEL
ALT: 16 U/L (ref 0–35)
AST: 22 U/L (ref 0–37)
Calcium: 8.8 mg/dL (ref 8.4–10.5)
GFR calc Af Amer: >90 mL/min (ref >90)
Glucose, Bld: 64 mg/dL — ABNORMAL LOW (ref 70–99)
Sodium: 138 mEq/L (ref 135–145)
Total Protein: 6.5 g/dL (ref 6.0–8.3)

## 2012-08-02 LAB — CBC
HCT: 28.5 % — ABNORMAL LOW (ref 36.0–46.0)
MCV: 85.3 fL (ref 78.0–100.0)
RBC: 3.34 MIL/uL — ABNORMAL LOW (ref 3.87–5.11)
WBC: 26.9 10*3/uL — ABNORMAL HIGH (ref 4.0–10.5)

## 2012-08-02 MED ORDER — PHENAZOPYRIDINE HCL 200 MG PO TABS: 200.0000 mg | ORAL_TABLET | Freq: Three times a day (TID) | ORAL | Status: DC | PRN

## 2012-08-02 MED ORDER — GI COCKTAIL ~~LOC~~
30.0000 mL | Freq: Once | ORAL | Status: AC
  Administered 2012-08-02: 30 mL via ORAL
  Filled 2012-08-02: qty 30

## 2012-08-02 MED ORDER — SIMETHICONE 80 MG PO CHEW
160.0000 mg | CHEWABLE_TABLET | Freq: Once | ORAL | Status: AC
  Administered 2012-08-02: 160 mg via ORAL
  Filled 2012-08-02: qty 2

## 2012-08-02 MED ORDER — INTEGRA F 125-1 MG PO CAPS: 1.0000 | ORAL_CAPSULE | Freq: Every day | ORAL | Status: DC

## 2012-08-02 MED ORDER — CLINDAMYCIN HCL 150 MG PO CAPS: 150.0000 mg | ORAL_CAPSULE | Freq: Four times a day (QID) | ORAL | Status: DC

## 2012-08-02 NOTE — MAU Note (Signed)
Emergent c/s on 01/04.  Taking percocet as instructed - thinks it is too much and she is still in pain. Stomach is bothering- pain wise.  Pain with urination- was given pyridium- still painful past wk.  Also stool looks weird, not normal.

## 2012-08-02 NOTE — H&P (Signed)
Morgan Newton is an 27 y.o. female. G3P1, post partum 11 days that presents to the MAU with abdominal cramping and UTI symptoms.  The patient states that she was seen by Dr. Ruthann Cancer on 07/25/12 for UTI symptoms.  She was prescribed Pyridium for her symptoms but they have persisted.  She states that she has experienced dysuria without noticeable blood.  She experiences a burning pain during the entire course of urination.  The patient has not experienced any back pain but has experienced fever, chills, and night sweats over the last few days.  In addition to her urinary symptoms, the patient has experienced abdominal cramping that she describes as a 10/10 on the pain scale.  She states that she has had irregular stools since her C section, but has multiple, non diarrhea/non solid stools in the last few days.  The patient states the stool is not completely formed but is "very dark".  She has not noticed gross blood in the basin.  Her cramps are colicky in nature and occur every 45 min.  She states that she has taken all of her post partum Percocet (approx 80 tablets) in the last 11 days.    The patient states that she has been dizzy, lightheaded, experienced blurry vision, and edema in her lower extremities.  She has not had a H/A, SOB, chest pain, syncope, difficulty swallowing, CVA tenderness, rashes, petechiae, bruising, emesis, nausea, vomiting, or vaginal discharge.    Pertinent Gynecological History: Menses: post partum, irregular menses Blood transfusions: none Sexually transmitted diseases: past history: See admission note OB History: G3, P2   Menstrual History: Patient's last menstrual period was 10/16/2011.    Past Medical History  Diagnosis Date  . Asthma   . Anemia   . Heart murmur   . Genital herpes   . Depression   . Hypertension     PIH  . Pregnancy induced hypertension   . Infection     urinary tract infection  . PID (acute pelvic inflammatory disease)   . Abnormal Pap  smear 2012    colpo  . Chlamydia   . Gonorrhea   . Trichimoniasis   . Gestational diabetes     1st pregnancy-diet controlled    Past Surgical History  Procedure Date  . Ganglion cyst excision   . Cesarean section 07/22/2012    Procedure: CESAREAN SECTION;  Surgeon: Frederico Hamman, MD;  Location: [facility];  Service: Obstetrics;  Laterality: N/A;  Primary Cesarean Section     Family History  Problem Relation Age of Onset  . Other Neg Hx   . Hearing loss Neg Hx   . Hypertension Mother   . Thyroid disease Mother   . Hypertension Maternal Grandmother   . Thyroid disease Maternal Grandmother   . Hypertension Maternal Grandfather   . Asthma Father   . Asthma Maternal Aunt   . Asthma Paternal Grandmother     Social History:  reports that she has been smoking Cigarettes.  She has been smoking about .2 packs per day. She has never used smokeless tobacco. She reports that she does not drink alcohol or use illicit drugs.  Allergies:  Allergies  Allergen Reactions  . Penicillins Shortness Of Breath and Palpitations  . Caffeine Other (See Comments)    Heart murmur    Prescriptions prior to admission  Medication Sig Dispense Refill  . ibuprofen (ADVIL,MOTRIN) 200 MG tablet Take 200 mg by mouth every 6 (six) hours as needed. pain      .  oxyCODONE-acetaminophen (PERCOCET) 5-325 MG per tablet Take 2 tablets by mouth every 4 (four) hours as needed. pain        ROS  Blood pressure 152/88, pulse 66, temperature 98.6 F (37 C), temperature source Oral, resp. rate 16, height 5' (1.524 m), weight 178 lb (80.74 kg), last menstrual period 10/16/2011, currently breastfeeding. Physical Exam  Constitutional: She is oriented to person, place, and time. She appears well-developed and well-nourished.       Appears uncomfortable  HENT:  Head: Normocephalic.  Neck: Normal range of motion. Neck supple.  Cardiovascular: Normal rate and regular rhythm.   Respiratory: Effort normal and breath  sounds normal. No respiratory distress.  GI: Soft. There is tenderness in the epigastric area. There is no guarding and no CVA tenderness.       Incision site healing well; well approximated, no signs of infection; no palpable mass.  Genitourinary: No bleeding around the vagina.  Musculoskeletal: Normal range of motion. Edema: 1+ bilat pedal.  Neurological: She is alert and oriented to person, place, and time. She has normal reflexes.  Skin: Skin is warm and dry.    Results for orders placed during the hospital encounter of 08/02/12 (from the past 24 hour(s))  URINALYSIS, ROUTINE W REFLEX MICROSCOPIC     Status: Abnormal   Collection Time   08/02/12 10:24 AM      Component Value Range   Color, Urine YELLOW  YELLOW   APPearance CLEAR  CLEAR   Specific Gravity, Urine 1.020  1.005 - 1.030   pH 6.0  5.0 - 8.0   Glucose, UA NEGATIVE  NEGATIVE mg/dL   Hgb urine dipstick MODERATE (*) NEGATIVE   Bilirubin Urine SMALL (*) NEGATIVE   Ketones, ur >80 (*) NEGATIVE mg/dL   Protein, ur NEGATIVE  NEGATIVE mg/dL   Urobilinogen, UA 0.2  0.0 - 1.0 mg/dL   Nitrite NEGATIVE  NEGATIVE   Leukocytes, UA NEGATIVE  NEGATIVE  URINE MICROSCOPIC-ADD ON     Status: Normal   Collection Time   08/02/12 10:24 AM      Component Value Range   Squamous Epithelial / LPF RARE  RARE   WBC, UA 0-2  <3 WBC/hpf   RBC / HPF 0-2  <3 RBC/hpf   Bacteria, UA RARE  RARE  CBC     Status: Abnormal   Collection Time   08/02/12 12:38 PM      Component Value Range   WBC 26.9 (*) 4.0 - 10.5 K/uL   RBC 3.34 (*) 3.87 - 5.11 MIL/uL   Hemoglobin 8.8 (*) 12.0 - 15.0 g/dL   HCT 28.5 (*) 36.0 - 46.0 %   MCV 85.3  78.0 - 100.0 fL   MCH 26.3  26.0 - 34.0 pg   MCHC 30.9  30.0 - 36.0 g/dL   RDW 15.5  11.5 - 15.5 %   Platelets 600 (*) 150 - 400 K/uL  COMPREHENSIVE METABOLIC PANEL     Status: Abnormal   Collection Time   08/02/12 12:38 PM      Component Value Range   Sodium 138  135 - 145 mEq/L   Potassium 3.4 (*) 3.5 - 5.1 mEq/L    Chloride 102  96 - 112 mEq/L   CO2 22  19 - 32 mEq/L   Glucose, Bld 64 (*) 70 - 99 mg/dL   BUN 5 (*) 6 - 23 mg/dL   Creatinine, Ser 0.57  0.50 - 1.10 mg/dL   Calcium 8.8  8.4 - 10.5  mg/dL   Total Protein 6.5  6.0 - 8.3 g/dL   Albumin 2.3 (*) 3.5 - 5.2 g/dL   AST 22  0 - 37 U/L   ALT 16  0 - 35 U/L   Alkaline Phosphatase 130 (*) 39 - 117 U/L   Total Bilirubin 0.3  0.3 - 1.2 mg/dL   GFR calc non Af Amer >90  >90 mL/min   GFR calc Af Amer >90  >90 mL/min  AMYLASE     Status: Normal   Collection Time   08/02/12 12:38 PM      Component Value Range   Amylase 11  0 - 105 U/L  LIPASE, BLOOD     Status: Abnormal   Collection Time   08/02/12 12:38 PM      Component Value Range   Lipase 9 (*) 11 - 59 U/L    US Abdomen Complete  08/02/2012  *RADIOLOGY REPORT*  Clinical Data: Abdominal pain.  11 days post C-section.  ABDOMEN ULTRASOUND  Technique:  Complete abdominal ultrasound examination was performed including evaluation of the liver, gallbladder, bile ducts, pancreas, kidneys, spleen, IVC, and abdominal aorta.  Comparison: No comparison studies available.  Findings:  Gallbladder:  Small amount of gallbladder sludge without stones. No gallbladder wall thickening or pericholecystic fluid.  The sonographer reports no sonographic Murphy's sign.  Common Bile Duct:  Nondilated at 5 mm diameter.  Liver:  Normal.  No focal parenchymal abnormality.  No biliary dilation.  IVC:  Normal.  Pancreas:  Normal.  Spleen:  Normal.  Right kidney:  11.7 cm in long axis.  Normal.  Left kidney:  12.0 cm in long axis.  Normal.  Abdominal Aorta:  No aneurysm.  IMPRESSION: Minimal sludge in gallbladder.  No acute findings in the abdomen or pelvis.   Original Report Authenticated By: Misty Stanley, M.D.    [phone] Dr. Ruthann Cancer in to assess pt; states normal GI exam, incision - no signs of infection > RX Clindamycin, Pyridium, send urine for culture.  O&P when possible.   Assessment/Plan:  Dysuria Intestinal  Cramping Anemia  Urine Culture RX Clindamycin and Pyridieum RX Integra Stool O&P (outpatient) F/U if no improvement in 48 hrs  Jennet Maduro 08/02/2012, 3:21 PM

## 2012-08-03 ENCOUNTER — Other Ambulatory Visit (HOSPITAL_COMMUNITY): Payer: Self-pay | Admitting: Obstetrics

## 2012-08-03 ENCOUNTER — Other Ambulatory Visit: Payer: Self-pay | Admitting: Advanced Practice Midwife

## 2012-08-03 ENCOUNTER — Telehealth: Payer: Self-pay | Admitting: Advanced Practice Midwife

## 2012-08-03 ENCOUNTER — Ambulatory Visit (HOSPITAL_COMMUNITY)
Admission: RE | Admit: 2012-08-03 | Discharge: 2012-08-03 | Disposition: A | Discharge status: Home / Self Care | Payer: Medicaid Other | Source: Ambulatory Visit | Attending: Advanced Practice Midwife | Admitting: Advanced Practice Midwife

## 2012-08-03 ENCOUNTER — Encounter (HOSPITAL_COMMUNITY): Payer: Self-pay

## 2012-08-03 DIAGNOSIS — G8918 Other acute postprocedural pain: Secondary | ICD-10-CM

## 2012-08-03 DIAGNOSIS — R109 Unspecified abdominal pain: Secondary | ICD-10-CM | POA: Insufficient documentation

## 2012-08-03 DIAGNOSIS — D72829 Elevated white blood cell count, unspecified: Secondary | ICD-10-CM | POA: Insufficient documentation

## 2012-08-03 DIAGNOSIS — O99893 Other specified diseases and conditions complicating puerperium: Secondary | ICD-10-CM | POA: Insufficient documentation

## 2012-08-03 DIAGNOSIS — N949 Unspecified condition associated with female genital organs and menstrual cycle: Secondary | ICD-10-CM | POA: Insufficient documentation

## 2012-08-03 MED ORDER — IOHEXOL 300 MG/ML  SOLN
50.0000 mL | Freq: Once | INTRAMUSCULAR | Status: AC | PRN
  Administered 2012-08-03: 50 mL via ORAL

## 2012-08-03 MED ORDER — IOHEXOL 300 MG/ML  SOLN
100.0000 mL | Freq: Once | INTRAMUSCULAR | Status: AC | PRN
  Administered 2012-08-03: 100 mL via INTRAVENOUS

## 2012-08-03 NOTE — Telephone Encounter (Signed)
Dr. Ruthann Cancer requested that pt have outpatient CT for post-C/S abd pain and leukocytosis. Pt will come to Radiology ~1030 for CT abd/pelvis w/ contrast (per Dr Kris Hartmann, Radiology).

## 2012-08-05 ENCOUNTER — Inpatient Hospital Stay (HOSPITAL_COMMUNITY)
Admission: AD | Admit: 2012-08-05 | Discharge: 2012-08-05 | Disposition: A | Discharge status: Home / Self Care | Payer: Medicaid Other | Source: Ambulatory Visit | Attending: Obstetrics | Admitting: Obstetrics

## 2012-08-05 ENCOUNTER — Encounter (HOSPITAL_COMMUNITY): Payer: Self-pay | Admitting: Family

## 2012-08-05 DIAGNOSIS — O2390 Unspecified genitourinary tract infection in pregnancy, unspecified trimester: Secondary | ICD-10-CM | POA: Insufficient documentation

## 2012-08-05 DIAGNOSIS — N390 Urinary tract infection, site not specified: Secondary | ICD-10-CM | POA: Insufficient documentation

## 2012-08-05 DIAGNOSIS — O909 Complication of the puerperium, unspecified: Secondary | ICD-10-CM | POA: Insufficient documentation

## 2012-08-05 DIAGNOSIS — O900 Disruption of cesarean delivery wound: Secondary | ICD-10-CM

## 2012-08-05 LAB — URINE MICROSCOPIC-ADD ON: RBC / HPF: NONE SEEN RBC/hpf (ref <3)

## 2012-08-05 LAB — CBC
HCT: 27.7 % — ABNORMAL LOW (ref 36.0–46.0)
Hemoglobin: 8.6 g/dL — ABNORMAL LOW (ref 12.0–15.0)
MCH: 26.4 pg (ref 26.0–34.0)
MCHC: 31 g/dL (ref 30.0–36.0)
MCV: 85 fL (ref 78.0–100.0)
Platelets: 711 10*3/uL — ABNORMAL HIGH (ref 150–400)
RBC: 3.26 MIL/uL — ABNORMAL LOW (ref 3.87–5.11)
RDW: 16.3 % — ABNORMAL HIGH (ref 11.5–15.5)
WBC: 11 10*3/uL — ABNORMAL HIGH (ref 4.0–10.5)

## 2012-08-05 LAB — URINALYSIS, ROUTINE W REFLEX MICROSCOPIC
Glucose, UA: NEGATIVE mg/dL
Leukocytes, UA: NEGATIVE
Protein, ur: NEGATIVE mg/dL

## 2012-08-05 MED ORDER — NITROFURANTOIN MONOHYD MACRO 100 MG PO CAPS: 100.0000 mg | ORAL_CAPSULE | Freq: Two times a day (BID) | ORAL | Status: DC

## 2012-08-05 NOTE — MAU Note (Addendum)
Patient presents to MAU with c/o CS incision with yellowish/pinkish drainage noted at 1400 today after showering. Denies tenderness or pain at incision. Denies fever, chills.  CS was on 1/4.  Patient is breastfeeding.

## 2012-08-05 NOTE — MAU Note (Signed)
Pt reports she had started noticing leaking from incision today. Having some soreness to the area.

## 2012-08-05 NOTE — MAU Provider Note (Signed)
History     CSN: [id number]  Arrival date & time 08/05/12  1455   None     Chief Complaint  Patient presents with  . Drainage from Incision    (Consider location/radiation/quality/duration/timing/severity/associated sxs/prior treatment) HPIHeather Delane Ginger Newton is a 27 y.o. [id number] s/p primary C/S 07/22/12. She presents with leaking from her incision this afternoon. No increased pain. She may be having fever/chills, feels hot/cold, no thermometer. WBC 1/15 was 26.9.  CT 1/16 revealed 2.4 cm collection of fluid between uterus and bladder and  probable colitis.  She is awaiting referral to GI.  Past Medical History  Diagnosis Date  . Asthma   . Anemia   . Heart murmur   . Genital herpes   . Depression   . Hypertension     PIH  . Pregnancy induced hypertension   . Infection     urinary tract infection  . PID (acute pelvic inflammatory disease)   . Abnormal Pap smear 2012    colpo  . Chlamydia   . Gonorrhea   . Trichimoniasis     Past Surgical History  Procedure Date  . Ganglion cyst excision   . Cesarean section 07/22/2012    Procedure: CESAREAN SECTION;  Surgeon: Frederico Hamman, MD;  Location: [facility];  Service: Obstetrics;  Laterality: N/A;  Primary Cesarean Section     Family History  Problem Relation Age of Onset  . Other Neg Hx   . Hearing loss Neg Hx   . Hypertension Mother   . Thyroid disease Mother   . Hypertension Maternal Grandmother   . Thyroid disease Maternal Grandmother   . Hypertension Maternal Grandfather   . Asthma Father   . Asthma Maternal Aunt   . Asthma Paternal Grandmother     History  Substance Use Topics  . Smoking status: Current Every Day Smoker -- 0.2 packs/day    Types: Cigarettes  . Smokeless tobacco: Never Used  . Alcohol Use: No    OB History    Grav Para Term Preterm Abortions TAB SAB Ect Mult Living   3 3 2 1      2       Review of Systems  Constitutional: Positive for fever and chills.  Gastrointestinal: Positive for  diarrhea. Negative for nausea, vomiting, abdominal pain and abdominal distention.  Genitourinary: Negative for dysuria, urgency, frequency, vaginal discharge and pelvic pain.    Allergies  Penicillins and Caffeine  Home Medications  No current outpatient prescriptions on file.  BP 174/93  Pulse 54  Temp 98.1 F (36.7 C) (Oral)  Resp 18  Ht 4' 11"  (1.499 m)  Wt 173 lb 6.4 oz (78.654 kg)  BMI 35.02 kg/m2  LMP 10/16/2011  Breastfeeding? Yes  Physical Exam  Constitutional: She is oriented to person, place, and time. She appears well-developed and well-nourished.  Abdominal: Soft. She exhibits no mass. There is tenderness. There is no guarding.       Incision not separated, unable to express any fluid, sl tender, sl erythema  Musculoskeletal: Normal range of motion.  Neurological: She is alert and oriented to person, place, and time.  Skin: Skin is warm and dry.  Psychiatric: She has a normal mood and affect. Her behavior is normal.    ED Course  Procedures (including critical care time)   Labs Reviewed  URINALYSIS, [place]   Results for orders placed during the hospital encounter of 08/05/12 (from the past 24 hour(s))  URINALYSIS, ROUTINE W REFLEX MICROSCOPIC  Status: Abnormal   Collection Time   08/05/12  3:30 PM      Component Value Range   Color, Urine YELLOW  YELLOW   APPearance CLEAR  CLEAR   Specific Gravity, Urine <1.005 (*) 1.005 - 1.030   pH 6.5  5.0 - 8.0   Glucose, UA NEGATIVE  NEGATIVE mg/dL   Hgb urine dipstick TRACE (*) NEGATIVE   Bilirubin Urine NEGATIVE  NEGATIVE   Ketones, ur NEGATIVE  NEGATIVE mg/dL   Protein, ur NEGATIVE  NEGATIVE mg/dL   Urobilinogen, UA 0.2  0.0 - 1.0 mg/dL   Nitrite NEGATIVE  NEGATIVE   Leukocytes, UA NEGATIVE  NEGATIVE  URINE MICROSCOPIC-ADD ON     Status: Abnormal   Collection Time   08/05/12  3:30 PM      Component Value Range   Squamous Epithelial / LPF FEW (*) RARE   WBC, UA 3-6  <3 WBC/hpf    RBC / HPF    <3 RBC/hpf   Value: NO FORMED ELEMENTS SEEN ON URINE MICROSCOPIC EXAMINATION  CBC     Status: Abnormal   Collection Time   08/05/12  4:25 PM      Component Value Range   WBC 11.0 (*) 4.0 - 10.5 K/uL   RBC 3.26 (*) 3.87 - 5.11 MIL/uL   Hemoglobin 8.6 (*) 12.0 - 15.0 g/dL   HCT 27.7 (*) 36.0 - 46.0 %   MCV 85.0  78.0 - 100.0 fL   MCH 26.4  26.0 - 34.0 pg   MCHC 31.0  30.0 - 36.0 g/dL   RDW 16.3 (*) 11.5 - 15.5 %   Platelets 711 (*) 150 - 400 K/uL    No results found. ASSESSMENT:  Pt has been Rx Cleocin for UTI and having diarrhea, probable colitis per CT. Will stop Cleocin, and change to Macrobid No further drainage from incision, is not opened. WBC has dropped to nl range now  No diagnosis found.  PLAN:  Macrobid x 7 d  Increase protein in diet, get sleep when able toCall Dr Ruthann Cancer office Monday am for f/u on GI referral, incision appt with him next week too  MDM

## 2012-08-16 ENCOUNTER — Telehealth: Payer: Self-pay | Admitting: Obstetrics and Gynecology

## 2012-08-17 NOTE — Telephone Encounter (Signed)
TC to pt. Informed syringe and case will be at front desk. Informed OK that GElfoam came off this AM.  States no bleeding.  Advised only Vaseline not Neosporin ointment or circ site. Pt verbalizes comprehension.

## 2012-09-02 ENCOUNTER — Emergency Department (HOSPITAL_COMMUNITY)
Admission: EM | Admit: 2012-09-02 | Discharge: 2012-09-02 | Disposition: A | Discharge status: Home / Self Care | Payer: Medicaid Other | Attending: Emergency Medicine | Admitting: Emergency Medicine

## 2012-09-02 ENCOUNTER — Encounter (HOSPITAL_COMMUNITY): Payer: Self-pay | Admitting: *Deleted

## 2012-09-02 ENCOUNTER — Other Ambulatory Visit: Payer: Self-pay

## 2012-09-02 DIAGNOSIS — F17200 Nicotine dependence, unspecified, uncomplicated: Secondary | ICD-10-CM | POA: Insufficient documentation

## 2012-09-02 DIAGNOSIS — H53149 Visual discomfort, unspecified: Secondary | ICD-10-CM | POA: Insufficient documentation

## 2012-09-02 DIAGNOSIS — J45909 Unspecified asthma, uncomplicated: Secondary | ICD-10-CM | POA: Insufficient documentation

## 2012-09-02 DIAGNOSIS — G43909 Migraine, unspecified, not intractable, without status migrainosus: Secondary | ICD-10-CM | POA: Insufficient documentation

## 2012-09-02 DIAGNOSIS — I10 Essential (primary) hypertension: Secondary | ICD-10-CM | POA: Insufficient documentation

## 2012-09-02 DIAGNOSIS — Z87440 Personal history of urinary (tract) infections: Secondary | ICD-10-CM | POA: Insufficient documentation

## 2012-09-02 DIAGNOSIS — Z8679 Personal history of other diseases of the circulatory system: Secondary | ICD-10-CM | POA: Insufficient documentation

## 2012-09-02 DIAGNOSIS — Z8659 Personal history of other mental and behavioral disorders: Secondary | ICD-10-CM | POA: Insufficient documentation

## 2012-09-02 DIAGNOSIS — R51 Headache: Secondary | ICD-10-CM

## 2012-09-02 DIAGNOSIS — Z79899 Other long term (current) drug therapy: Secondary | ICD-10-CM | POA: Insufficient documentation

## 2012-09-02 DIAGNOSIS — R110 Nausea: Secondary | ICD-10-CM | POA: Insufficient documentation

## 2012-09-02 DIAGNOSIS — Z862 Personal history of diseases of the blood and blood-forming organs and certain disorders involving the immune mechanism: Secondary | ICD-10-CM | POA: Insufficient documentation

## 2012-09-02 DIAGNOSIS — Z8742 Personal history of other diseases of the female genital tract: Secondary | ICD-10-CM | POA: Insufficient documentation

## 2012-09-02 DIAGNOSIS — Z8619 Personal history of other infectious and parasitic diseases: Secondary | ICD-10-CM | POA: Insufficient documentation

## 2012-09-02 MED ORDER — METOCLOPRAMIDE HCL 5 MG/ML IJ SOLN
10.0000 mg | Freq: Once | INTRAMUSCULAR | Status: AC
  Administered 2012-09-02: 10 mg via INTRAVENOUS
  Filled 2012-09-02: qty 2

## 2012-09-02 MED ORDER — KETOROLAC TROMETHAMINE 30 MG/ML IJ SOLN
30.0000 mg | Freq: Once | INTRAMUSCULAR | Status: AC
  Administered 2012-09-02: 30 mg via INTRAVENOUS
  Filled 2012-09-02: qty 1

## 2012-09-02 MED ORDER — SODIUM CHLORIDE 0.9 % IV BOLUS (SEPSIS)
1000.0000 mL | Freq: Once | INTRAVENOUS | Status: AC
  Administered 2012-09-02: 1000 mL via INTRAVENOUS

## 2012-09-02 MED ORDER — DEXAMETHASONE SODIUM PHOSPHATE 10 MG/ML IJ SOLN
10.0000 mg | Freq: Once | INTRAMUSCULAR | Status: AC
  Administered 2012-09-02: 10 mg via INTRAVENOUS
  Filled 2012-09-02: qty 1

## 2012-09-02 MED ORDER — DIPHENHYDRAMINE HCL 50 MG/ML IJ SOLN
12.5000 mg | Freq: Once | INTRAMUSCULAR | Status: AC
  Administered 2012-09-02: 12.5 mg via INTRAVENOUS
  Filled 2012-09-02: qty 1

## 2012-09-02 NOTE — ED Notes (Signed)
Pt states she recently had a baby 1 month ago, since then she's had small headaches but the past 2 weeks she's been having migraines, pt states tylenol and other pain medications has not been helping, states having sensitivity to light and sound, nausea, denies vomiting.

## 2012-09-02 NOTE — ED Provider Notes (Signed)
History     CSN: [id number]  Arrival date & time 09/02/12  1249   First MD Initiated Contact with Patient 09/02/12 1331      Chief Complaint  Patient presents with  . Migraine    (Consider location/radiation/quality/duration/timing/severity/associated sxs/prior treatment) HPI  27 year old female with history of hypertension, anemia, and depression presents complaining of headache. Patient reports gradual headache for the past 2 weeks. Reported as a throbbing sensation to L4 and radiates to the back of her head, constant, moderate to severe , nonradiating, with nausea, light and sound sensitivity. She has tried over-the-counter medication including Tylenol and ibuprofen without relief. She denies having similar headaches in the past. She denies fever, chills, neck stiffness, sneezing, coughing, runny nose, sore throat, chest pain, shortness of breath, numbness, weakness, rash. She recently delivered a baby 6 weeks ago. She is currently not breast-feeding.  Past Medical History  Diagnosis Date  . Asthma   . Anemia   . Heart murmur   . Genital herpes   . Depression   . Hypertension     PIH  . Pregnancy induced hypertension   . Infection     urinary tract infection  . PID (acute pelvic inflammatory disease)   . Abnormal Pap smear 2012    colpo  . Chlamydia   . Gonorrhea   . Trichimoniasis     Past Surgical History  Procedure Laterality Date  . Ganglion cyst excision    . Cesarean section  07/22/2012    Procedure: CESAREAN SECTION;  Surgeon: Frederico Hamman, MD;  Location: [facility];  Service: Obstetrics;  Laterality: N/A;  Primary Cesarean Section     Family History  Problem Relation Age of Onset  . Other Neg Hx   . Hearing loss Neg Hx   . Hypertension Mother   . Thyroid disease Mother   . Hypertension Maternal Grandmother   . Thyroid disease Maternal Grandmother   . Hypertension Maternal Grandfather   . Asthma Father   . Asthma Maternal Aunt   . Asthma Paternal  Grandmother     History  Substance Use Topics  . Smoking status: Current Every Day Smoker -- 0.20 packs/day    Types: Cigarettes  . Smokeless tobacco: Never Used  . Alcohol Use: No    OB History   Grav Para Term Preterm Abortions TAB SAB Ect Mult Living   3 3 2 1      2       Review of Systems  Constitutional:       10 Systems reviewed and all are negative for acute change except as noted in the HPI.     Allergies  Penicillins and Caffeine  Home Medications   Current Outpatient Rx  Name  Route  Sig  Dispense  Refill  . bismuth subsalicylate (PEPTO BISMOL) 262 MG/15ML suspension   Oral   Take 30 mLs by mouth every 6 (six) hours as needed. For colitis symptoms         . Fe Fum-FePoly-FA-Vit C-Vit B3 (INTEGRA F) 125-1 MG CAPS   Oral   Take 1 tablet by mouth daily.   30 capsule   1   . ibuprofen (ADVIL,MOTRIN) 200 MG tablet   Oral   Take 200 mg by mouth every 6 (six) hours as needed. pain         . nitrofurantoin, macrocrystal-monohydrate, (MACROBID) 100 MG capsule   Oral   Take 1 capsule (100 mg total) by mouth 2 (two) times daily.  14 capsule   0   . phenazopyridine (PYRIDIUM) 200 MG tablet   Oral   Take 1 tablet (200 mg total) by mouth 3 (three) times daily as needed for pain (urethral spasm).   10 tablet   1     BP 159/89  Pulse 55  Temp(Src) 98.2 F (36.8 C) (Oral)  Resp 18  SpO2 100%  LMP 08/28/2012  Physical Exam  Nursing note and vitals reviewed. Constitutional: She is oriented to person, place, and time. She appears well-developed and well-nourished. No distress.  Awake, alert, nontoxic appearance  HENT:  Head: Atraumatic.  Right Ear: External ear normal.  Left Ear: External ear normal.  Mouth/Throat: Oropharynx is clear and moist.  Eyes: Conjunctivae and EOM are normal. Pupils are equal, round, and reactive to light. Right eye exhibits no discharge. Left eye exhibits no discharge.  Neck: Neck supple.  No nuchal rigidity.   Cardiovascular: Normal rate and regular rhythm.   Pulmonary/Chest: Effort normal. No respiratory distress. She exhibits no tenderness.  Abdominal: Soft. There is no tenderness. There is no rebound.  Musculoskeletal: She exhibits no edema and no tenderness.  ROM appears intact, no obvious focal weakness  Lymphadenopathy:    She has no cervical adenopathy.  Neurological: She is alert and oriented to person, place, and time. She has normal strength. No cranial nerve deficit or sensory deficit. GCS eye subscore is 4. GCS verbal subscore is 5. GCS motor subscore is 6.  Mental status and motor strength appears intact  Skin: No rash noted.  Psychiatric: She has a normal mood and affect.    ED Course  Procedures (including critical care time)  Labs Reviewed - No data to display No results found.   No diagnosis found.  1:47 PM Patient was seen and evaluated by me for headache. She has no red flags. Low suspicion for meningitis, stroke, or malignant hypertension.  Pt will benefit from migraine cocktail.  She is currently not breast feeding.   3:56 PM Although her headache has not fully resolve, she reports significant improvement. She patient is stable for discharge. Resource given for further followup. Return precautions given.  Pt's BP is elevated and will need to have it recheck by her PCP.  Pt is aware.    BP 168/87  Pulse 50  Temp(Src) 98.2 F (36.8 C) (Oral)  Resp 16  SpO2 100%  LMP 08/28/2012  1. headache MDM         Domenic Moras, PA-C 09/02/12 1557

## 2012-09-03 NOTE — ED Provider Notes (Signed)
Medical screening examination/treatment/procedure(s) were performed by non-physician practitioner and as supervising physician I was immediately available for consultation/collaboration.   Wandra Arthurs, MD 09/03/12 1051

## 2013-05-24 ENCOUNTER — Other Ambulatory Visit: Payer: Self-pay

## 2014-05-06 ENCOUNTER — Emergency Department (HOSPITAL_BASED_OUTPATIENT_CLINIC_OR_DEPARTMENT_OTHER)
Admission: EM | Admit: 2014-05-06 | Discharge: 2014-05-06 | Disposition: A | Discharge status: Home / Self Care | Payer: Medicaid Other | Attending: Emergency Medicine | Admitting: Emergency Medicine

## 2014-05-06 ENCOUNTER — Encounter (HOSPITAL_BASED_OUTPATIENT_CLINIC_OR_DEPARTMENT_OTHER): Payer: Self-pay | Admitting: Emergency Medicine

## 2014-05-06 ENCOUNTER — Emergency Department (HOSPITAL_BASED_OUTPATIENT_CLINIC_OR_DEPARTMENT_OTHER): Payer: Medicaid Other

## 2014-05-06 DIAGNOSIS — Z8659 Personal history of other mental and behavioral disorders: Secondary | ICD-10-CM | POA: Insufficient documentation

## 2014-05-06 DIAGNOSIS — R5383 Other fatigue: Secondary | ICD-10-CM | POA: Insufficient documentation

## 2014-05-06 DIAGNOSIS — R079 Chest pain, unspecified: Secondary | ICD-10-CM | POA: Insufficient documentation

## 2014-05-06 DIAGNOSIS — J45901 Unspecified asthma with (acute) exacerbation: Secondary | ICD-10-CM | POA: Insufficient documentation

## 2014-05-06 DIAGNOSIS — Z8619 Personal history of other infectious and parasitic diseases: Secondary | ICD-10-CM | POA: Insufficient documentation

## 2014-05-06 DIAGNOSIS — Z720 Tobacco use: Secondary | ICD-10-CM | POA: Insufficient documentation

## 2014-05-06 DIAGNOSIS — I10 Essential (primary) hypertension: Secondary | ICD-10-CM | POA: Insufficient documentation

## 2014-05-06 DIAGNOSIS — J40 Bronchitis, not specified as acute or chronic: Secondary | ICD-10-CM

## 2014-05-06 DIAGNOSIS — R011 Cardiac murmur, unspecified: Secondary | ICD-10-CM | POA: Insufficient documentation

## 2014-05-06 DIAGNOSIS — Z862 Personal history of diseases of the blood and blood-forming organs and certain disorders involving the immune mechanism: Secondary | ICD-10-CM | POA: Insufficient documentation

## 2014-05-06 DIAGNOSIS — Z880 Allergy status to penicillin: Secondary | ICD-10-CM | POA: Insufficient documentation

## 2014-05-06 MED ORDER — HYDROCODONE-HOMATROPINE 5-1.5 MG/5ML PO SYRP: 5.0000 mL | ORAL_SOLUTION | Freq: Four times a day (QID) | ORAL | Status: DC | PRN

## 2014-05-06 MED ORDER — IPRATROPIUM-ALBUTEROL 0.5-2.5 (3) MG/3ML IN SOLN
3.0000 mL | Freq: Once | RESPIRATORY_TRACT | Status: AC
  Administered 2014-05-06: 3 mL via RESPIRATORY_TRACT
  Filled 2014-05-06: qty 3

## 2014-05-06 MED ORDER — PREDNISONE 20 MG PO TABS: ORAL_TABLET | ORAL | Status: DC

## 2014-05-06 MED ORDER — ALBUTEROL SULFATE HFA 108 (90 BASE) MCG/ACT IN AERS
1.0000 | INHALATION_SPRAY | RESPIRATORY_TRACT | Status: DC | PRN
  Administered 2014-05-06: 2 via RESPIRATORY_TRACT
  Filled 2014-05-06: qty 6.7

## 2014-05-06 NOTE — ED Notes (Signed)
Onset of cough and chest wall pain this am

## 2014-05-06 NOTE — ED Notes (Signed)
Patient transported to X-ray 

## 2014-05-06 NOTE — Discharge Instructions (Signed)
Upper Respiratory Infection, Adult An upper respiratory infection (URI) is also sometimes known as the common cold. The upper respiratory tract includes the nose, sinuses, throat, trachea, and bronchi. Bronchi are the airways leading to the lungs. Most people improve within 1 week, but symptoms can last up to 2 weeks. A residual cough may last even longer.  CAUSES Many different viruses can infect the tissues lining the upper respiratory tract. The tissues become irritated and inflamed and often become very moist. Mucus production is also common. A cold is contagious. You can easily spread the virus to others by oral contact. This includes kissing, sharing a glass, coughing, or sneezing. Touching your mouth or nose and then touching a surface, which is then touched by another person, can also spread the virus. SYMPTOMS  Symptoms typically develop 1 to 3 days after you come in contact with a cold virus. Symptoms vary from person to person. They may include:  Runny nose.  Sneezing.  Nasal congestion.  Sinus irritation.  Sore throat.  Loss of voice (laryngitis).  Cough.  Fatigue.  Muscle aches.  Loss of appetite.  Headache.  Low-grade fever. DIAGNOSIS  You might diagnose your own cold based on familiar symptoms, since most people get a cold 2 to 3 times a year. Your caregiver can confirm this based on your exam. Most importantly, your caregiver can check that your symptoms are not due to another disease such as strep throat, sinusitis, pneumonia, asthma, or epiglottitis. Blood tests, throat tests, and X-rays are not necessary to diagnose a common cold, but they may sometimes be helpful in excluding other more serious diseases. Your caregiver will decide if any further tests are required. RISKS AND COMPLICATIONS  You may be at risk for a more severe case of the common cold if you smoke cigarettes, have chronic heart disease (such as heart failure) or lung disease (such as asthma), or if  you have a weakened immune system. The very young and very old are also at risk for more serious infections. Bacterial sinusitis, middle ear infections, and bacterial pneumonia can complicate the common cold. The common cold can worsen asthma and chronic obstructive pulmonary disease (COPD). Sometimes, these complications can require emergency medical care and may be life-threatening. PREVENTION  The best way to protect against getting a cold is to practice good hygiene. Avoid oral or hand contact with people with cold symptoms. Wash your hands often if contact occurs. There is no clear evidence that vitamin C, vitamin E, echinacea, or exercise reduces the chance of developing a cold. However, it is always recommended to get plenty of rest and practice good nutrition. TREATMENT  Treatment is directed at relieving symptoms. There is no cure. Antibiotics are not effective, because the infection is caused by a virus, not by bacteria. Treatment may include:  Increased fluid intake. Sports drinks offer valuable electrolytes, sugars, and fluids.  Breathing heated mist or steam (vaporizer or shower).  Eating chicken soup or other clear broths, and maintaining good nutrition.  Getting plenty of rest.  Using gargles or lozenges for comfort.  Controlling fevers with ibuprofen or acetaminophen as directed by your caregiver.  Increasing usage of your inhaler if you have asthma. Zinc gel and zinc lozenges, taken in the first 24 hours of the common cold, can shorten the duration and lessen the severity of symptoms. Pain medicines may help with fever, muscle aches, and throat pain. A variety of non-prescription medicines are available to treat congestion and runny nose. Your caregiver   can make recommendations and may suggest nasal or lung inhalers for other symptoms.  HOME CARE INSTRUCTIONS   Only take over-the-counter or prescription medicines for pain, discomfort, or fever as directed by your  caregiver.  Use a warm mist humidifier or inhale steam from a shower to increase air moisture. This may keep secretions moist and make it easier to breathe.  Drink enough water and fluids to keep your urine clear or pale yellow.  Rest as needed.  Return to work when your temperature has returned to normal or as your caregiver advises. You may need to stay home longer to avoid infecting others. You can also use a face mask and careful hand washing to prevent spread of the virus. SEEK MEDICAL CARE IF:   After the first few days, you feel you are getting worse rather than better.  You need your caregiver's advice about medicines to control symptoms.  You develop chills, worsening shortness of breath, or brown or red sputum. These may be signs of pneumonia.  You develop yellow or brown nasal discharge or pain in the face, especially when you bend forward. These may be signs of sinusitis.  You develop a fever, swollen neck glands, pain with swallowing, or white areas in the back of your throat. These may be signs of strep throat. SEEK IMMEDIATE MEDICAL CARE IF:   You have a fever.  You develop severe or persistent headache, ear pain, sinus pain, or chest pain.  You develop wheezing, a prolonged cough, cough up blood, or have a change in your usual mucus (if you have chronic lung disease).  You develop sore muscles or a stiff neck. Document Released: 12/29/2000 Document Revised: 09/27/2011 Document Reviewed: 10/10/2013 [employer] Patient Information 2015 [employer]. This information is not intended to replace advice given to you by your health care provider. Make sure you discuss any questions you have with your health care provider.  

## 2014-05-06 NOTE — ED Provider Notes (Addendum)
CSN: [id number]     Arrival date & time 05/06/14  1055 History   First MD Initiated Contact with Patient 05/06/14 1143     Chief Complaint  Patient presents with  . Cough     (Consider location/radiation/quality/duration/timing/severity/associated sxs/prior Treatment) HPI Comments: Patient presents with cough and wheezing. She states that her last 2 or 3 days she's had some soreness across her chest and wheezing with shortness of breath. She's also had a cough that is productive of brown sputum. She denies he fevers or chills. She states her chest pain is worse with coughing and movement. She denies any leg swelling. She's not on oral contraceptives. She's PERC negative.  She denies runny nose or nasal congestion.  She has a history of childhood asthma but hasn't had problems in a number of years. She smokes occasionally.  Patient is a 28 y.o. female presenting with cough.  Cough Associated symptoms: chest pain, shortness of breath and wheezing   Associated symptoms: no chills, no diaphoresis, no fever, no headaches, no rash and no rhinorrhea     Past Medical History  Diagnosis Date  . Asthma   . Anemia   . Heart murmur   . Genital herpes   . Depression   . Hypertension     PIH  . Pregnancy induced hypertension   . Infection     urinary tract infection  . PID (acute pelvic inflammatory disease)   . Abnormal Pap smear 2012    colpo  . Chlamydia   . Gonorrhea   . Trichimoniasis    Past Surgical History  Procedure Laterality Date  . Ganglion cyst excision    . Cesarean section  07/22/2012    Procedure: CESAREAN SECTION;  Surgeon: Frederico Hamman, MD;  Location: [facility];  Service: Obstetrics;  Laterality: N/A;  Primary Cesarean Section    Family History  Problem Relation Age of Onset  . Other Neg Hx   . Hearing loss Neg Hx   . Hypertension Mother   . Thyroid disease Mother   . Hypertension Maternal Grandmother   . Thyroid disease Maternal Grandmother   . Hypertension  Maternal Grandfather   . Asthma Father   . Asthma Maternal Aunt   . Asthma Paternal Grandmother    History  Substance Use Topics  . Smoking status: Current Some Day Smoker -- 0.20 packs/day    Types: Cigarettes  . Smokeless tobacco: Never Used  . Alcohol Use: No   OB History   Grav Para Term Preterm Abortions TAB SAB Ect Mult Living   3 3 2 1      2      Review of Systems  Constitutional: Positive for fatigue. Negative for fever, chills and diaphoresis.  HENT: Negative for congestion, rhinorrhea and sneezing.   Eyes: Negative.   Respiratory: Positive for cough, shortness of breath and wheezing. Negative for chest tightness.   Cardiovascular: Positive for chest pain. Negative for leg swelling.  Gastrointestinal: Negative for nausea, vomiting, abdominal pain, diarrhea and blood in stool.  Genitourinary: Negative for frequency, hematuria, flank pain and difficulty urinating.  Musculoskeletal: Negative for arthralgias and back pain.  Skin: Negative for rash.  Neurological: Negative for dizziness, speech difficulty, weakness, numbness and headaches.      Allergies  Penicillins and Caffeine  Home Medications   Prior to Admission medications   Medication Sig Start Date End Date Taking? Authorizing Provider  acetaminophen (TYLENOL) 500 MG tablet Take 500 mg by mouth every 6 (six) hours as  needed for pain.    Historical Provider, MD  HYDROcodone-homatropine (HYCODAN) 5-1.5 MG/5ML syrup Take 5 mLs by mouth every 6 (six) hours as needed for cough. 05/06/14   Malvin Johns, MD  ibuprofen (ADVIL,MOTRIN) 200 MG tablet Take 200 mg by mouth every 6 (six) hours as needed. pain    Historical Provider, MD  predniSONE (DELTASONE) 20 MG tablet 3 tabs po day one, then 2 po daily x 4 days 05/06/14   Malvin Johns, MD   BP 163/83  Pulse 130  Temp(Src) 98.1 F (36.7 C) (Oral)  Resp 18  SpO2 100%  LMP 04/04/2014 Physical Exam  Constitutional: She is oriented to person, place, and time. She  appears well-developed and well-nourished.  HENT:  Head: Normocephalic and atraumatic.  Mouth/Throat: Oropharynx is clear and moist.  Eyes: Pupils are equal, round, and reactive to light.  Neck: Normal range of motion. Neck supple.  Cardiovascular: Normal rate, regular rhythm and normal heart sounds.   Pulmonary/Chest: Effort normal. No respiratory distress. She has wheezes. She has no rales. She exhibits tenderness (diffuse TTP throughout bilateral chest wall).  Abdominal: Soft. Bowel sounds are normal. There is no tenderness. There is no rebound and no guarding.  Musculoskeletal: Normal range of motion. She exhibits no edema.  No calf tenderness  Lymphadenopathy:    She has no cervical adenopathy.  Neurological: She is alert and oriented to person, place, and time.  Skin: Skin is warm and dry. No rash noted.  Psychiatric: She has a normal mood and affect.    ED Course  Procedures (including critical care time) Labs Review Labs Reviewed - No data to display  Imaging Review Dg Chest 2 View  05/06/2014   CLINICAL DATA:  Cough and congestion.  Shortness of breath.  EXAM: CHEST  2 VIEW  COMPARISON:  None.  FINDINGS: The heart size and mediastinal contours are within normal limits. Both lungs are clear. The visualized skeletal structures are unremarkable.  IMPRESSION: Negative two view chest.   Electronically Signed   By: Lawrence Santiago M.D.   On: 05/06/2014 11:53     EKG Interpretation   Date/Time:  Monday May 06 2014 11:03:31 EDT Ventricular Rate:  91 PR Interval:  126 QRS Duration: 72 QT Interval:  322 QTC Calculation: 396 R Axis:   74 Text Interpretation:  Normal sinus rhythm Normal ECG similar to EKG from  2/01 Confirmed by Dallana Mavity  MD, Navraj Dreibelbis ([id number]) on 05/06/2014 4:26:19 PM      MDM   Final diagnoses:  Bronchitis    There is no evidence of pneumonia. Her chest pain is reproducible on palpation and seems to be musculoskeletal in origin. Her symptoms are  consistent with bronchitis. There is no evidence of pneumothorax. She has no suggestions of pulmonary embolus. She was given albuterol inhaler to use as well as a prescription for prednisone and Hycodan cough syrup. She was advised to return if her symptoms worsen or are not improving.    Malvin Johns, MD 05/06/14 [place], MD 05/06/14 [phone]

## 2014-05-06 NOTE — ED Notes (Signed)
MD at bedside with patient with this RN

## 2014-05-20 ENCOUNTER — Encounter (HOSPITAL_BASED_OUTPATIENT_CLINIC_OR_DEPARTMENT_OTHER): Payer: Self-pay | Admitting: Emergency Medicine

## 2014-08-27 ENCOUNTER — Encounter (HOSPITAL_BASED_OUTPATIENT_CLINIC_OR_DEPARTMENT_OTHER): Payer: Self-pay | Admitting: *Deleted

## 2014-08-27 ENCOUNTER — Emergency Department (HOSPITAL_BASED_OUTPATIENT_CLINIC_OR_DEPARTMENT_OTHER)
Admission: EM | Admit: 2014-08-27 | Discharge: 2014-08-27 | Disposition: A | Discharge status: Home / Self Care | Payer: Medicaid Other | Attending: Emergency Medicine | Admitting: Emergency Medicine

## 2014-08-27 ENCOUNTER — Emergency Department (HOSPITAL_BASED_OUTPATIENT_CLINIC_OR_DEPARTMENT_OTHER): Payer: Medicaid Other

## 2014-08-27 DIAGNOSIS — J40 Bronchitis, not specified as acute or chronic: Secondary | ICD-10-CM

## 2014-08-27 DIAGNOSIS — Z8742 Personal history of other diseases of the female genital tract: Secondary | ICD-10-CM | POA: Diagnosis not present

## 2014-08-27 DIAGNOSIS — Z862 Personal history of diseases of the blood and blood-forming organs and certain disorders involving the immune mechanism: Secondary | ICD-10-CM | POA: Diagnosis not present

## 2014-08-27 DIAGNOSIS — Z720 Tobacco use: Secondary | ICD-10-CM | POA: Diagnosis not present

## 2014-08-27 DIAGNOSIS — R05 Cough: Secondary | ICD-10-CM | POA: Diagnosis present

## 2014-08-27 DIAGNOSIS — J45901 Unspecified asthma with (acute) exacerbation: Secondary | ICD-10-CM | POA: Insufficient documentation

## 2014-08-27 DIAGNOSIS — Z8659 Personal history of other mental and behavioral disorders: Secondary | ICD-10-CM | POA: Diagnosis not present

## 2014-08-27 DIAGNOSIS — R011 Cardiac murmur, unspecified: Secondary | ICD-10-CM | POA: Insufficient documentation

## 2014-08-27 DIAGNOSIS — Z880 Allergy status to penicillin: Secondary | ICD-10-CM | POA: Diagnosis not present

## 2014-08-27 DIAGNOSIS — Z87440 Personal history of urinary (tract) infections: Secondary | ICD-10-CM | POA: Diagnosis not present

## 2014-08-27 MED ORDER — ALBUTEROL SULFATE HFA 108 (90 BASE) MCG/ACT IN AERS: 1.0000 | INHALATION_SPRAY | Freq: Four times a day (QID) | RESPIRATORY_TRACT | Status: DC | PRN

## 2014-08-27 MED ORDER — PREDNISONE 10 MG PO TABS: 20.0000 mg | ORAL_TABLET | Freq: Every day | ORAL | Status: DC

## 2014-08-27 NOTE — Discharge Instructions (Signed)

## 2014-08-27 NOTE — ED Notes (Signed)
Pt c/o URI symptoms x 1 week

## 2014-08-27 NOTE — ED Provider Notes (Signed)
CSN: [id number]     Arrival date & time 08/27/14  1719 History   First MD Initiated Contact with Patient 08/27/14 1736     Chief Complaint  Patient presents with  . URI     (Consider location/radiation/quality/duration/timing/severity/associated sxs/prior Treatment) HPI Comments: Patient here with URI symptoms 1 week worse last 3 days. Notes nonproductive cough without fever or chills. No vomiting or diarrhea. Slight sore throat but no ear pain. Has had some wheezing and she does use tobacco products. History of symptoms similar to this associated bronchitis. Has been using over-the-counter medications without relief. Denies any urinary symptoms.  Patient is a 29 y.o. female presenting with URI. The history is provided by the patient.  URI   Past Medical History  Diagnosis Date  . Asthma   . Anemia   . Heart murmur   . Genital herpes   . Depression   . Hypertension     PIH  . Pregnancy induced hypertension   . Infection     urinary tract infection  . PID (acute pelvic inflammatory disease)   . Abnormal Pap smear 2012    colpo  . Chlamydia   . Gonorrhea   . Trichimoniasis    Past Surgical History  Procedure Laterality Date  . Ganglion cyst excision    . Cesarean section  07/22/2012    Procedure: CESAREAN SECTION;  Surgeon: Frederico Hamman, MD;  Location: [facility];  Service: Obstetrics;  Laterality: N/A;  Primary Cesarean Section    Family History  Problem Relation Age of Onset  . Other Neg Hx   . Hearing loss Neg Hx   . Hypertension Mother   . Thyroid disease Mother   . Hypertension Maternal Grandmother   . Thyroid disease Maternal Grandmother   . Hypertension Maternal Grandfather   . Asthma Father   . Asthma Maternal Aunt   . Asthma Paternal Grandmother    History  Substance Use Topics  . Smoking status: Current Some Day Smoker -- 0.50 packs/day    Types: Cigarettes  . Smokeless tobacco: Never Used  . Alcohol Use: No   OB History    Gravida Para Term  Preterm AB TAB SAB Ectopic Multiple Living   3 3 2 1      2      Review of Systems  All other systems reviewed and are negative.     Allergies  Penicillins and Caffeine  Home Medications   Prior to Admission medications   Medication Sig Start Date End Date Taking? Authorizing Provider  ibuprofen (ADVIL,MOTRIN) 200 MG tablet Take 200 mg by mouth every 6 (six) hours as needed. pain    Historical Provider, MD   BP 132/74 mmHg  Pulse 97  Temp(Src) 99.1 F (37.3 C) (Oral)  Resp 16  Ht 4' 11.5" (1.511 m)  Wt 180 lb (81.647 kg)  BMI 35.76 kg/m2  SpO2 99%  LMP 08/27/2014 Physical Exam  Constitutional: She is oriented to person, place, and time. She appears well-developed and well-nourished.  Non-toxic appearance. No distress.  HENT:  Head: Normocephalic and atraumatic.  Eyes: Conjunctivae, EOM and lids are normal. Pupils are equal, round, and reactive to light.  Neck: Normal range of motion. Neck supple. No tracheal deviation present. No thyroid mass present.  Cardiovascular: Normal rate, regular rhythm and normal heart sounds.  Exam reveals no gallop.   No murmur heard. Pulmonary/Chest: Effort normal. No stridor. No respiratory distress. She has no decreased breath sounds. She has wheezes. She has no  rhonchi. She has no rales.  Abdominal: Soft. Normal appearance and bowel sounds are normal. She exhibits no distension. There is no tenderness. There is no rebound and no CVA tenderness.  Musculoskeletal: Normal range of motion. She exhibits no edema or tenderness.  Neurological: She is alert and oriented to person, place, and time. She has normal strength. No cranial nerve deficit or sensory deficit. GCS eye subscore is 4. GCS verbal subscore is 5. GCS motor subscore is 6.  Skin: Skin is warm and dry. No abrasion and no rash noted.  Psychiatric: She has a normal mood and affect. Her speech is normal and behavior is normal.  Nursing note and vitals reviewed.   ED Course  Procedures  (including critical care time) Labs Review Labs Reviewed - No data to display  Imaging Review No results found.   EKG Interpretation None      MDM   Final diagnoses:  None    Patient be treated for bronchitis at this time with prednisone and albuterol.    Leota Jacobsen, MD 08/27/14 [phone]

## 2014-08-27 NOTE — ED Notes (Signed)
MD at bedside. 

## 2014-11-25 ENCOUNTER — Emergency Department (HOSPITAL_COMMUNITY)
Admission: EM | Admit: 2014-11-25 | Discharge: 2014-11-25 | Disposition: A | Discharge status: Home / Self Care | Payer: Medicaid Other | Attending: Emergency Medicine | Admitting: Emergency Medicine

## 2014-11-25 ENCOUNTER — Emergency Department (HOSPITAL_COMMUNITY): Payer: Medicaid Other

## 2014-11-25 ENCOUNTER — Encounter (HOSPITAL_COMMUNITY): Payer: Self-pay | Admitting: Emergency Medicine

## 2014-11-25 DIAGNOSIS — R61 Generalized hyperhidrosis: Secondary | ICD-10-CM | POA: Diagnosis not present

## 2014-11-25 DIAGNOSIS — Z720 Tobacco use: Secondary | ICD-10-CM | POA: Insufficient documentation

## 2014-11-25 DIAGNOSIS — Z87440 Personal history of urinary (tract) infections: Secondary | ICD-10-CM | POA: Insufficient documentation

## 2014-11-25 DIAGNOSIS — J45901 Unspecified asthma with (acute) exacerbation: Secondary | ICD-10-CM | POA: Diagnosis not present

## 2014-11-25 DIAGNOSIS — Z3202 Encounter for pregnancy test, result negative: Secondary | ICD-10-CM | POA: Insufficient documentation

## 2014-11-25 DIAGNOSIS — Z880 Allergy status to penicillin: Secondary | ICD-10-CM | POA: Insufficient documentation

## 2014-11-25 DIAGNOSIS — Z79899 Other long term (current) drug therapy: Secondary | ICD-10-CM | POA: Diagnosis not present

## 2014-11-25 DIAGNOSIS — R0602 Shortness of breath: Secondary | ICD-10-CM | POA: Insufficient documentation

## 2014-11-25 DIAGNOSIS — R1110 Vomiting, unspecified: Secondary | ICD-10-CM | POA: Insufficient documentation

## 2014-11-25 DIAGNOSIS — R079 Chest pain, unspecified: Secondary | ICD-10-CM

## 2014-11-25 DIAGNOSIS — Z7952 Long term (current) use of systemic steroids: Secondary | ICD-10-CM | POA: Diagnosis not present

## 2014-11-25 DIAGNOSIS — Z8619 Personal history of other infectious and parasitic diseases: Secondary | ICD-10-CM | POA: Insufficient documentation

## 2014-11-25 DIAGNOSIS — Z862 Personal history of diseases of the blood and blood-forming organs and certain disorders involving the immune mechanism: Secondary | ICD-10-CM | POA: Insufficient documentation

## 2014-11-25 DIAGNOSIS — R0789 Other chest pain: Secondary | ICD-10-CM | POA: Diagnosis not present

## 2014-11-25 DIAGNOSIS — R011 Cardiac murmur, unspecified: Secondary | ICD-10-CM | POA: Insufficient documentation

## 2014-11-25 DIAGNOSIS — I499 Cardiac arrhythmia, unspecified: Secondary | ICD-10-CM | POA: Diagnosis not present

## 2014-11-25 DIAGNOSIS — Z8659 Personal history of other mental and behavioral disorders: Secondary | ICD-10-CM | POA: Diagnosis not present

## 2014-11-25 LAB — I-STAT BETA HCG BLOOD, ED (MC, WL, AP ONLY)

## 2014-11-25 LAB — I-STAT TROPONIN, ED: Troponin i, poc: 0 ng/mL (ref 0.00–0.08)

## 2014-11-25 LAB — TROPONIN I: Troponin I: <0.03 ng/mL (ref <0.031)

## 2014-11-25 LAB — COMPREHENSIVE METABOLIC PANEL
ALK PHOS: 74 U/L (ref 38–126)
ALT: 13 U/L — AB (ref 14–54)
AST: 23 U/L (ref 15–41)
Albumin: 3.4 g/dL — ABNORMAL LOW (ref 3.5–5.0)
Anion gap: 8 (ref 5–15)
BILIRUBIN TOTAL: 0.3 mg/dL (ref 0.3–1.2)
BUN: 7 mg/dL (ref 6–20)
CHLORIDE: 104 mmol/L (ref 101–111)
CO2: 25 mmol/L (ref 22–32)
Calcium: 9.2 mg/dL (ref 8.9–10.3)
Creatinine, Ser: 1.02 mg/dL — ABNORMAL HIGH (ref 0.44–1.00)
GFR calc Af Amer: >60 mL/min (ref >60)
GFR calc non Af Amer: >60 mL/min (ref >60)
Glucose, Bld: 107 mg/dL — ABNORMAL HIGH (ref 70–99)
Potassium: 3.2 mmol/L — ABNORMAL LOW (ref 3.5–5.1)
Sodium: 137 mmol/L (ref 135–145)
Total Protein: 6.4 g/dL — ABNORMAL LOW (ref 6.5–8.1)

## 2014-11-25 LAB — CBC WITH DIFFERENTIAL/PLATELET
BASOS ABS: 0 10*3/uL (ref 0.0–0.1)
BASOS PCT: 0 % (ref 0–1)
EOS ABS: 0.1 10*3/uL (ref 0.0–0.7)
Eosinophils Relative: 1 % (ref 0–5)
HEMATOCRIT: 36.7 % (ref 36.0–46.0)
HEMOGLOBIN: 12.2 g/dL (ref 12.0–15.0)
Lymphocytes Relative: 22 % (ref 12–46)
Lymphs Abs: 1.8 10*3/uL (ref 0.7–4.0)
MCH: 28.1 pg (ref 26.0–34.0)
MCHC: 33.2 g/dL (ref 30.0–36.0)
MCV: 84.6 fL (ref 78.0–100.0)
MONO ABS: 0.6 10*3/uL (ref 0.1–1.0)
MONOS PCT: 7 % (ref 3–12)
Neutro Abs: 5.7 10*3/uL (ref 1.7–7.7)
Neutrophils Relative %: 70 % (ref 43–77)
Platelets: 382 10*3/uL (ref 150–400)
RBC: 4.34 MIL/uL (ref 3.87–5.11)
RDW: 15.3 % (ref 11.5–15.5)
WBC: 8 10*3/uL (ref 4.0–10.5)

## 2014-11-25 LAB — I-STAT CG4 LACTIC ACID, ED: Lactic Acid, Venous: 1.1 mmol/L (ref 0.5–2.0)

## 2014-11-25 LAB — MAGNESIUM: MAGNESIUM: 1.8 mg/dL (ref 1.7–2.4)

## 2014-11-25 LAB — D-DIMER, QUANTITATIVE: D-Dimer, Quant: 1.16 ug/mL-FEU — ABNORMAL HIGH (ref 0.00–0.48)

## 2014-11-25 LAB — LIPASE, BLOOD: Lipase: 26 U/L (ref 22–51)

## 2014-11-25 MED ORDER — ONDANSETRON HCL 4 MG/2ML IJ SOLN
4.0000 mg | Freq: Once | INTRAMUSCULAR | Status: AC
  Administered 2014-11-25: 4 mg via INTRAVENOUS
  Filled 2014-11-25: qty 2

## 2014-11-25 MED ORDER — IOHEXOL 350 MG/ML SOLN
100.0000 mL | Freq: Once | INTRAVENOUS | Status: AC | PRN
  Administered 2014-11-25: 100 mL via INTRAVENOUS

## 2014-11-25 MED ORDER — SODIUM CHLORIDE 0.9 % IV BOLUS (SEPSIS): 1000.0000 mL | Freq: Once | INTRAVENOUS | Status: DC

## 2014-11-25 MED ORDER — HYDROCODONE-ACETAMINOPHEN 5-325 MG PO TABS
2.0000 | ORAL_TABLET | Freq: Once | ORAL | Status: AC
  Administered 2014-11-25: 2 via ORAL
  Filled 2014-11-25: qty 2

## 2014-11-25 MED ORDER — MORPHINE SULFATE 4 MG/ML IJ SOLN
4.0000 mg | Freq: Once | INTRAMUSCULAR | Status: AC
  Administered 2014-11-25: 4 mg via INTRAVENOUS
  Filled 2014-11-25: qty 1

## 2014-11-25 MED ORDER — SODIUM CHLORIDE 0.9 % IV BOLUS (SEPSIS)
1000.0000 mL | Freq: Once | INTRAVENOUS | Status: AC
  Administered 2014-11-25: 1000 mL via INTRAVENOUS

## 2014-11-25 MED ORDER — KETOROLAC TROMETHAMINE 30 MG/ML IJ SOLN
30.0000 mg | Freq: Once | INTRAMUSCULAR | Status: AC
  Administered 2014-11-25: 30 mg via INTRAVENOUS
  Filled 2014-11-25: qty 1

## 2014-11-25 NOTE — ED Notes (Signed)
Lab at the bedside 

## 2014-11-25 NOTE — ED Provider Notes (Signed)
Patient signed out to me at change of shift by Dr Claudine Mouton, pending repeat troponin.  If negative, pt may be d/c home, papers printed in anticipation of negative study.    Discussed negative troponin result with patient and plan for discharge home.  She is very pleased with result, no further needs or questions at this time.    [place], PA-C 11/25/14 [id number]  Everlene Balls, MD 11/25/14 1501

## 2014-11-25 NOTE — ED Notes (Signed)
Patient transported to X-ray 

## 2014-11-25 NOTE — ED Notes (Signed)
Pt. arrived with EMS from home woke up this evening with central chest pain " burning/pressure" , mild SOB and emesis , pain increases with deep inspiration.

## 2014-11-25 NOTE — Discharge Instructions (Signed)
Chest Pain (Nonspecific) Ms. Carte, your workup was negative for damage to your heart. Your CT scan did not show any blood clots. Your CT scan showed a lung nodule, you need follow-up in 6 months to make sure that this is not a possible cancer. See a primary care physician within 3 days for continued management. If symptoms worsen come back to emergency department immediately. Thank you.  IMPRESSION: No demonstrable pulmonary embolus. No edema or consolidation. Small upper lobe nodular opacities are most likely benign in this age group. No demonstrable adenopathy.  It is often hard to give a diagnosis for the cause of chest pain. There is always a chance that your pain could be related to something serious, such as a heart attack or a blood clot in the lungs. You need to follow up with your doctor. HOME CARE  If antibiotic medicine was given, take it as directed by your doctor. Finish the medicine even if you start to feel better.  For the next few days, avoid activities that bring on chest pain. Continue physical activities as told by your doctor.  Do not use any tobacco products. This includes cigarettes, chewing tobacco, and e-cigarettes.  Avoid drinking alcohol.  Only take medicine as told by your doctor.  Follow your doctor's suggestions for more testing if your chest pain does not go away.  Keep all doctor visits you made. GET HELP IF:  Your chest pain does not go away, even after treatment.  You have a rash with blisters on your chest.  You have a fever. GET HELP RIGHT AWAY IF:   You have more pain or pain that spreads to your arm, neck, jaw, back, or belly (abdomen).  You have shortness of breath.  You cough more than usual or cough up blood.  You have very bad back or belly pain.  You feel sick to your stomach (nauseous) or throw up (vomit).  You have very bad weakness.  You pass out (faint).  You have chills. This is an emergency. Do not wait to see if the  problems will go away. Call your local emergency services (911 in [place] Do not drive yourself to the hospital. MAKE SURE YOU:   Understand these instructions.  Will watch your condition.  Will get help right away if you are not doing well or get worse. Document Released: 12/22/2007 Document Revised: 07/10/2013 Document Reviewed: 12/22/2007 [facility] Patient Information 2015 [place], [place]. This information is not intended to replace advice given to you by your health care provider. Make sure you discuss any questions you have with your health care provider.

## 2014-11-25 NOTE — ED Provider Notes (Signed)
CSN: [id number]     Arrival date & time 11/25/14  0340 History   First MD Initiated Contact with Patient 11/25/14 0346    This chart was scribed for Everlene Balls, MD by Terressa Koyanagi, ED Scribe. This patient was seen in room A04C/A04C and the patient's care was started at 3:47 AM.  Chief Complaint  Patient presents with  . Chest Pain   The history is provided by the patient and a parent. No language interpreter was used.   HPI Comments: Morgan Newton is a 29 y.o. female, with PMH noted below including PIH, brought in by ambulance and accompanied by her parents, who presents to the Emergency Department complaining of sudden onset, atraumatic, burning/pressure chest pain with associated diaphoresis, SOB and vomiting onset early this morning. Pt reports her chest pain woke her up this morning.  Pt denies diarrhea, pain or swelling of LE, recent travels outside of the state, hormone pills. Per mom, pt experienced intermittent episodes of chest pain as a child and was subsequently Dx with a heart murmur.   Past Medical History  Diagnosis Date  . Asthma   . Anemia   . Heart murmur   . Genital herpes   . Depression   . Hypertension     PIH  . Pregnancy induced hypertension   . Infection     urinary tract infection  . PID (acute pelvic inflammatory disease)   . Abnormal Pap smear 2012    colpo  . Chlamydia   . Gonorrhea   . Trichimoniasis    Past Surgical History  Procedure Laterality Date  . Ganglion cyst excision    . Cesarean section  07/22/2012    Procedure: CESAREAN SECTION;  Surgeon: Frederico Hamman, MD;  Location: [facility];  Service: Obstetrics;  Laterality: N/A;  Primary Cesarean Section    Family History  Problem Relation Age of Onset  . Other Neg Hx   . Hearing loss Neg Hx   . Hypertension Mother   . Thyroid disease Mother   . Hypertension Maternal Grandmother   . Thyroid disease Maternal Grandmother   . Hypertension Maternal Grandfather   . Asthma Father   . Asthma  Maternal Aunt   . Asthma Paternal Grandmother    History  Substance Use Topics  . Smoking status: Current Some Day Smoker -- 0.50 packs/day    Types: Cigarettes  . Smokeless tobacco: Never Used  . Alcohol Use: No   OB History    Gravida Para Term Preterm AB TAB SAB Ectopic Multiple Living   3 3 2 1      2      Review of Systems  10 Systems reviewed and all are negative for acute change except as noted in the HPI.    Allergies  Penicillins and Caffeine  Home Medications   Prior to Admission medications   Medication Sig Start Date End Date Taking? Authorizing Provider  albuterol (PROVENTIL HFA;VENTOLIN HFA) 108 (90 BASE) MCG/ACT inhaler Inhale 1-2 puffs into the lungs every 6 (six) hours as needed for wheezing or shortness of breath. 08/27/14   Lacretia Leigh, MD  ibuprofen (ADVIL,MOTRIN) 200 MG tablet Take 200 mg by mouth every 6 (six) hours as needed. pain    Historical Provider, MD  predniSONE (DELTASONE) 10 MG tablet Take 2 tablets (20 mg total) by mouth daily. 08/27/14   Lacretia Leigh, MD   Triage Vitals: BP 115/73 mmHg  Pulse 64  Temp(Src) 97.6 F (36.4 C)  Resp 13  SpO2 100%  LMP 11/12/2014 Physical Exam  Constitutional: She is oriented to person, place, and time. She appears well-developed and well-nourished. No distress.  Appears drowsy but easily arousable    HENT:  Head: Normocephalic and atraumatic.  Nose: Nose normal.  Mouth/Throat: Oropharynx is clear and moist. No oropharyngeal exudate.  Eyes: Conjunctivae and EOM are normal. Pupils are equal, round, and reactive to light. No scleral icterus.  Neck: Normal range of motion. Neck supple. No JVD present. No tracheal deviation present. No thyromegaly present.  Cardiovascular: Normal rate and normal heart sounds.  An irregular rhythm present. Exam reveals no gallop and no friction rub.   No murmur heard. Pulmonary/Chest: Effort normal and breath sounds normal. No respiratory distress. She has no wheezes. She  exhibits no tenderness.  Abdominal: Soft. Bowel sounds are normal. She exhibits no distension and no mass. There is no tenderness. There is no rebound and no guarding.  Musculoskeletal: Normal range of motion. She exhibits no edema or tenderness.  Lymphadenopathy:    She has no cervical adenopathy.  Neurological: She is alert and oriented to person, place, and time. No cranial nerve deficit. She exhibits normal muscle tone.  Skin: Skin is warm and dry. No rash noted. No erythema. No pallor.  Nursing note and vitals reviewed.   ED Course  Procedures (including critical care time) DIAGNOSTIC STUDIES: Oxygen Saturation is 100% on RA, nl by my interpretation.    COORDINATION OF CARE: 3:50 AM-Discussed treatment plan which includes meds, labs, EKG with pt at bedside and pt agreed to plan.   Labs Review Labs Reviewed  COMPREHENSIVE METABOLIC PANEL - Abnormal; Notable for the following:    Potassium 3.2 (*)    Glucose, Bld 107 (*)    Creatinine, Ser 1.02 (*)    Total Protein 6.4 (*)    Albumin 3.4 (*)    ALT 13 (*)    All other components within normal limits  D-DIMER, QUANTITATIVE - Abnormal; Notable for the following:    D-Dimer, Quant 1.16 (*)    All other components within normal limits  CBC WITH DIFFERENTIAL/PLATELET  LIPASE, BLOOD  MAGNESIUM  TROPONIN I  I-STAT TROPOININ, ED  I-STAT CG4 LACTIC ACID, ED  I-STAT BETA HCG BLOOD, ED ([facility], [facility], [facility] ONLY)    Imaging Review Dg Chest 2 View  11/25/2014   CLINICAL DATA:  Initial evaluation for acute chest pain.  EXAM: CHEST  2 VIEW  COMPARISON:  Prior radiograph from 05/06/2014  FINDINGS: The cardiac and mediastinal silhouettes are stable in size and contour, and remain within normal limits.  The lungs are normally inflated. No airspace consolidation, pleural effusion, or pulmonary edema is identified. There is no pneumothorax.  No acute osseous abnormality identified.  IMPRESSION: No active cardiopulmonary disease.   Electronically  Signed   By: Jeannine Boga M.D.   On: 11/25/2014 04:42   Ct Angio Chest Pe W/cm &/or Wo Cm  11/25/2014   CLINICAL DATA:  Chest pain and shortness of breath for 1 day  EXAM: CT ANGIOGRAPHY CHEST WITH CONTRAST  TECHNIQUE: Multidetector CT imaging of the chest was performed using the standard protocol during bolus administration of intravenous contrast. Multiplanar CT image reconstructions and MIPs were obtained to evaluate the vascular anatomy.  CONTRAST:  118m OMNIPAQUE IOHEXOL 350 MG/ML SOLN  COMPARISON:  Chest CT March 09, 2007; chest radiograph Nov 25, 2014  FINDINGS: There is no demonstrable pulmonary embolus. There is no thoracic aortic aneurysm or dissection.  There is no edema  or consolidation. There is a 4 mm nodular opacity in the anterior segment of the right upper lobe seen on axial slice 25 series 6. There is a 2 mm nodular opacity in the anterior segment of the left upper lobe seen on axial slice 26 series 6. There is a 2 mm nodular opacity in the left upper lobe anterior segment on slice 27 series 6 as well.  There is no appreciable thoracic adenopathy. Visualized thyroid appears normal. Pericardium is not thickened.  Visualized upper abdominal structures appear unremarkable. There are no blastic or lytic bone lesions.  Review of the MIP images confirms the above findings.  IMPRESSION: No demonstrable pulmonary embolus. No edema or consolidation. Small upper lobe nodular opacities are most likely benign in this age group. No demonstrable adenopathy.   Electronically Signed   By: Lowella Grip III M.D.   On: 11/25/2014 07:22     EKG Interpretation   Date/Time:  Monday Nov 25 2014 03:52:47 EDT Ventricular Rate:  63 PR Interval:  136 QRS Duration: 70 QT Interval:  398 QTC Calculation: 407 R Axis:   64 Text Interpretation:  Sinus arrhythmia Borderline T wave abnormalities No  significant change since last tracing Confirmed by Glynn Octave  [phone]) on 11/25/2014 4:02:59  AM      MDM   Final diagnoses:  None   Patient since emergency department for chest pain and shortness of breath. Her pain is pleuritic, and is located on the right side with radiation into her right arm. She denies this ever happening to her in the past. Patient woke up with this pain.  Patient is low risk for ACS, will evaluate with the heart score. First troponin is negative. Second troponin will be drawn at 7 AM along with an EKG.  D-dimer was elevated at 1.16. CT angios has been ordered. Patient will be signed out to oncoming provider, please see note for ultimate disposition of this patient. If CT angios negative and second troponin is also normal, patient will be safe for discharge and primary care follow-up. Patient was given Toradol for pain and she states this improved her symptoms.  CT angio negative.  Patient informed about nodule seen and need for fu within 6 months.  Repeat trop pending.  If negative patient is safe for DC.  Patient signed out to [place], [place] who will follow up results.  PCP fu advised within 3 days.  Her VS remain within her normal limits and she is safe for DC.  I personally performed the services described in this documentation, which was scribed in my presence. The recorded information has been reviewed and is accurate.    Everlene Balls, MD 11/25/14 [phone]

## 2015-07-19 ENCOUNTER — Encounter (HOSPITAL_COMMUNITY): Payer: Self-pay | Admitting: Emergency Medicine

## 2015-07-19 ENCOUNTER — Emergency Department (HOSPITAL_COMMUNITY)
Admission: EM | Admit: 2015-07-19 | Discharge: 2015-07-19 | Disposition: A | Discharge status: Home / Self Care | Payer: Medicaid Other | Attending: Emergency Medicine | Admitting: Emergency Medicine

## 2015-07-19 ENCOUNTER — Emergency Department (HOSPITAL_COMMUNITY)
Admission: EM | Admit: 2015-07-19 | Discharge: 2015-07-19 | Discharge status: Against Medical Advice | Payer: Medicaid Other | Source: Home / Self Care

## 2015-07-19 ENCOUNTER — Emergency Department (HOSPITAL_COMMUNITY): Payer: Medicaid Other

## 2015-07-19 ENCOUNTER — Encounter (HOSPITAL_COMMUNITY): Payer: Self-pay

## 2015-07-19 DIAGNOSIS — Z3202 Encounter for pregnancy test, result negative: Secondary | ICD-10-CM | POA: Insufficient documentation

## 2015-07-19 DIAGNOSIS — R519 Headache, unspecified: Secondary | ICD-10-CM

## 2015-07-19 DIAGNOSIS — Z8619 Personal history of other infectious and parasitic diseases: Secondary | ICD-10-CM | POA: Diagnosis not present

## 2015-07-19 DIAGNOSIS — R011 Cardiac murmur, unspecified: Secondary | ICD-10-CM | POA: Insufficient documentation

## 2015-07-19 DIAGNOSIS — I10 Essential (primary) hypertension: Secondary | ICD-10-CM

## 2015-07-19 DIAGNOSIS — Z87440 Personal history of urinary (tract) infections: Secondary | ICD-10-CM | POA: Diagnosis not present

## 2015-07-19 DIAGNOSIS — J45909 Unspecified asthma, uncomplicated: Secondary | ICD-10-CM | POA: Insufficient documentation

## 2015-07-19 DIAGNOSIS — H6692 Otitis media, unspecified, left ear: Secondary | ICD-10-CM | POA: Diagnosis not present

## 2015-07-19 DIAGNOSIS — Z862 Personal history of diseases of the blood and blood-forming organs and certain disorders involving the immune mechanism: Secondary | ICD-10-CM | POA: Diagnosis not present

## 2015-07-19 DIAGNOSIS — H6121 Impacted cerumen, right ear: Secondary | ICD-10-CM | POA: Diagnosis not present

## 2015-07-19 DIAGNOSIS — R197 Diarrhea, unspecified: Secondary | ICD-10-CM | POA: Insufficient documentation

## 2015-07-19 DIAGNOSIS — R52 Pain, unspecified: Secondary | ICD-10-CM

## 2015-07-19 DIAGNOSIS — Z880 Allergy status to penicillin: Secondary | ICD-10-CM | POA: Diagnosis not present

## 2015-07-19 DIAGNOSIS — F17210 Nicotine dependence, cigarettes, uncomplicated: Secondary | ICD-10-CM

## 2015-07-19 DIAGNOSIS — R51 Headache: Secondary | ICD-10-CM | POA: Insufficient documentation

## 2015-07-19 DIAGNOSIS — Z8742 Personal history of other diseases of the female genital tract: Secondary | ICD-10-CM | POA: Diagnosis not present

## 2015-07-19 DIAGNOSIS — R112 Nausea with vomiting, unspecified: Secondary | ICD-10-CM | POA: Insufficient documentation

## 2015-07-19 LAB — PREGNANCY, URINE: PREG TEST UR: NEGATIVE

## 2015-07-19 MED ORDER — ONDANSETRON 4 MG PO TBDP: 4.0000 mg | ORAL_TABLET | Freq: Three times a day (TID) | ORAL | Status: DC | PRN

## 2015-07-19 MED ORDER — ONDANSETRON HCL 4 MG/2ML IJ SOLN
INTRAMUSCULAR | Status: AC
  Filled 2015-07-19: qty 2

## 2015-07-19 MED ORDER — CEFUROXIME AXETIL 250 MG PO TABS
250.0000 mg | ORAL_TABLET | Freq: Two times a day (BID) | ORAL | Status: DC
  Administered 2015-07-19: 250 mg via ORAL
  Filled 2015-07-19 (×2): qty 1

## 2015-07-19 MED ORDER — PROCHLORPERAZINE EDISYLATE 5 MG/ML IJ SOLN
10.0000 mg | Freq: Four times a day (QID) | INTRAMUSCULAR | Status: DC | PRN
  Administered 2015-07-19: 10 mg via INTRAVENOUS
  Filled 2015-07-19: qty 2

## 2015-07-19 MED ORDER — ONDANSETRON HCL 4 MG/2ML IJ SOLN
4.0000 mg | Freq: Once | INTRAMUSCULAR | Status: AC
  Administered 2015-07-19: 4 mg via INTRAVENOUS
  Filled 2015-07-19: qty 2

## 2015-07-19 MED ORDER — SODIUM CHLORIDE 0.9 % IV BOLUS (SEPSIS)
1000.0000 mL | Freq: Once | INTRAVENOUS | Status: AC
  Administered 2015-07-19: 1000 mL via INTRAVENOUS

## 2015-07-19 MED ORDER — IBUPROFEN 800 MG PO TABS: 800.0000 mg | ORAL_TABLET | Freq: Three times a day (TID) | ORAL | Status: DC

## 2015-07-19 MED ORDER — CEFUROXIME AXETIL 250 MG PO TABS: 250.0000 mg | ORAL_TABLET | Freq: Two times a day (BID) | ORAL | Status: DC

## 2015-07-19 MED ORDER — METHYLPREDNISOLONE SODIUM SUCC 125 MG IJ SOLR
125.0000 mg | Freq: Once | INTRAMUSCULAR | Status: AC
  Administered 2015-07-19: 125 mg via INTRAVENOUS
  Filled 2015-07-19: qty 2

## 2015-07-19 MED ORDER — VALPROATE SODIUM 500 MG/5ML IV SOLN
500.0000 mg | Freq: Once | INTRAVENOUS | Status: AC
  Administered 2015-07-19: 500 mg via INTRAVENOUS
  Filled 2015-07-19: qty 5

## 2015-07-19 MED ORDER — DIPHENHYDRAMINE HCL 50 MG/ML IJ SOLN
25.0000 mg | Freq: Once | INTRAMUSCULAR | Status: AC
  Administered 2015-07-19: 25 mg via INTRAVENOUS
  Filled 2015-07-19: qty 1

## 2015-07-19 MED ORDER — AZITHROMYCIN 250 MG PO TABS
500.0000 mg | ORAL_TABLET | Freq: Once | ORAL | Status: DC
  Filled 2015-07-19: qty 2

## 2015-07-19 MED ORDER — KETOROLAC TROMETHAMINE 30 MG/ML IJ SOLN
30.0000 mg | Freq: Once | INTRAMUSCULAR | Status: AC
  Administered 2015-07-19: 30 mg via INTRAVENOUS
  Filled 2015-07-19: qty 1

## 2015-07-19 NOTE — ED Provider Notes (Signed)
The patient is a 29 year old female who has been sick with one episode of vomiting and one episode of diarrhea with otherwise feeling poorly, on exam has clear heart and lung sounds, dry mucous membranes, has a headache but has a normal neurologic exam on my exam. She will be given IV fluids, symptomatically medications, she also has signs of otitis media and will be treated with an antibiotic for the same.  Medical screening examination/treatment/procedure(s) were conducted as a shared visit with non-physician practitioner(s) and myself.  I personally evaluated the patient during the encounter.  Clinical Impression:   Final diagnoses:  Acute nonintractable headache, unspecified headache type  Body aches  Non-intractable vomiting with nausea, vomiting of unspecified type  Diarrhea, unspecified type  Acute left otitis media, recurrence not specified, unspecified otitis media type         Noemi Chapel, MD 07/19/15 2200

## 2015-07-19 NOTE — ED Notes (Signed)
Patient and mother informed that a head CT was ordered and we would be doing that.  Patient's mother asked when.  Mother informed that it would be soon, but we didn't have a specific time.  Mother asked how much longer patient would be in department.  Mother informed it could be another 1-2 hours depending on how long it takes to get results.  Patient's mother responded, "more for me to document."

## 2015-07-19 NOTE — ED Provider Notes (Signed)
CSN: [id number]     Arrival date & time 07/19/15  1414 History   First MD Initiated Contact with Patient 07/19/15 1459     Chief Complaint  Patient presents with  . Migraine     (Consider location/radiation/quality/duration/timing/severity/associated sxs/prior Treatment) HPI   Morgan Newton is a 29 y.o. female, with a history of heart murmur, hypertension, anemia, and depression, presenting to the ED with body aches, headache, N/V/D, subjective fever, and chills since yesterday. Rates her headache at 10/10, throbbing, located in the back of her neck into the back of her head. Endorses photophobia. Has taken Theraflu and ibuprofen with no relief. Patient then states that she has headaches frequently and her headache today feels like her previous headaches. Patient denies abdominal pain, dizziness, neuro deficits, vomiting or diarrhea today, chest pain, shortness of breath, or any other pain or complaints.    Past Medical History  Diagnosis Date  . Asthma   . Anemia   . Heart murmur   . Genital herpes   . Depression   . Hypertension     PIH  . Pregnancy induced hypertension   . Infection     urinary tract infection  . PID (acute pelvic inflammatory disease)   . Abnormal Pap smear 2012    colpo  . Chlamydia   . Gonorrhea   . Trichimoniasis    Past Surgical History  Procedure Laterality Date  . Ganglion cyst excision    . Cesarean section  07/22/2012    Procedure: CESAREAN SECTION;  Surgeon: Frederico Hamman, MD;  Location: [facility];  Service: Obstetrics;  Laterality: N/A;  Primary Cesarean Section    Family History  Problem Relation Age of Onset  . Other Neg Hx   . Hearing loss Neg Hx   . Hypertension Mother   . Thyroid disease Mother   . Hypertension Maternal Grandmother   . Thyroid disease Maternal Grandmother   . Hypertension Maternal Grandfather   . Asthma Father   . Asthma Maternal Aunt   . Asthma Paternal Grandmother    Social History  Substance Use Topics   . Smoking status: Current Some Day Smoker -- 0.50 packs/day    Types: Cigarettes  . Smokeless tobacco: Never Used  . Alcohol Use: No   OB History    Gravida Para Term Preterm AB TAB SAB Ectopic Multiple Living   3 3 2 1      2      Review of Systems  Constitutional: Positive for fever and chills. Negative for diaphoresis.  Respiratory: Negative for cough and shortness of breath.   Cardiovascular: Negative for chest pain.  Gastrointestinal: Positive for nausea, vomiting and diarrhea. Negative for abdominal pain, constipation and blood in stool.  Genitourinary: Negative for dysuria, difficulty urinating and pelvic pain.  Musculoskeletal: Positive for myalgias. Negative for neck pain and neck stiffness.  Neurological: Positive for headaches. Negative for dizziness, syncope, weakness and light-headedness.  All other systems reviewed and are negative.     Allergies  Penicillins and Caffeine  Home Medications   Prior to Admission medications   Medication Sig Start Date End Date Taking? Authorizing Provider  ibuprofen (ADVIL,MOTRIN) 200 MG tablet Take 600 mg by mouth every 6 (six) hours as needed. pain   Yes Historical Provider, MD  cefUROXime (CEFTIN) 250 MG tablet Take 1 tablet (250 mg total) by mouth 2 (two) times daily with a meal. 07/19/15   Melana Hingle C Terrika Zuver, PA-C  ibuprofen (ADVIL,MOTRIN) 800 MG tablet Take 1 tablet (  800 mg total) by mouth 3 (three) times daily. 07/19/15   Christina Waldrop C Santosha Jividen, PA-C  ondansetron (ZOFRAN ODT) 4 MG disintegrating tablet Take 1 tablet (4 mg total) by mouth every 8 (eight) hours as needed for nausea or vomiting. 07/19/15   Seema Blum C Chantelle Verdi, PA-C   BP 133/88 mmHg  Pulse 88  Temp(Src) 99 F (37.2 C) (Oral)  Resp 20  SpO2 100%  LMP 07/06/2015 Physical Exam  Constitutional: She is oriented to person, place, and time. She appears well-developed and well-nourished. No distress.  HENT:  Head: Normocephalic and atraumatic.  Right Ear: External ear normal.  Left Ear:  Tympanic membrane is bulging.  Questionable otitis media signs in left ear. Cerumen blocks the full view of the right TM.  Eyes: Conjunctivae and EOM are normal. Pupils are equal, round, and reactive to light.  Neck: Normal range of motion. Neck supple.  Cardiovascular: Normal rate, regular rhythm and normal heart sounds.   Pulmonary/Chest: Effort normal and breath sounds normal. No respiratory distress.  Abdominal: Soft. Bowel sounds are normal.  Musculoskeletal: She exhibits no edema or tenderness.  Full ROM in all extremities and spine. No paraspinal tenderness.   Lymphadenopathy:    She has no cervical adenopathy.  Neurological: She is alert and oriented to person, place, and time. She has normal reflexes.  No sensory deficits. Strength 5/5 in all extremities. No gait disturbance. Coordination intact. Cranial nerves III-XII grossly intact. No facial droop.   Skin: Skin is warm and dry. She is not diaphoretic.  Nursing note and vitals reviewed.   ED Course  Procedures (including critical care time) Labs Review Labs Reviewed  PREGNANCY, URINE    Imaging Review Ct Head Wo Contrast  07/19/2015  CLINICAL DATA:  29 year old female with history of migraine headache for the past 2 days with light and sound sensitivity. EXAM: CT HEAD WITHOUT CONTRAST TECHNIQUE: Contiguous axial images were obtained from the base of the skull through the vertex without intravenous contrast. COMPARISON:  No priors. FINDINGS: No acute intracranial abnormalities. Specifically, no evidence of acute intracranial hemorrhage, no definite findings of acute/subacute cerebral ischemia, no mass, mass effect, hydrocephalus or abnormal intra or extra-axial fluid collections. Visualized paranasal sinuses and mastoids are well pneumatized, with exception of some low to intermediate attenuation polypoid lesions in the maxillary sinuses bilaterally which may represent a mucosal retention cysts or polyps. No acute displaced skull  fractures are identified. IMPRESSION: 1. No acute intracranial abnormalities. 2. The appearance of the brain is normal. 3. Large mucosal retention cysts or polyps in the maxillary sinuses bilaterally. Electronically Signed   By: Vinnie Langton M.D.   On: 07/19/2015 18:25   I have personally reviewed and evaluated these images and lab results as part of my medical decision-making.   EKG Interpretation None      MDM   Final diagnoses:  Acute nonintractable headache, unspecified headache type  Body aches  Non-intractable vomiting with nausea, vomiting of unspecified type  Diarrhea, unspecified type  Acute left otitis media, recurrence not specified, unspecified otitis media type    Morgan Newton presents with headache, subjective fever, chills, body aches, nausea, vomiting, diarrhea for the past 24 hours.  Findings and plan of care discussed with Noemi Chapel, MD.  This patient's presentation is consistent with a viral illness such as influenza, possibly combined with a migraine. She is nontoxic appearing, currently afebrile, is not tachycardic, has a normal neuro exam, maintaining his SPO2 of 100% on room air, and is in  no apparent distress. The patient was given cefuroxime for her otitis media due to her allergy to penicillin. Patient had no reaction to this medication. There was an incident where the patient's mother is at the bedside began yelling and screaming that nobody was doing anything for her daughter. Patient's mother was informed that we were indeed treating the patient, but it may take some time for the medications to have full effect. It was further stated that her yelling would cause the patient to have more pain due to her being sound-sensitive. Upon reassessment, patient states that her pain has dropped to an 8 out of 10. 6:22 PM patient states that her pain is now gone.  CT shows no acute abnormalities. This information was communicated with the patient, who states that  she is still pain-free. Patient will be referred to neurology for her headaches. The patient was given instructions for home care as well as return precautions. Patient voices understanding of these instructions, accepts the plan, and is comfortable with discharge.  Lorayne Bender, PA-C 07/19/15 1909  Noemi Chapel, MD 07/19/15 2200

## 2015-07-19 NOTE — ED Notes (Signed)
Patient c/o headache since yesterday with bilateral ear pain/pressure for several days and one episode of N/V yesterday.  Patient endorses photophobia and phonophobia.  Patient denies dizziness and SOB.  Patient is ambulatory without assistance, steady gait and no unilateral or generalized weakness.  PERRL 45m.

## 2015-07-19 NOTE — ED Notes (Signed)
Pt complaint of migraine for two days with symptoms of emesis and light/sound sensitivity.

## 2015-07-19 NOTE — ED Notes (Addendum)
Patient's mother came out to nurses station screaming that her daughter wasn't feeling better and we were not helping her.  Mother redirected to room with RN and charge RN explained that we could not have yelling and screaming in department.  Mother screamed at RN, "Stop talking to me, stop talking to me.  Take her IV out, we're leaving."  RN explained to patient's mother that we had treated patient's pain and more pain medications were ordered, but the IV had to remain in place if patient wanted to receive them.  Mother was informed that she would be asked to leave if she continued screaming at staff and she wasn't helping her daughter to feel better screaming and RN couldn't leave to get more medications if patient's mother continued to scream.  EDPs Sabra Heck and Joy reassessed patient.  Patient's mother eventually calmed down and additional meds were given. Patient's mother asked for RN, charge nurse and providers names to write down.

## 2015-07-19 NOTE — ED Notes (Signed)
Patient ambulated to bathroom without assistance.

## 2015-07-19 NOTE — ED Notes (Signed)
Pt reports onset 2 days headache, unrelieved with Tylenol and Ibuprofen, blurred vision.  Pt had body aches, chills and fever yesterday took Theraflu and those symptoms resolved.  No swallowing or resp difficulties.

## 2015-07-19 NOTE — Discharge Instructions (Signed)
You have been seen today for flulike symptoms. Follow up with PCP as needed. Return to ED should symptoms worsen. Drink plenty of fluids and get plenty of rest. Ibuprofen or Tylenol for pain or fever. Zofran for nausea. Follow-up with neurology for the headaches. Please take all of your antibiotics until finished!   You may develop abdominal discomfort or diarrhea from the antibiotic.  You may help offset this with probiotics which you can buy or get in yogurt. Do not eat or take the probiotics until 2 hours after your antibiotic.    Emergency Department Resource Guide 1) Find a Doctor and Pay Out of Pocket Although you won't have to find out who is covered by your insurance plan, it is a good idea to ask around and get recommendations. You will then need to call the office and see if the doctor you have chosen will accept you as a new patient and what types of options they offer for patients who are self-pay. Some doctors offer discounts or will set up payment plans for their patients who do not have insurance, but you will need to ask so you aren't surprised when you get to your appointment.  2) Contact Your Local Health Department Not all health departments have doctors that can see patients for sick visits, but many do, so it is worth a call to see if yours does. If you don't know where your local health department is, you can check in your phone book. The CDC also has a tool to help you locate your state's health department, and many state websites also have listings of all of their local health departments.  3) Find a [facility] If your illness is not likely to be very severe or complicated, you may want to try a walk in clinic. These are popping up all over the country in pharmacies, drugstores, and shopping centers. They're usually staffed by nurse practitioners or physician assistants that have been trained to treat common illnesses and complaints. They're usually fairly quick and  inexpensive. However, if you have serious medical issues or chronic medical problems, these are probably not your best option.  No Primary Care Doctor: - Call Health Connect at  [phone] - they can help you locate a primary care doctor that  accepts your insurance, provides certain services, etc. - Physician Referral Service- [phone]  Chronic Pain Problems: Organization         Address  Phone   Notes  [facility]  [phone] Patients need to be referred by their primary care doctor.   Medication Assistance: Organization         Address  Phone   Notes  [facility] Medication [facility] [place] [place], [place] [address] [phone] --Must be a resident of [facility] -- Must have NO insurance coverage whatsoever (no Medicaid/ Medicare, etc.) -- The pt. MUST have a primary care doctor that directs their care regularly and follows them in the community   MedAssist  [phone]   [employer]  [phone]    Agencies that provide inexpensive medical care: Organization         Address  Phone   Notes  [place]  [phone]   [facility]    [phone]   [facility] [place], [place] [address] [phone]   [place] [address] [place] [phone]  Planned Parenthood    [phone]   [facility]    [phone]   Community [facility] and [address], [place] Phone:  [phone], Fax:  [phone] Hours of Operation:  9 am - 6 pm, M-F.  Also accepts Medicaid/Medicare and self-pay.  [facility] for [place], [address], [place] Phone: [phone], Fax: [phone]. Hours of Operation:  8:30 am - 5:30 pm, M-F.  Also accepts Medicaid and self-pay.  [facility] [place] [address] [place] Phone: [phone]   [place], [place], [place] [phone], Ext. [phone] Mondays & Thursdays: 7-9 AM.  First 15 patients are seen on a first come, first serve basis.    [place] Providers:  Organization         Address  Phone   Notes  [facility] [address], [place] [phone] Also accepts self-pay patients.  [facility] [address], [place]  [phone]   [place], [address], [place] [phone]   [facility] [address] [place] [phone]   Lucianne Lei [address], [place]   [phone] Only accepts [place] Access [place] patients after they have their name applied to their card.   Self-Pay (no insurance) in [facility]:  Organization         Address  Phone   Notes  Sickle Cell Patients, [facility] Internal Medicine [place] [phone]   [facility] [place] [phone]   [facility] [place]  [place], [place], [place] [phone]   [facility]/Dr. Osei-Bonsu  [address] [place] or [address], [place] [phone] Phone number for both [place] and [place] locations is the same.  [facility] [place] [address] [place] [phone]   [facility] [address] [place] or [address] [phone]   [facility] [address], [place] [phone], phone; [phone], fax Sees patients 1st and 3rd Saturday of every month.  Must not qualify for public or private insurance (i.e. Medicaid, Medicare, [place] Health Choice, Veterans' Benefits)  Household income should be no more than 200% of the poverty level The clinic cannot treat you if you are pregnant or  think you are pregnant  Sexually transmitted diseases are not treated at the clinic.    Dental Care: Organization         Address  Phone  Notes  [facility] of [facility] [place] [phone] Accepts children up to age 21 who are enrolled in [place] or [place]; pregnant women with a Medicaid card; and children who have applied for Medicaid or [place] Health Choice, but were declined, whose parents can pay a reduced fee at time of service.  [facility] Department of [facility]  [address], [place] [phone] Accepts children up to age 38 who are enrolled in [place] or [place]; pregnant women with a Medicaid card; and children who have applied for Medicaid or [place], but were declined,  whose parents can pay a reduced fee at time of service.  [place] Adult [facility] Access PROGRAM  [place] [phone] Patients are seen by appointment only. Walk-ins are not accepted. [place] will see patients 41 years of age and older. Monday - Tuesday (8am-5pm) Most Wednesdays (8:30-5pm) $30 per visit, cash only  [facility] Adult [facility] Access PROGRAM  [address], [facility] [phone] Patients are seen by appointment only. Walk-ins are not accepted. [place] will see patients 36 years of age and older. One Wednesday Evening (Monthly: Volunteer Based).  $30 per visit, cash only  [place]  [phone] for adults; Children under age 67, call Graduate Pediatric Dentistry at [phone]. Children aged 19-14, please call [phone] to request a pediatric application.  Dental services are provided in all areas of dental care including fillings, crowns and bridges, complete and partial dentures, implants, gum treatment, root canals, and extractions. Preventive care is also provided. Treatment is provided to both adults  and children. Patients are selected via a lottery and there is often a waiting list.   [facility] [address], [place]  [phone] [email or web address]   [facility] [address] [place], [place] [phone], Ext. [phone] Second and Fourth Thursday of each month, opens at 6:30 AM; Clinic ends at 9 AM.  Patients are seen on a first-come first-served basis, and a limited number are seen during each clinic.   [facility]  [address] Hillard Danker [place], [place] [phone]   Eligibility Requirements You must have lived in [place], [place], or [place] counties for at least the last three months.   You cannot be eligible for state or federal sponsored [employer], including [employer], [place], or [employer].   You generally cannot be eligible for healthcare insurance through your employer.    How to apply: Eligibility screenings are held every Tuesday and Wednesday afternoon from 1:00 pm until 4:00 pm. You do not need an appointment for the interview!  [facility] [address], [place], [place]   [place]  [place]  [place]  [phone]    Behavioral Health Resources in the Community: Intensive Outpatient Programs Organization         Address  Phone  Notes  [place]. [address], [place], [place] [phone]   [facility] Outpatient [address], [place], [place]   ADS: Alcohol [facility] Drug [facility] [address], [place], [place]   [place] [address]  [place], [place] or [phone]   Substance Abuse Resources Organization         Address  Phone  Notes  Al[facility] Drug Services  [phone]   [place]  [phone]   [facility]     Chinita Pester  [phone]   Residential & Outpatient Substance Abuse Program  [phone]   Psychological Services Organization         Address  Phone  Notes  [facility] [place]  [place]  [phone]   [place] [address] [place] or [phone]    Mobile Crisis Teams Organization         Address  Phone  Notes  [facility],  Mobile Crisis Care Unit  [phone]   [facility] Psychotherapeutic Services  [address] [place], [place]   [facility] [address], [place] Advance [phone]    Self-Help/Support Groups Organization         Address  Phone             Notes  [place]. of [place] - variety of support groups  [place] Call for more information  Narcotics Anonymous (NA), [facility] [address], [employer] [place]  2 meetings at this location   Special educational needs teacher         Address  Phone  Notes  [facility] [place],    [place]  [phone]   [facility]  [address] [place] [id number], [place], [place]   [place], [place] [phone] Admissions: 8am-3pm M-F  Incentives Substance [facility] [address],    [place], [place] [phone]   [facility] [address] [place], [place], [place]   [facility] [address],  [place], [place]   [facility] - Intensive Outpatient [address], [place], [place]   [facility] [place].,  [place], [place] [phone] or [phone]   [facility] (RTS) [address], [place], [place] Accepts Medicaid  [facility] [place] [address]  [place] [phone] Substance Abuse/Addiction Treatment   [facility] Organization         Address  Phone  Notes  [facility]  [phone]   Domenic Schwab, PhD [address] [place], [place]   [phone] or [phone]   [place], [place] [phone]   [facility] [address], [place], [place] [phone] Insurance/Medicaid/sponsorship through [facility] and Families [address] [place]                                    [place], [place] [phone] [place] [address], [place] [phone]    Dr. Adele Schilder  [phone]   [facility] Dept. 1) [address] [place] 2) [place] 3)  [place] [address], [place] [phone]  [phone]   [place] [phone] or [phone] (After Hours)

## 2015-07-19 NOTE — ED Notes (Signed)
Patient left AMA to go to [facility]

## 2018-08-25 ENCOUNTER — Encounter (HOSPITAL_COMMUNITY): Payer: Self-pay | Admitting: *Deleted

## 2018-08-25 ENCOUNTER — Inpatient Hospital Stay (HOSPITAL_COMMUNITY): Payer: Medicaid Other

## 2018-08-25 ENCOUNTER — Other Ambulatory Visit: Payer: Self-pay

## 2018-08-25 ENCOUNTER — Inpatient Hospital Stay (HOSPITAL_COMMUNITY)
Admission: AD | Admit: 2018-08-25 | Discharge: 2018-08-25 | Disposition: A | Discharge status: Home / Self Care | Payer: Medicaid Other | Attending: Obstetrics & Gynecology | Admitting: Obstetrics & Gynecology

## 2018-08-25 DIAGNOSIS — Z3A01 Less than 8 weeks gestation of pregnancy: Secondary | ICD-10-CM | POA: Diagnosis not present

## 2018-08-25 DIAGNOSIS — O4690 Antepartum hemorrhage, unspecified, unspecified trimester: Secondary | ICD-10-CM

## 2018-08-25 DIAGNOSIS — R109 Unspecified abdominal pain: Secondary | ICD-10-CM | POA: Diagnosis present

## 2018-08-25 DIAGNOSIS — O4691 Antepartum hemorrhage, unspecified, first trimester: Secondary | ICD-10-CM | POA: Diagnosis not present

## 2018-08-25 DIAGNOSIS — O209 Hemorrhage in early pregnancy, unspecified: Secondary | ICD-10-CM | POA: Insufficient documentation

## 2018-08-25 DIAGNOSIS — O3680X0 Pregnancy with inconclusive fetal viability, not applicable or unspecified: Secondary | ICD-10-CM | POA: Diagnosis not present

## 2018-08-25 DIAGNOSIS — Z3A00 Weeks of gestation of pregnancy not specified: Secondary | ICD-10-CM | POA: Diagnosis not present

## 2018-08-25 DIAGNOSIS — O99331 Smoking (tobacco) complicating pregnancy, first trimester: Secondary | ICD-10-CM | POA: Diagnosis not present

## 2018-08-25 DIAGNOSIS — Z880 Allergy status to penicillin: Secondary | ICD-10-CM | POA: Insufficient documentation

## 2018-08-25 DIAGNOSIS — F17210 Nicotine dependence, cigarettes, uncomplicated: Secondary | ICD-10-CM | POA: Insufficient documentation

## 2018-08-25 LAB — URINALYSIS, ROUTINE W REFLEX MICROSCOPIC
Bilirubin Urine: NEGATIVE
Glucose, UA: NEGATIVE mg/dL
Ketones, ur: NEGATIVE mg/dL
Leukocytes, UA: NEGATIVE
Nitrite: NEGATIVE
Protein, ur: NEGATIVE mg/dL
Specific Gravity, Urine: 1.015 (ref 1.005–1.030)
pH: 5.5 (ref 5.0–8.0)

## 2018-08-25 LAB — URINALYSIS, MICROSCOPIC (REFLEX): Bacteria, UA: NONE SEEN

## 2018-08-25 LAB — CBC
HCT: 39.8 % (ref 36.0–46.0)
Hemoglobin: 12.5 g/dL (ref 12.0–15.0)
MCH: 27.8 pg (ref 26.0–34.0)
MCHC: 31.4 g/dL (ref 30.0–36.0)
MCV: 88.6 fL (ref 80.0–100.0)
Platelets: 397 10*3/uL (ref 150–400)
RBC: 4.49 MIL/uL (ref 3.87–5.11)
RDW: 16.3 % — ABNORMAL HIGH (ref 11.5–15.5)
WBC: 7.6 10*3/uL (ref 4.0–10.5)
nRBC: 0 % (ref 0.0–0.2)

## 2018-08-25 LAB — WET PREP, GENITAL
Sperm: NONE SEEN
WBC, Wet Prep HPF POC: NONE SEEN
Yeast Wet Prep HPF POC: NONE SEEN

## 2018-08-25 LAB — POCT PREGNANCY, URINE: PREG TEST UR: POSITIVE — AB

## 2018-08-25 LAB — HCG, QUANTITATIVE, PREGNANCY: hCG, Beta Chain, Quant, S: 185 m[IU]/mL — ABNORMAL HIGH (ref <5)

## 2018-08-25 MED ORDER — ACETAMINOPHEN 500 MG PO TABS
1000.0000 mg | ORAL_TABLET | Freq: Once | ORAL | Status: AC
  Administered 2018-08-25: 1000 mg via ORAL
  Filled 2018-08-25: qty 2

## 2018-08-25 MED ORDER — METRONIDAZOLE 500 MG PO TABS
2000.0000 mg | ORAL_TABLET | Freq: Once | ORAL | Status: AC
  Administered 2018-08-25: 2000 mg via ORAL
  Filled 2018-08-25: qty 4

## 2018-08-25 NOTE — MAU Note (Signed)
+  HPT last wk.  Period was late.  Started spotting on Wed, yesterday became heavier, passed several small clots, one large- palm sized clots. Cramping.

## 2018-08-25 NOTE — Discharge Instructions (Signed)
Ectopic Pregnancy  An ectopic pregnancy is when the fertilized egg attaches (implants) outside the uterus. Most ectopic pregnancies occur in one of the tubes where eggs travel from the ovary to the uterus (fallopian tubes), but the implanting can occur in other locations. In rare cases, ectopic pregnancies occur on the ovary, intestine, pelvis, abdomen, or cervix. In an ectopic pregnancy, the fertilized egg does not have the ability to develop into a normal, healthy baby. A ruptured ectopic pregnancy is one in which tearing or bursting of a fallopian tube causes internal bleeding. Often, there is intense lower abdominal pain, and vaginal bleeding sometimes occurs. Having an ectopic pregnancy can be life-threatening. If this dangerous condition is not treated, it can lead to blood loss, shock, or even death. What are the causes? The most common cause of this condition is damage to one of the fallopian tubes. A fallopian tube may be narrowed or blocked, and that keeps the fertilized egg from reaching the uterus. What increases the risk? This condition is more likely to develop in women of childbearing age who have different levels of risk. The levels of risk can be divided into three categories. High risk  You have gone through infertility treatment.  You have had an ectopic pregnancy before.  You have had surgery on the fallopian tubes, or another surgical procedure, such as an abortion.  You have had surgery to have the fallopian tubes tied (tubal ligation).  You have problems or diseases of the fallopian tubes.  You have been exposed to diethylstilbestrol (DES). This medicine was used until 1971, and it had effects on babies whose mothers took the medicine.  You become pregnant while using an IUD (intrauterine device) for birth control. Moderate risk  You have a history of infertility.  You have had an STI (sexually transmitted infection).  You have a history of pelvic inflammatory  disease (PID).  You have scarring from endometriosis.  You have multiple sexual partners.  You smoke. Low risk  You have had pelvic surgery.  You use vaginal douches.  You became sexually active before age 31. What are the signs or symptoms? Common symptoms of this condition include normal pregnancy symptoms, such as missing a period, nausea, tiredness, abdominal pain, breast tenderness, and bleeding. However, ectopic pregnancy will have additional symptoms, such as:  Pain with intercourse.  Irregular vaginal bleeding or spotting.  Cramping or pain on one side or in the lower abdomen.  Fast heartbeat, low blood pressure, and sweating.  Passing out while having a bowel movement. Symptoms of a ruptured ectopic pregnancy and internal bleeding may include:  Sudden, severe pain in the abdomen and pelvis.  Dizziness, weakness, light-headedness, or fainting.  Pain in the shoulder or neck area. How is this diagnosed? This condition is diagnosed by:  A pelvic exam to locate pain or a mass in the abdomen.  A pregnancy test. This blood test checks for the presence as well as the specific level of pregnancy hormone in the bloodstream.  Ultrasound. This is performed if a pregnancy test is positive. In this test, a probe is inserted into the vagina. The probe will detect a fetus, possibly in a location other than the uterus.  Taking a sample of uterus tissue (dilation and curettage, or D&C).  Surgery to perform a visual exam of the inside of the abdomen using a thin, lighted tube that has a tiny camera on the end (laparoscope).  Culdocentesis. This procedure involves inserting a needle at the top of  the vagina, behind the uterus. If blood is present in this area, it may indicate that a fallopian tube is torn. How is this treated? This condition is treated with medicine or surgery. Medicine  An injection of a medicine (methotrexate) may be given to cause the pregnancy tissue to be  absorbed. This medicine may save your fallopian tube. It may be given if: ? The diagnosis is made early, with no signs of active bleeding. ? The fallopian tube has not ruptured. ? You are considered to be a good candidate for the medicine. Usually, pregnancy hormone blood levels are checked after methotrexate treatment. This is to be sure that the medicine is effective. It may take 4-6 weeks for the pregnancy to be absorbed. Most pregnancies will be absorbed by 3 weeks. Surgery  A laparoscope may be used to remove the pregnancy tissue.  If severe internal bleeding occurs, a larger cut (incision) may be made in the lower abdomen (laparotomy) to remove the fetus and placenta. This is done to stop the bleeding.  Part or all of the fallopian tube may be removed (salpingectomy) along with the fetus and placenta. The fallopian tube may also be repaired during the surgery.  In very rare circumstances, removal of the uterus (hysterectomy) may be required.  After surgery, pregnancy hormone testing may be done to be sure that there is no pregnancy tissue left. Whether your treatment is medicine or surgery, you may receive a Rho (D) immune globulin shot to prevent problems with any future pregnancy. This shot may be given if:  You are Rh-negative and the baby's father is Rh-positive.  You are Rh-negative and you do not know the Rh type of the baby's father. Follow these instructions at home:  Rest and limit your activity after the procedure for as long as told by your health care provider.  Until your health care provider says that it is safe: ? Do not lift anything that is heavier than 10 lb (4.5 kg), or the limit that your health care provider tells you. ? Avoid physical exercise and any movement that requires effort (is strenuous).  To help prevent constipation: ? Eat a healthy diet that includes fruits, vegetables, and whole grains. ? Drink 6-8 glasses of water per day. Get help right away  if:  You develop worsening pain that is not relieved by medicine.  You have: ? A fever or chills. ? Vaginal bleeding. ? Redness and swelling at the incision site. ? Nausea and vomiting.  You feel dizzy or weak.  You feel light-headed or you faint. This information is not intended to replace advice given to you by your health care provider. Make sure you discuss any questions you have with your health care provider. Document Released: 08/12/2004 Document Revised: 03/03/2016 Document Reviewed: 02/04/2016 Elsevier Interactive Patient Education  [employer].

## 2018-08-25 NOTE — MAU Provider Note (Addendum)
History     CSN: [id number]  Arrival date and time: 08/25/18 1744   First Provider Initiated Contact with Patient 08/25/18 1816      Chief Complaint  Patient presents with  . Abdominal Pain  . Back Pain  . Vaginal Bleeding  . Possible Pregnancy   Morgan Newton is a 33 y.o. G4P2 at 58w4dby LMP who presents to MAU with complaints of abdominal pain and vaginal bleeding. She reports having a +HPT on 08/16/2018. She reports vaginal bleeding and abdominal pain started this past Wednesday. Describes abdominal pain as lower abdominal cramping that radiates to her left side, rates pain 4/10- has not taken any medication for abdominal pain. She describes vaginal bleeding as dark red bleeding that got bright red yesterday where she passed a palm sized clot. She has continued to pass small clots today where she is having wear a pad. She denies IC.   OB History    Gravida  4   Para  3   Term  2   Preterm  1   AB      Living  2     SAB      TAB      Ectopic      Multiple      Live Births  3           Past Medical History:  Diagnosis Date  . Abnormal Pap smear 2012   colpo  . Anemia   . Asthma   . Chlamydia   . Depression   . Genital herpes   . Gonorrhea   . Heart murmur   . Hypertension    PIH  . Infection    urinary tract infection  . PID (acute pelvic inflammatory disease)   . Pregnancy induced hypertension   . Trichimoniasis     Past Surgical History:  Procedure Laterality Date  . CESAREAN SECTION  07/22/2012   Procedure: CESAREAN SECTION;  Surgeon: BFrederico Hamman MD;  [facility];  Service: Obstetrics;  Laterality: N/A;  Primary Cesarean Section   . GANGLION CYST EXCISION    . MOUTH SURGERY      Family History  Problem Relation Age of Onset  . Hypertension Maternal Grandfather   . Hypertension Mother   . Thyroid disease Mother   . Hypertension Maternal Grandmother   . Thyroid disease Maternal Grandmother   . Asthma Father   . Asthma  Maternal Aunt   . Asthma Paternal Grandmother   . Other Neg Hx   . Hearing loss Neg Hx     Social History   Tobacco Use  . Smoking status: Current Some Day Smoker    Packs/day: 0.50    Types: Cigarettes  . Smokeless tobacco: Never Used  Substance Use Topics  . Alcohol use: Yes    Comment: rarely  . Drug use: No    Allergies:  Allergies  Allergen Reactions  . Penicillins Shortness Of Breath and Palpitations    ..Marland Kitchenas patient had a PCN reaction causing immediate rash, facial/tongue/throat swelling, SOB or lightheadedness with hypotension: NO Has patient had a PCN reaction causing severe rash involving mucus membranes or skin necrosis: NO Has patient had a PCN reaction that required hospitalization No Has patient had a PCN reaction occurring within the last 10 years: No If all of the above answers are "NO", then may proceed with Cephalosporin use.   . Caffeine Other (See Comments)    Heart murmur    Medications Prior  to Admission  Medication Sig Dispense Refill Last Dose  . cefUROXime (CEFTIN) 250 MG tablet Take 1 tablet (250 mg total) by mouth 2 (two) times daily with a meal. 19 tablet 0   . ibuprofen (ADVIL,MOTRIN) 200 MG tablet Take 600 mg by mouth every 6 (six) hours as needed. pain   07/19/2015 at Unknown time  . ibuprofen (ADVIL,MOTRIN) 800 MG tablet Take 1 tablet (800 mg total) by mouth 3 (three) times daily. 21 tablet 0   . ondansetron (ZOFRAN ODT) 4 MG disintegrating tablet Take 1 tablet (4 mg total) by mouth every 8 (eight) hours as needed for nausea or vomiting. 20 tablet 0     Review of Systems  Constitutional: Negative.   Respiratory: Negative.   Cardiovascular: Negative.   Gastrointestinal: Positive for abdominal pain. Negative for constipation, diarrhea, nausea and vomiting.  Genitourinary: Positive for vaginal bleeding. Negative for difficulty urinating, dysuria, frequency, hematuria, pelvic pain and urgency.  Musculoskeletal: Positive for back pain.    Physical Exam   Blood pressure 125/63, pulse 67, temperature 98.2 F (36.8 C), temperature source Oral, resp. rate 18, height 4' 11"  (1.499 m), weight 84 kg, last menstrual period 07/10/2018, SpO2 100 %.  Physical Exam  Nursing note and vitals reviewed. Constitutional: She appears well-developed and well-nourished. No distress.  Cardiovascular: Regular rhythm and normal heart sounds.  Respiratory: Effort normal and breath sounds normal. No respiratory distress. She has no wheezes. She has no rales.  GI: Soft. Bowel sounds are normal. She exhibits no distension. There is no abdominal tenderness. There is no rebound and no guarding.  Genitourinary: Cervix exhibits motion tenderness.    Vaginal bleeding present.  There is bleeding in the vagina.    Genitourinary Comments: Pelvic exam: Cervix pink, visually closed, posterior, without lesion, scant amount of dark red vaginal bleeding (1 faux swab used to visualize cervix), vaginal walls and external genitalia normal Bimanual exam: Cervix closed, firm, anterior, POSITIVE CMT, uterus nontender, nonenlarged, adnexa without tenderness, enlargement, or mass     Results for orders placed or performed during the hospital encounter of 08/25/18 (from the past 24 hour(s))  Urinalysis, Routine w reflex microscopic     Status: Abnormal   Collection Time: 08/25/18  5:58 PM  Result Value Ref Range   Color, Urine YELLOW YELLOW   APPearance CLEAR CLEAR   Specific Gravity, Urine 1.015 1.005 - 1.030   pH 5.5 5.0 - 8.0   Glucose, UA NEGATIVE NEGATIVE mg/dL   Hgb urine dipstick MODERATE (A) NEGATIVE   Bilirubin Urine NEGATIVE NEGATIVE   Ketones, ur NEGATIVE NEGATIVE mg/dL   Protein, ur NEGATIVE NEGATIVE mg/dL   Nitrite NEGATIVE NEGATIVE   Leukocytes, UA NEGATIVE NEGATIVE  Urinalysis, Microscopic (reflex)     Status: None   Collection Time: 08/25/18  5:58 PM  Result Value Ref Range   RBC / HPF 0-5 0 - 5 RBC/hpf   WBC, UA 0-5 0 - 5 WBC/hpf   Bacteria,  UA NONE SEEN NONE SEEN   Squamous Epithelial / LPF 0-5 0 - 5  Pregnancy, urine POC     Status: Abnormal   Collection Time: 08/25/18  6:07 PM  Result Value Ref Range   Preg Test, Ur POSITIVE (A) NEGATIVE  Wet prep, genital     Status: Abnormal   Collection Time: 08/25/18  6:18 PM  Result Value Ref Range   Yeast Wet Prep HPF POC NONE SEEN NONE SEEN   Trich, Wet Prep PRESENT (A) NONE SEEN   Clue  Cells Wet Prep HPF POC PRESENT (A) NONE SEEN   WBC, Wet Prep HPF POC NONE SEEN NONE SEEN   Sperm NONE SEEN   CBC     Status: Abnormal   Collection Time: 08/25/18  6:39 PM  Result Value Ref Range   WBC 7.6 4.0 - 10.5 K/uL   RBC 4.49 3.87 - 5.11 MIL/uL   Hemoglobin 12.5 12.0 - 15.0 g/dL   HCT 39.8 36.0 - 46.0 %   MCV 88.6 80.0 - 100.0 fL   MCH 27.8 26.0 - 34.0 pg   MCHC 31.4 30.0 - 36.0 g/dL   RDW 16.3 (H) 11.5 - 15.5 %   Platelets 397 150 - 400 K/uL   nRBC 0.0 0.0 - 0.2 %  hCG, quantitative, pregnancy     Status: Abnormal   Collection Time: 08/25/18  6:39 PM  Result Value Ref Range   hCG, Beta Chain, Quant, S 185 (H) <5 mIU/mL   US Ob Less Than 14 Weeks With Ob Transvaginal  Result Date: 08/25/2018 CLINICAL DATA:  Vaginal bleeding.  Positive pregnancy test. EXAM: OBSTETRIC <14 WK [place] AND TRANSVAGINAL OB US TECHNIQUE: Both transabdominal and transvaginal ultrasound examinations were performed for complete evaluation of the gestation as well as the maternal uterus, adnexal regions, and pelvic cul-de-sac. Transvaginal technique was performed to assess early pregnancy. COMPARISON:  None. FINDINGS: Intrauterine gestational sac: None Yolk sac:  Not Visualized. Embryo:  Not Visualized. Cardiac Activity: Not Visualized. Maternal uterus/adnexae: Normal right and left ovaries. No free fluid in the pelvis. IMPRESSION: No intrauterine gestation identified. In the setting of positive pregnancy test and no definite intrauterine pregnancy, this reflects a pregnancy of unknown location. Differential  considerations include early normal IUP, abnormal IUP, or nonvisualized ectopic pregnancy. Differentiation is achieved with serial beta HCG supplemented by repeat sonography as clinically warranted. Electronically Signed   By: Lovey Newcomer M.D.   On: 08/25/2018 19:08    MAU Course  Procedures  MDM Orders Placed This Encounter  Procedures  . Wet prep, genital  . US OB LESS THAN 14 WEEKS WITH OB TRANSVAGINAL  . Urinalysis, Routine w reflex microscopic  . CBC  . hCG, quantitative, pregnancy  . Urinalysis, Microscopic (reflex)  . Pregnancy, urine POC   Offered patient treatment for GC/C due to positive CMT and pain with touching of cervix, patient declines treatment prior to going home and wants to wait until lab results return.   Labs and [place] pending. Report given to Marcille Buffy CNM @1839 .  Lajean Manes, CNM 08/25/18, 6:39 PM   Patient treated with 2g flagyl here in MAU  Assessment and Plan   1. Pregnancy of unknown anatomic location   2. Vaginal bleeding during pregnancy    DC home Comfort measures reviewed  1st Trimester precautions  Bleeding precautions Ectopic precautions RX: Expedited partner treatment provided: Darliss Cheney  Return to MAU as needed   [facility] Follow up.   Specialty:  Obstetrics and Gynecology Why:  Monday at 8:30 am for blood work Contact information: [place] Benoit Corning, CNM  08/25/18  7:26 PM

## 2018-08-28 ENCOUNTER — Ambulatory Visit (INDEPENDENT_AMBULATORY_CARE_PROVIDER_SITE_OTHER): Payer: Medicaid Other | Admitting: *Deleted

## 2018-08-28 DIAGNOSIS — O3680X0 Pregnancy with inconclusive fetal viability, not applicable or unspecified: Secondary | ICD-10-CM

## 2018-08-28 LAB — HCG, QUANTITATIVE, PREGNANCY: hCG, Beta Chain, Quant, S: 70 m[IU]/mL — ABNORMAL HIGH (ref <5)

## 2018-08-28 LAB — GC/CHLAMYDIA PROBE AMP (~~LOC~~) NOT AT ARMC
Chlamydia: NEGATIVE
Neisseria Gonorrhea: NEGATIVE

## 2018-08-28 NOTE — Progress Notes (Signed)
Patient ID: Morgan Newton, female   DOB: Nov 17, 1985, 33 y.o.   MRN: [id number] I have reviewed the chart and agree with nursing staff's documentation of this patient's encounter.  Emeterio Reeve, MD 08/28/2018 11:04 AM

## 2018-08-28 NOTE — Progress Notes (Signed)
Here for stat bhcg. Denies pain. Denies bleeding. Explained we will draw stat bhcg and have her wait in lobby for results which we will then discuss with provider and then her. She voices understanding.  LInda,RN Reviewed results with Dr. Roselie Awkward and informed patient bhcg levels indicate miscarriage and we recommend weekly bhcg until returned to normal. Also offered to see provider in 2-3 weeks for birth contol, etc which she accepted. Support given. She voices understanding. Linda,RN

## 2018-09-04 ENCOUNTER — Other Ambulatory Visit: Payer: Medicaid Other

## 2018-09-04 DIAGNOSIS — O039 Complete or unspecified spontaneous abortion without complication: Secondary | ICD-10-CM | POA: Diagnosis not present

## 2018-09-05 LAB — BETA HCG QUANT (REF LAB): HCG QUANT: 5 m[IU]/mL

## 2018-09-12 ENCOUNTER — Ambulatory Visit: Payer: Medicaid Other | Admitting: Student

## 2019-06-19 ENCOUNTER — Encounter (HOSPITAL_COMMUNITY): Payer: Self-pay | Admitting: *Deleted

## 2019-06-19 ENCOUNTER — Other Ambulatory Visit: Payer: Self-pay

## 2019-06-19 ENCOUNTER — Inpatient Hospital Stay (HOSPITAL_COMMUNITY)
Admission: AD | Admit: 2019-06-19 | Discharge: 2019-06-19 | Disposition: A | Discharge status: Home / Self Care | Payer: Medicaid Other | Attending: Obstetrics & Gynecology | Admitting: Obstetrics & Gynecology

## 2019-06-19 ENCOUNTER — Inpatient Hospital Stay (HOSPITAL_COMMUNITY): Payer: Medicaid Other

## 2019-06-19 DIAGNOSIS — O99331 Smoking (tobacco) complicating pregnancy, first trimester: Secondary | ICD-10-CM | POA: Insufficient documentation

## 2019-06-19 DIAGNOSIS — Z791 Long term (current) use of non-steroidal anti-inflammatories (NSAID): Secondary | ICD-10-CM | POA: Diagnosis not present

## 2019-06-19 DIAGNOSIS — F17210 Nicotine dependence, cigarettes, uncomplicated: Secondary | ICD-10-CM | POA: Diagnosis not present

## 2019-06-19 DIAGNOSIS — R1032 Left lower quadrant pain: Secondary | ICD-10-CM | POA: Insufficient documentation

## 2019-06-19 DIAGNOSIS — O219 Vomiting of pregnancy, unspecified: Secondary | ICD-10-CM | POA: Diagnosis not present

## 2019-06-19 DIAGNOSIS — Z825 Family history of asthma and other chronic lower respiratory diseases: Secondary | ICD-10-CM | POA: Diagnosis not present

## 2019-06-19 DIAGNOSIS — O99891 Other specified diseases and conditions complicating pregnancy: Secondary | ICD-10-CM | POA: Insufficient documentation

## 2019-06-19 DIAGNOSIS — O99511 Diseases of the respiratory system complicating pregnancy, first trimester: Secondary | ICD-10-CM | POA: Insufficient documentation

## 2019-06-19 DIAGNOSIS — O34219 Maternal care for unspecified type scar from previous cesarean delivery: Secondary | ICD-10-CM | POA: Diagnosis not present

## 2019-06-19 DIAGNOSIS — Z91018 Allergy to other foods: Secondary | ICD-10-CM | POA: Insufficient documentation

## 2019-06-19 DIAGNOSIS — R102 Pelvic and perineal pain: Secondary | ICD-10-CM | POA: Diagnosis not present

## 2019-06-19 DIAGNOSIS — Z79899 Other long term (current) drug therapy: Secondary | ICD-10-CM | POA: Insufficient documentation

## 2019-06-19 DIAGNOSIS — R109 Unspecified abdominal pain: Secondary | ICD-10-CM

## 2019-06-19 DIAGNOSIS — Z880 Allergy status to penicillin: Secondary | ICD-10-CM | POA: Insufficient documentation

## 2019-06-19 DIAGNOSIS — Z3A01 Less than 8 weeks gestation of pregnancy: Secondary | ICD-10-CM

## 2019-06-19 DIAGNOSIS — J45909 Unspecified asthma, uncomplicated: Secondary | ICD-10-CM | POA: Insufficient documentation

## 2019-06-19 DIAGNOSIS — R110 Nausea: Secondary | ICD-10-CM

## 2019-06-19 DIAGNOSIS — O26891 Other specified pregnancy related conditions, first trimester: Secondary | ICD-10-CM | POA: Diagnosis not present

## 2019-06-19 LAB — URINALYSIS, ROUTINE W REFLEX MICROSCOPIC
Bilirubin Urine: NEGATIVE
Glucose, UA: NEGATIVE mg/dL
Hgb urine dipstick: NEGATIVE
Ketones, ur: NEGATIVE mg/dL
Leukocytes,Ua: NEGATIVE
Nitrite: NEGATIVE
Protein, ur: NEGATIVE mg/dL
Specific Gravity, Urine: 1.004 — ABNORMAL LOW (ref 1.005–1.030)
pH: 7 (ref 5.0–8.0)

## 2019-06-19 LAB — CBC
HCT: 39.6 % (ref 36.0–46.0)
Hemoglobin: 13 g/dL (ref 12.0–15.0)
MCH: 30 pg (ref 26.0–34.0)
MCHC: 32.8 g/dL (ref 30.0–36.0)
MCV: 91.2 fL (ref 80.0–100.0)
Platelets: 382 10*3/uL (ref 150–400)
RBC: 4.34 MIL/uL (ref 3.87–5.11)
RDW: 13.7 % (ref 11.5–15.5)
WBC: 9.7 10*3/uL (ref 4.0–10.5)
nRBC: 0 % (ref 0.0–0.2)

## 2019-06-19 LAB — COMPREHENSIVE METABOLIC PANEL
ALT: 17 U/L (ref 0–44)
AST: 16 U/L (ref 15–41)
Albumin: 3.6 g/dL (ref 3.5–5.0)
Alkaline Phosphatase: 51 U/L (ref 38–126)
Anion gap: 11 (ref 5–15)
BUN: <5 mg/dL — ABNORMAL LOW (ref 6–20)
CO2: 25 mmol/L (ref 22–32)
Calcium: 9.2 mg/dL (ref 8.9–10.3)
Chloride: 101 mmol/L (ref 98–111)
Creatinine, Ser: 0.64 mg/dL (ref 0.44–1.00)
GFR calc Af Amer: >60 mL/min (ref >60)
GFR calc non Af Amer: >60 mL/min (ref >60)
Glucose, Bld: 78 mg/dL (ref 70–99)
Potassium: 4.2 mmol/L (ref 3.5–5.1)
Sodium: 137 mmol/L (ref 135–145)
Total Bilirubin: 0.5 mg/dL (ref 0.3–1.2)
Total Protein: 6.8 g/dL (ref 6.5–8.1)

## 2019-06-19 LAB — HCG, QUANTITATIVE, PREGNANCY: hCG, Beta Chain, Quant, S: 72752 m[IU]/mL — ABNORMAL HIGH (ref <5)

## 2019-06-19 LAB — POCT PREGNANCY, URINE: Preg Test, Ur: POSITIVE — AB

## 2019-06-19 LAB — WET PREP, GENITAL
Clue Cells Wet Prep HPF POC: NONE SEEN
Sperm: NONE SEEN
Trich, Wet Prep: NONE SEEN
Yeast Wet Prep HPF POC: NONE SEEN

## 2019-06-19 MED ORDER — ONDANSETRON 4 MG PO TBDP: 4.0000 mg | ORAL_TABLET | Freq: Three times a day (TID) | ORAL | 0 refills | Status: DC | PRN

## 2019-06-19 MED ORDER — ACETAMINOPHEN 325 MG PO TABS: 650.0000 mg | ORAL_TABLET | Freq: Once | ORAL | Status: DC

## 2019-06-19 MED ORDER — ONDANSETRON 4 MG PO TBDP
4.0000 mg | ORAL_TABLET | Freq: Once | ORAL | Status: AC
  Administered 2019-06-19: 4 mg via ORAL
  Filled 2019-06-19: qty 1

## 2019-06-19 NOTE — Discharge Instructions (Signed)
Abdominal Pain During Pregnancy  Belly (abdominal) pain is common during pregnancy. There are many possible causes. Most of the time, it is not a serious problem. Other times, it can be a sign that something is wrong with the pregnancy. Always tell your doctor if you have belly pain. Follow these instructions at home:  Do not have sex or put anything in your vagina until your pain goes away completely.  Get plenty of rest until your pain gets better.  Drink enough fluid to keep your pee (urine) pale yellow.  Take over-the-counter and prescription medicines only as told by your doctor.  Keep all follow-up visits as told by your doctor. This is important. Contact a doctor if:  Your pain continues or gets worse after resting.  You have lower belly pain that: ? Comes and goes at regular times. ? Spreads to your back. ? Feels like menstrual cramps.  You have pain or burning when you pee (urinate). Get help right away if:  You have a fever or chills.  You have vaginal bleeding.  You are leaking fluid from your vagina.  You are passing tissue from your vagina.  You throw up (vomit) for more than 24 hours.  You have watery poop (diarrhea) for more than 24 hours.  Your baby is moving less than usual.  You feel very weak or faint.  You have shortness of breath.  You have very bad pain in your upper belly. Summary  Belly (abdominal) pain is common during pregnancy. There are many possible causes.  If you have belly pain during pregnancy, tell your doctor right away.  Keep all follow-up visits as told by your doctor. This is important. This information is not intended to replace advice given to you by your health care provider. Make sure you discuss any questions you have with your health care provider. Document Released: 06/23/2009 Document Revised: 10/23/2018 Document Reviewed: 10/07/2016 Elsevier Patient Education  2020 [employer].

## 2019-06-19 NOTE — MAU Note (Signed)
.   Morgan Newton is a 33 y.o. at 73w1dhere in MAU reporting: lower left abdominal pain like period cramps that began 3-4 weeks ago. Patient states it got worse Sunday 06/17/19. No VB or abnormal discharge. Pain score: 6 Vitals:   06/19/19 1159  BP: 117/73  Pulse: 84  Resp: 18  Temp: 98.6 F (37 C)  SpO2: 100%

## 2019-06-19 NOTE — MAU Provider Note (Signed)
History     CSN: [id number]  Arrival date and time: 06/19/19 1136   First Provider Initiated Contact with Patient 06/19/19 1222      Chief Complaint  Patient presents with  . Abdominal Pain   HPI  Morgan Newton is a 33 yo [id number] with unsure LMP who is presenting with LLQ abdominal cramping x3-4 weeks. She describes it as period-like cramps, with occasional episodes of sharpness. She reports a history of miscarriage early this spring, but denies any vaginal bleeding or discharge. She has not taken any pain medication, is worried about ectopic pregnancy or ovarian cyst.  OB History    Gravida  6   Para  3   Term  2   Preterm  1   AB  2   Living  2     SAB  1   TAB  1   Ectopic      Multiple      Live Births  3           Past Medical History:  Diagnosis Date  . Abnormal Pap smear 2012   colpo  . Anemia   . Asthma   . Chlamydia   . Depression   . Genital herpes   . Gonorrhea   . Heart murmur   . Hypertension    PIH  . Infection    urinary tract infection  . PID (acute pelvic inflammatory disease)   . Pregnancy induced hypertension   . Trichimoniasis     Past Surgical History:  Procedure Laterality Date  . CESAREAN SECTION  07/22/2012   Procedure: CESAREAN SECTION;  Surgeon: Frederico Hamman, MD;  [facility];  Service: Obstetrics;  Laterality: N/A;  Primary Cesarean Section   . GANGLION CYST EXCISION    . MOUTH SURGERY      Family History  Problem Relation Age of Onset  . Hypertension Maternal Grandfather   . Hypertension Mother   . Thyroid disease Mother   . Hypertension Maternal Grandmother   . Thyroid disease Maternal Grandmother   . Asthma Father   . Asthma Maternal Aunt   . Asthma Paternal Grandmother   . Other Neg Hx   . Hearing loss Neg Hx     Social History   Tobacco Use  . Smoking status: Current Some Day Smoker    Packs/day: 0.25    Types: Cigarettes  . Smokeless tobacco: Never Used  Substance Use Topics  . Alcohol  use: Yes    Comment: rarely  . Drug use: No    Allergies:  Allergies  Allergen Reactions  . Penicillins Shortness Of Breath and Palpitations    .Marland KitchenHas patient had a PCN reaction causing immediate rash, facial/tongue/throat swelling, SOB or lightheadedness with hypotension: NO Has patient had a PCN reaction causing severe rash involving mucus membranes or skin necrosis: NO Has patient had a PCN reaction that required hospitalization No Has patient had a PCN reaction occurring within the last 10 years: No If all of the above answers are "NO", then may proceed with Cephalosporin use.   . Caffeine Other (See Comments)    Heart murmur    Medications Prior to Admission  Medication Sig Dispense Refill Last Dose  . cefUROXime (CEFTIN) 250 MG tablet Take 1 tablet (250 mg total) by mouth 2 (two) times daily with a meal. 19 tablet 0   . ibuprofen (ADVIL,MOTRIN) 200 MG tablet Take 600 mg by mouth every 6 (six) hours as needed. pain     .  ibuprofen (ADVIL,MOTRIN) 800 MG tablet Take 1 tablet (800 mg total) by mouth 3 (three) times daily. 21 tablet 0   . ondansetron (ZOFRAN ODT) 4 MG disintegrating tablet Take 1 tablet (4 mg total) by mouth every 8 (eight) hours as needed for nausea or vomiting. 20 tablet 0     Review of Systems  All other systems reviewed and are negative.  Physical Exam   Blood pressure 117/73, pulse 84, temperature 98.6 F (37 C), temperature source Oral, resp. rate 18, height 4' 11"  (1.499 m), weight 86.5 kg, last menstrual period 04/23/2019, SpO2 100 %, unknown if currently breastfeeding.  Physical Exam  Nursing note and vitals reviewed. Constitutional: She is oriented to person, place, and time. She appears well-developed and well-nourished.  HENT:  Head: Normocephalic and atraumatic.  Eyes: Pupils are equal, round, and reactive to light. EOM are normal.  Neck: Normal range of motion. Neck supple.  Cardiovascular: Normal rate, regular rhythm, normal heart sounds and  intact distal pulses.  Respiratory: Effort normal and breath sounds normal.  GI: Soft. Bowel sounds are normal. She exhibits no distension and no mass. There is abdominal tenderness (LLQ). There is rebound (mild, LLQ). There is no guarding.  Genitourinary:    Vagina normal.     Genitourinary Comments: Retroverted, retroflexed   Musculoskeletal: Normal range of motion.  Neurological: She is alert and oriented to person, place, and time. She has normal reflexes.  Skin: Skin is warm and dry.  Psychiatric: She has a normal mood and affect. Her behavior is normal. Judgment and thought content normal.    MAU Course  Procedures  MDM -CMP, CBC -bHCQ quant and [place] to determine location of pregnancy -zofran for nausea -tylenol for pain  RECHECK: -nausea improved with zofran -pain resolved with tylenol  Results for orders placed or performed during the hospital encounter of 06/19/19 (from the past 24 hour(s))  Pregnancy, urine POC     Status: Abnormal   Collection Time: 06/19/19 11:50 AM  Result Value Ref Range   Preg Test, Ur POSITIVE (A) NEGATIVE  Urinalysis, Routine w reflex microscopic     Status: Abnormal   Collection Time: 06/19/19 11:51 AM  Result Value Ref Range   Color, Urine STRAW (A) YELLOW   APPearance CLEAR CLEAR   Specific Gravity, Urine 1.004 (L) 1.005 - 1.030   pH 7.0 5.0 - 8.0   Glucose, UA NEGATIVE NEGATIVE mg/dL   Hgb urine dipstick NEGATIVE NEGATIVE   Bilirubin Urine NEGATIVE NEGATIVE   Ketones, ur NEGATIVE NEGATIVE mg/dL   Protein, ur NEGATIVE NEGATIVE mg/dL   Nitrite NEGATIVE NEGATIVE   Leukocytes,Ua NEGATIVE NEGATIVE  Wet prep, genital     Status: Abnormal   Collection Time: 06/19/19 12:39 PM   Specimen: Cervix  Result Value Ref Range   Yeast Wet Prep HPF POC NONE SEEN NONE SEEN   Trich, Wet Prep NONE SEEN NONE SEEN   Clue Cells Wet Prep HPF POC NONE SEEN NONE SEEN   WBC, Wet Prep HPF POC FEW (A) NONE SEEN   Sperm NONE SEEN   Comprehensive metabolic  panel     Status: Abnormal   Collection Time: 06/19/19 12:57 PM  Result Value Ref Range   Sodium 137 135 - 145 mmol/L   Potassium 4.2 3.5 - 5.1 mmol/L   Chloride 101 98 - 111 mmol/L   CO2 25 22 - 32 mmol/L   Glucose, Bld 78 70 - 99 mg/dL   BUN <5 (L) 6 - 20 mg/dL  Creatinine, Ser 0.64 0.44 - 1.00 mg/dL   Calcium 9.2 8.9 - 10.3 mg/dL   Total Protein 6.8 6.5 - 8.1 g/dL   Albumin 3.6 3.5 - 5.0 g/dL   AST 16 15 - 41 U/L   ALT 17 0 - 44 U/L   Alkaline Phosphatase 51 38 - 126 U/L   Total Bilirubin 0.5 0.3 - 1.2 mg/dL   GFR calc non Af Amer >60 >60 mL/min   GFR calc Af Amer >60 >60 mL/min   Anion gap 11 5 - 15  CBC     Status: None   Collection Time: 06/19/19 12:57 PM  Result Value Ref Range   WBC 9.7 4.0 - 10.5 K/uL   RBC 4.34 3.87 - 5.11 MIL/uL   Hemoglobin 13.0 12.0 - 15.0 g/dL   HCT 39.6 36.0 - 46.0 %   MCV 91.2 80.0 - 100.0 fL   MCH 30.0 26.0 - 34.0 pg   MCHC 32.8 30.0 - 36.0 g/dL   RDW 13.7 11.5 - 15.5 %   Platelets 382 150 - 400 K/uL   nRBC 0.0 0.0 - 0.2 %  hCG, quantitative, pregnancy     Status: Abnormal   Collection Time: 06/19/19 12:57 PM  Result Value Ref Range   hCG, Beta Chain, Quant, S 72,752 (H) <5 mIU/mL   US Ob Less Than 14 Weeks With Ob Transvaginal  Result Date: 06/19/2019 CLINICAL DATA:  Pelvic pain EXAM: OBSTETRIC <14 WK [place] AND TRANSVAGINAL OB US TECHNIQUE: Both transabdominal and transvaginal ultrasound examinations were performed for complete evaluation of the gestation as well as the maternal uterus, adnexal regions, and pelvic cul-de-sac. Transvaginal technique was performed to assess early pregnancy. COMPARISON:  None. FINDINGS: Intrauterine gestational sac: Visualized Yolk sac:  Visualized Embryo:  Visualized Cardiac Activity: Visualized Heart Rate: 143 bpm CRL:  11 mm   7 w   1 d                  [place] [facility]: February 04 2020 Subchorionic hemorrhage:  None visualized. Maternal uterus/adnexae: Uterus is retroverted. Cervical os is closed. Right ovary measures 3.4  x 2.4 x 2.0 cm. Left ovary measures 2.5 x 2.1 x 1.3 cm. No extrauterine pelvic mass or free pelvic fluid. IMPRESSION: Single live intrauterine gestation with estimated gestational age of approximately 61 weeks. No subchorionic hemorrhage evident. Uterus retroverted. No extrauterine pelvic mass or free pelvic fluid. Electronically Signed   By: Lowella Grip III M.D.   On: 06/19/2019 13:29     Assessment and Plan  33 yo [id number] at 7.1 EGA by first trimester [place] with abdominal pain -CMP within normal limits -US showed SIUP with CRL measuring 7.1 EGA, no cysts or ectopic -DC home in stable condition, recommended establish OB care, list of providers given -Zofran sent for nausea -Strict return precautions given  Yoshimi Sarr L Layann Bluett 06/19/2019, 12:40 PM

## 2019-06-20 LAB — GC/CHLAMYDIA PROBE AMP (~~LOC~~) NOT AT ARMC
Chlamydia: NEGATIVE
Comment: NEGATIVE
Comment: NORMAL
Neisseria Gonorrhea: NEGATIVE

## 2019-06-26 ENCOUNTER — Other Ambulatory Visit: Payer: Self-pay

## 2019-06-26 ENCOUNTER — Encounter (HOSPITAL_COMMUNITY): Payer: Self-pay

## 2019-06-26 ENCOUNTER — Inpatient Hospital Stay (HOSPITAL_COMMUNITY)
Admission: AD | Admit: 2019-06-26 | Discharge: 2019-06-26 | Disposition: A | Discharge status: Home / Self Care | Payer: Medicaid Other | Attending: Obstetrics & Gynecology | Admitting: Obstetrics & Gynecology

## 2019-06-26 ENCOUNTER — Inpatient Hospital Stay (HOSPITAL_COMMUNITY): Payer: Medicaid Other

## 2019-06-26 DIAGNOSIS — O99011 Anemia complicating pregnancy, first trimester: Secondary | ICD-10-CM | POA: Diagnosis not present

## 2019-06-26 DIAGNOSIS — O208 Other hemorrhage in early pregnancy: Secondary | ICD-10-CM | POA: Diagnosis not present

## 2019-06-26 DIAGNOSIS — D649 Anemia, unspecified: Secondary | ICD-10-CM | POA: Insufficient documentation

## 2019-06-26 DIAGNOSIS — O418X10 Other specified disorders of amniotic fluid and membranes, first trimester, not applicable or unspecified: Secondary | ICD-10-CM | POA: Diagnosis not present

## 2019-06-26 DIAGNOSIS — O99511 Diseases of the respiratory system complicating pregnancy, first trimester: Secondary | ICD-10-CM | POA: Diagnosis not present

## 2019-06-26 DIAGNOSIS — Z79899 Other long term (current) drug therapy: Secondary | ICD-10-CM | POA: Insufficient documentation

## 2019-06-26 DIAGNOSIS — Z6790 Unspecified blood type, Rh positive: Secondary | ICD-10-CM | POA: Insufficient documentation

## 2019-06-26 DIAGNOSIS — R102 Pelvic and perineal pain: Secondary | ICD-10-CM | POA: Diagnosis not present

## 2019-06-26 DIAGNOSIS — J45909 Unspecified asthma, uncomplicated: Secondary | ICD-10-CM | POA: Insufficient documentation

## 2019-06-26 DIAGNOSIS — O26891 Other specified pregnancy related conditions, first trimester: Secondary | ICD-10-CM | POA: Diagnosis not present

## 2019-06-26 DIAGNOSIS — F17210 Nicotine dependence, cigarettes, uncomplicated: Secondary | ICD-10-CM | POA: Insufficient documentation

## 2019-06-26 DIAGNOSIS — Z3A08 8 weeks gestation of pregnancy: Secondary | ICD-10-CM | POA: Diagnosis not present

## 2019-06-26 DIAGNOSIS — O99331 Smoking (tobacco) complicating pregnancy, first trimester: Secondary | ICD-10-CM | POA: Diagnosis not present

## 2019-06-26 DIAGNOSIS — O4690 Antepartum hemorrhage, unspecified, unspecified trimester: Secondary | ICD-10-CM

## 2019-06-26 DIAGNOSIS — O468X1 Other antepartum hemorrhage, first trimester: Secondary | ICD-10-CM | POA: Diagnosis not present

## 2019-06-26 DIAGNOSIS — O161 Unspecified maternal hypertension, first trimester: Secondary | ICD-10-CM | POA: Diagnosis not present

## 2019-06-26 LAB — URINALYSIS, ROUTINE W REFLEX MICROSCOPIC
Bilirubin Urine: NEGATIVE
Glucose, UA: NEGATIVE mg/dL
Hgb urine dipstick: NEGATIVE
Ketones, ur: NEGATIVE mg/dL
Leukocytes,Ua: NEGATIVE
Nitrite: NEGATIVE
Protein, ur: NEGATIVE mg/dL
Specific Gravity, Urine: 1.02 (ref 1.005–1.030)
pH: 6 (ref 5.0–8.0)

## 2019-06-26 LAB — CBC WITH DIFFERENTIAL/PLATELET
Abs Immature Granulocytes: 0.05 10*3/uL (ref 0.00–0.07)
Basophils Absolute: 0 10*3/uL (ref 0.0–0.1)
Basophils Relative: 0 %
Eosinophils Absolute: 0.1 10*3/uL (ref 0.0–0.5)
Eosinophils Relative: 1 %
HCT: 40.1 % (ref 36.0–46.0)
Hemoglobin: 13.3 g/dL (ref 12.0–15.0)
Immature Granulocytes: 1 %
Lymphocytes Relative: 33 %
Lymphs Abs: 3.5 10*3/uL (ref 0.7–4.0)
MCH: 30.2 pg (ref 26.0–34.0)
MCHC: 33.2 g/dL (ref 30.0–36.0)
MCV: 91.1 fL (ref 80.0–100.0)
Monocytes Absolute: 0.8 10*3/uL (ref 0.1–1.0)
Monocytes Relative: 8 %
Neutro Abs: 5.9 10*3/uL (ref 1.7–7.7)
Neutrophils Relative %: 57 %
Platelets: 375 10*3/uL (ref 150–400)
RBC: 4.4 MIL/uL (ref 3.87–5.11)
RDW: 14 % (ref 11.5–15.5)
WBC: 10.4 10*3/uL (ref 4.0–10.5)
nRBC: 0 % (ref 0.0–0.2)

## 2019-06-26 MED ORDER — CYCLOBENZAPRINE HCL 10 MG PO TABS: 10.0000 mg | ORAL_TABLET | Freq: Once | ORAL | Status: DC

## 2019-06-26 MED ORDER — ACETAMINOPHEN 500 MG PO TABS
1000.0000 mg | ORAL_TABLET | Freq: Once | ORAL | Status: AC
  Administered 2019-06-26: 1000 mg via ORAL
  Filled 2019-06-26: qty 2

## 2019-06-26 NOTE — MAU Note (Signed)
.   Morgan Newton is a 33 y.o. at 78w1dhere in MAU reporting: lower back and abdominal pain that is ongoing. Reports moderate amount of vaginal bleeding today. Intercourse last night LMP: 04/23/19 Onset of complaint: today Pain score: 7 Vitals:   06/26/19 1837  BP: 121/66  Pulse: 66  Resp: 16  Temp: 98.5 F (36.9 C)     FHT: Lab orders placed from triage: UA

## 2019-06-26 NOTE — Discharge Instructions (Signed)
Safe Medications in Pregnancy    Acne: Benzoyl Peroxide Salicylic Acid  Backache/Headache: Tylenol: 2 regular strength every 4 hours OR              2 Extra strength every 6 hours  Colds/Coughs/Allergies: Benadryl (alcohol free) 25 mg every 6 hours as needed Breath right strips Claritin Cepacol throat lozenges Chloraseptic throat spray Cold-Eeze- up to three times per day Cough drops, alcohol free Flonase (by prescription only) Guaifenesin Mucinex Robitussin DM (plain only, alcohol free) Saline nasal spray/drops Sudafed (pseudoephedrine) & Actifed ** use only after [redacted] weeks gestation and if you do not have high blood pressure Tylenol Vicks Vaporub Zinc lozenges Zyrtec   Constipation: Colace Ducolax suppositories Fleet enema Glycerin suppositories Metamucil Milk of magnesia Miralax Senokot Smooth move tea  Diarrhea: Kaopectate Imodium A-D  *NO pepto Bismol  Hemorrhoids: Anusol Anusol HC Preparation H Tucks  Indigestion: Tums Maalox Mylanta Zantac  Pepcid  Insomnia: Benadryl (alcohol free) 16m every 6 hours as needed Tylenol PM Unisom, no Gelcaps  Leg Cramps: Tums MagGel  Nausea/Vomiting:  Bonine Dramamine Emetrol Ginger extract Sea bands Meclizine  Nausea medication to take during pregnancy:  Unisom (doxylamine succinate 25 mg tablets) Take one tablet daily at bedtime. If symptoms are not adequately controlled, the dose can be increased to a maximum recommended dose of two tablets daily (1/2 tablet in the morning, 1/2 tablet mid-afternoon and one at bedtime). Vitamin B6 1071mtablets. Take one tablet twice a day (up to 200 mg per day).  Skin Rashes: Aveeno products Benadryl cream or 2542mvery 6 hours as needed Calamine Lotion 1% cortisone cream  Yeast infection: Gyne-lotrimin 7 Monistat 7   **If taking multiple medications, please check labels to avoid duplicating the same active ingredients **take  medication as directed on the label ** Do not exceed 4000 mg of tylenol in 24 hours **Do not take medications that contain aspirin or ibuprofen   [facility]     Phone: [phone] [place]hone: [address] [facility] [place]: [address] [facility] [facility]                        Phone: [address] [facility]       Phone: [phone]/Gyn and Infertility    Phone: [phone] ([place]  Phone: 336[place]/[facility]    Phone: [phone]    Phone: [phone] Phone: [phone]-Maternity  Phone: [address]            Phone: [phone] GreNorth Weeki WacheePhone: [phone]    Phone: [phone]               Subchorionic Hematoma  A subchorionic hematoma is a gathering of blood between the outer wall of the embryo (chorion) and the inner wall of the womb (uterus). This condition can cause vaginal bleeding. If they cause little or no vaginal bleeding, early small hematomas usually shrink on their own and do not affect your baby or pregnancy. When bleeding  starts later in pregnancy, or if the hematoma is larger or occurs in older pregnant women, the condition may be more serious. Larger hematomas may get bigger, which increases the chances of miscarriage. This condition also increases the risk of:  Premature separation of the placenta from the uterus.  Premature (preterm) labor.  Stillbirth. What are the causes? The exact cause of this condition is not known. It occurs when blood is trapped  between the placenta and the uterine wall because the placenta has separated from the original site of implantation. What increases the risk? You are more likely to develop this condition if:  You were treated with fertility medicines.  You conceived through in vitro fertilization (IVF). What are the signs or symptoms? Symptoms of this condition include:  Vaginal spotting or bleeding.  Contractions of the uterus. These cause abdominal pain. Sometimes you may have no symptoms and the bleeding may only be seen when ultrasound images are taken (transvaginal ultrasound). How is this diagnosed? This condition is diagnosed based on a physical exam. This includes a pelvic exam. You may also have other tests, including:  Blood tests.  Urine tests.  Ultrasound of the abdomen. How is this treated? Treatment for this condition can vary. Treatment may include:  Watchful waiting. You will be monitored closely for any changes in bleeding. During this stage: ? The hematoma may be reabsorbed by the body. ? The hematoma may separate the fluid-filled space containing the embryo (gestational sac) from the wall of the womb (endometrium).  Medicines.  Activity restriction. This may be needed until the bleeding stops. Follow these instructions at home:  Stay on bed rest if told to do so by your health care provider.  Do not lift anything that is heavier than 10 lbs. (4.5 kg) or as told by your health care provider.  Do not use any products that contain nicotine or tobacco, such as cigarettes and e-cigarettes. If you need help quitting, ask your health care provider.  Track and write down the number of pads you use each day and how soaked (saturated) they are.  Do not use tampons.  Keep all follow-up visits as told by your health care provider. This is important. Your health care provider may ask you to have follow-up blood tests or ultrasound tests or both. Contact a health care provider  if:  You have any vaginal bleeding.  You have a fever. Get help right away if:  You have severe cramps in your stomach, back, abdomen, or pelvis.  You pass large clots or tissue. Save any tissue for your health care provider to look at.  You have more vaginal bleeding, and you faint or become lightheaded or weak. Summary  A subchorionic hematoma is a gathering of blood between the outer wall of the placenta and the uterus.  This condition can cause vaginal bleeding.  Sometimes you may have no symptoms and the bleeding may only be seen when ultrasound images are taken.  Treatment may include watchful waiting, medicines, or activity restriction. This information is not intended to replace advice given to you by your health care provider. Make sure you discuss any questions you have with your health care provider. Document Released: 10/20/2006 Document Revised: 06/17/2017 Document Reviewed: 08/31/2016 Elsevier Patient Education  2020 [employer].  Vaginal Bleeding During Pregnancy, First Trimester  A small amount of bleeding from the vagina (spotting) is relatively common during early pregnancy. It usually stops on its own. Various things may cause bleeding or spotting  during early pregnancy. Some bleeding may be related to the pregnancy, and some may not. In many cases, the bleeding is normal and is not a problem. However, bleeding can also be a sign of something serious. Be sure to tell your health care provider about any vaginal bleeding right away. Some possible causes of vaginal bleeding during the first trimester include:  Infection or inflammation of the cervix.  Growths (polyps) on the cervix.  Miscarriage or threatened miscarriage.  Pregnancy tissue developing outside of the uterus (ectopic pregnancy).  A mass of tissue developing in the uterus due to an egg being fertilized incorrectly (molar pregnancy). Follow these instructions at home: Activity  Follow  instructions from your health care provider about limiting your activity. Ask what activities are safe for you.  If needed, make plans for someone to help with your regular activities.  Do not have sex or orgasms until your health care provider says that this is safe. General instructions  Take over-the-counter and prescription medicines only as told by your health care provider.  Pay attention to any changes in your symptoms.  Do not use tampons or douche.  Write down how many pads you use each day, how often you change pads, and how soaked (saturated) they are.  If you pass any tissue from your vagina, save the tissue so you can show it to your health care provider.  Keep all follow-up visits as told by your health care provider. This is important. Contact a health care provider if:  You have vaginal bleeding during any part of your pregnancy.  You have cramps or labor pains.  You have a fever. Get help right away if:  You have severe cramps in your back or abdomen.  You pass large clots or a large amount of tissue from your vagina.  Your bleeding increases.  You feel light-headed or weak, or you faint.  You have chills.  You are leaking fluid or have a gush of fluid from your vagina. Summary  A small amount of bleeding (spotting) from the vagina is relatively common during early pregnancy.  Various things may cause bleeding or spotting in early pregnancy.  Be sure to tell your health care provider about any vaginal bleeding right away. This information is not intended to replace advice given to you by your health care provider. Make sure you discuss any questions you have with your health care provider. Document Released: 04/14/2005 Document Revised: 10/24/2018 Document Reviewed: 10/07/2016 Elsevier Patient Education  2020 [employer].

## 2019-06-26 NOTE — MAU Provider Note (Signed)
History     CSN: [id number]  Arrival date and time: 06/26/19 1801   First Provider Initiated Contact with Patient 06/26/19 1917      Chief Complaint  Patient presents with  . Vaginal Bleeding  . Abdominal Pain   [name] is a 33 y.o. [phone] at 10w1dwho presents to MAU for vaginal bleeding which began this morning around 0530AM. Pt reports the bleeding stopped around 0500PM today and patient denies any bleeding at this time. Pt reports she had intercourse last night. Pt found to have an IUP on 06/19/2019 in MAU.  Passing blood clots? no Blood soaking clothes? no Lightheaded/dizzy? no Significant pelvic pain or cramping? yes, cramping since she found out she was pregnant, was seen in MAU on 06/19/2019 for this issue Passed any tissue? no  Current pregnancy problems? Pt has not yet been seen Blood Type? O Positive Allergies? PCN, caffeine Current medications? none Current PNC & next appt? none, pt plans termination  Pt denies vaginal discharge/odor/itching. Pt denies N/V, abdominal pain, constipation, diarrhea, or urinary problems. Pt denies fever, chills, fatigue, sweating or changes in appetite. Pt denies SOB or chest pain. Pt denies dizziness, HA, light-headedness, weakness.   OB History    Gravida  6   Para  3   Term  2   Preterm  1   AB  2   Living  2     SAB  1   TAB  1   Ectopic      Multiple      Live Births  3           Past Medical History:  Diagnosis Date  . Abnormal Pap smear 2012   colpo  . Anemia   . Asthma   . Chlamydia   . Depression   . Genital herpes   . Gonorrhea   . Heart murmur   . Hypertension    PIH  . Infection    urinary tract infection  . PID (acute pelvic inflammatory disease)   . Pregnancy induced hypertension   . Trichimoniasis     Past Surgical History:  Procedure Laterality Date  . CESAREAN SECTION  07/22/2012   Procedure: CESAREAN SECTION;  Surgeon: BFrederico Hamman MD;  [facility];   Service: Obstetrics;  Laterality: N/A;  Primary Cesarean Section   . GANGLION CYST EXCISION    . MOUTH SURGERY      Family History  Problem Relation Age of Onset  . Hypertension Maternal Grandfather   . Hypertension Mother   . Thyroid disease Mother   . Hypertension Maternal Grandmother   . Thyroid disease Maternal Grandmother   . Asthma Father   . Asthma Maternal Aunt   . Asthma Paternal Grandmother   . Other Neg Hx   . Hearing loss Neg Hx     Social History   Tobacco Use  . Smoking status: Current Some Day Smoker    Packs/day: 0.25    Types: Cigarettes  . Smokeless tobacco: Never Used  Substance Use Topics  . Alcohol use: Not Currently    Comment: rarely  . Drug use: No    Allergies:  Allergies  Allergen Reactions  . Penicillins Shortness Of Breath and Palpitations    ..Marland Kitchenas patient had a PCN reaction causing immediate rash, facial/tongue/throat swelling, SOB or lightheadedness with hypotension: NO Has patient had a PCN reaction causing severe rash involving mucus membranes or skin necrosis: NO Has patient had a PCN reaction that  required hospitalization No Has patient had a PCN reaction occurring within the last 10 years: No If all of the above answers are "NO", then may proceed with Cephalosporin use.   . Caffeine Other (See Comments)    Heart murmur    Medications Prior to Admission  Medication Sig Dispense Refill Last Dose  . ibuprofen (ADVIL) 200 MG tablet Take 200 mg by mouth every 6 (six) hours as needed.   Past Week at Unknown time  . ondansetron (ZOFRAN ODT) 4 MG disintegrating tablet Take 1 tablet (4 mg total) by mouth every 8 (eight) hours as needed for nausea or vomiting. 20 tablet 0 Past Month at Unknown time  . ondansetron (ZOFRAN ODT) 4 MG disintegrating tablet Take 1 tablet (4 mg total) by mouth every 8 (eight) hours as needed for nausea or vomiting. 90 tablet 0     Review of Systems  Constitutional: Negative for chills, diaphoresis, fatigue and  fever.  Eyes: Negative for visual disturbance.  Respiratory: Negative for shortness of breath.   Cardiovascular: Negative for chest pain.  Gastrointestinal: Negative for abdominal pain, constipation, diarrhea, nausea and vomiting.  Genitourinary: Positive for pelvic pain and vaginal bleeding. Negative for dysuria, flank pain, frequency, urgency and vaginal discharge.  Neurological: Negative for dizziness, weakness, light-headedness and headaches.   Physical Exam   Blood pressure 121/66, pulse 66, temperature 98.5 F (36.9 C), resp. rate 16, last menstrual period 04/23/2019, unknown if currently breastfeeding.  Patient Vitals for the past 24 hrs:  BP Temp Pulse Resp  06/26/19 1837 121/66 98.5 F (36.9 C) 66 16   Physical Exam  Constitutional: She is oriented to person, place, and time. She appears well-developed and well-nourished. No distress.  HENT:  Head: Normocephalic and atraumatic.  Respiratory: Effort normal.  GI: Soft. She exhibits no distension and no mass. There is no abdominal tenderness. There is no rebound and no guarding.  Neurological: She is alert and oriented to person, place, and time.  Skin: Skin is warm and dry. She is not diaphoretic.  Psychiatric: She has a normal mood and affect. Her behavior is normal. Judgment and thought content normal.   Results for orders placed or performed during the hospital encounter of 06/26/19 (from the past 24 hour(s))  CBC with Differential/Platelet     Status: None   Collection Time: 06/26/19  7:23 PM  Result Value Ref Range   WBC 10.4 4.0 - 10.5 K/uL   RBC 4.40 3.87 - 5.11 MIL/uL   Hemoglobin 13.3 12.0 - 15.0 g/dL   HCT 40.1 36.0 - 46.0 %   MCV 91.1 80.0 - 100.0 fL   MCH 30.2 26.0 - 34.0 pg   MCHC 33.2 30.0 - 36.0 g/dL   RDW 14.0 11.5 - 15.5 %   Platelets 375 150 - 400 K/uL   nRBC 0.0 0.0 - 0.2 %   Neutrophils Relative % 57 %   Neutro Abs 5.9 1.7 - 7.7 K/uL   Lymphocytes Relative 33 %   Lymphs Abs 3.5 0.7 - 4.0 K/uL    Monocytes Relative 8 %   Monocytes Absolute 0.8 0.1 - 1.0 K/uL   Eosinophils Relative 1 %   Eosinophils Absolute 0.1 0.0 - 0.5 K/uL   Basophils Relative 0 %   Basophils Absolute 0.0 0.0 - 0.1 K/uL   Immature Granulocytes 1 %   Abs Immature Granulocytes 0.05 0.00 - 0.07 K/uL  Urinalysis, Routine w reflex microscopic     Status: None   Collection Time: 06/26/19  7:55 PM  Result Value Ref Range   Color, Urine YELLOW YELLOW   APPearance CLEAR CLEAR   Specific Gravity, Urine 1.020 1.005 - 1.030   pH 6.0 5.0 - 8.0   Glucose, UA NEGATIVE NEGATIVE mg/dL   Hgb urine dipstick NEGATIVE NEGATIVE   Bilirubin Urine NEGATIVE NEGATIVE   Ketones, ur NEGATIVE NEGATIVE mg/dL   Protein, ur NEGATIVE NEGATIVE mg/dL   Nitrite NEGATIVE NEGATIVE   Leukocytes,Ua NEGATIVE NEGATIVE    US Ob Transvaginal  Result Date: 06/26/2019 CLINICAL DATA:  33 year old pregnant female with pelvic pain and bleeding. Assigned gestational age of [redacted] weeks 1 day by recent ultrasound. EXAM: TRANSVAGINAL OB ULTRASOUND TECHNIQUE: Transvaginal ultrasound was performed for complete evaluation of the gestation as well as the maternal uterus, adnexal regions, and pelvic cul-de-sac. COMPARISON:  06/19/2019 ultrasound FINDINGS: Intrauterine gestational sac: Single Yolk sac:  Visualized. Embryo:  Visualized. Cardiac Activity: Visualized. Heart Rate: 177 bpm CRL:   17.8 mm   8 w 1 d                  [place] EDC: 02/04/2020 Subchorionic hemorrhage:  A small subchorionic hemorrhage is noted. Maternal uterus/adnexae: The ovaries bilaterally are unremarkable. No adnexal mass or free fluid. IMPRESSION: Single living intrauterine gestation with assigned gestational age of [redacted] weeks 1 day, appropriate growth since the prior study. Small subchorionic hemorrhage. Electronically Signed   By: Margarette Canada M.D.   On: 06/26/2019 20:04   US Ob Less Than 14 Weeks With Ob Transvaginal  Result Date: 06/19/2019 CLINICAL DATA:  Pelvic pain EXAM: OBSTETRIC <14 WK [place] AND  TRANSVAGINAL OB US TECHNIQUE: Both transabdominal and transvaginal ultrasound examinations were performed for complete evaluation of the gestation as well as the maternal uterus, adnexal regions, and pelvic cul-de-sac. Transvaginal technique was performed to assess early pregnancy. COMPARISON:  None. FINDINGS: Intrauterine gestational sac: Visualized Yolk sac:  Visualized Embryo:  Visualized Cardiac Activity: Visualized Heart Rate: 143 bpm CRL:  11 mm   7 w   1 d                  [place] [facility]: February 04 2020 Subchorionic hemorrhage:  None visualized. Maternal uterus/adnexae: Uterus is retroverted. Cervical os is closed. Right ovary measures 3.4 x 2.4 x 2.0 cm. Left ovary measures 2.5 x 2.1 x 1.3 cm. No extrauterine pelvic mass or free pelvic fluid. IMPRESSION: Single live intrauterine gestation with estimated gestational age of approximately 62 weeks. No subchorionic hemorrhage evident. Uterus retroverted. No extrauterine pelvic mass or free pelvic fluid. Electronically Signed   By: Lowella Grip III M.D.   On: 06/19/2019 13:29   MAU Course  Procedures  MDM -VB in first trimester with previously established IUP -UA: WNL -CBC w/ Diff: WNL -[place]: single IUP, FHR 177, [redacted]w[redacted]d sm subchorionic hemorrhage -ABO: O Positive -Tylenol 10015mgiven for pain, pt reports pain now 0/10 -pt discharged to home in stable condition  Orders Placed This Encounter  Procedures  . [place] Transvaginal    Standing Status:   Standing    Number of Occurrences:   1    Order Specific Question:   Symptom/Reason for Exam    Answer:   Vaginal bleeding in pregnancy [[id number]]. Urinalysis, Routine w reflex microscopic    Standing Status:   Standing    Number of Occurrences:   1  . CBC with Differential/Platelet    Standing Status:   Standing    Number of Occurrences:   1  .  Discharge patient    Order Specific Question:   Discharge disposition    Answer:   01-Home or Self Care [1]    Order Specific Question:   Discharge patient  date    Answer:   06/26/2019   Meds ordered this encounter  Medications  . acetaminophen (TYLENOL) tablet 1,000 mg  . DISCONTD: cyclobenzaprine (FLEXERIL) tablet 10 mg   Assessment and Plan   1. Subchorionic hemorrhage of placenta in first trimester, single or unspecified fetus   2. Vaginal bleeding in pregnancy   3. [redacted] weeks gestation of pregnancy   4. Blood type, Rh positive    Allergies as of 06/26/2019      Reactions   Penicillins Shortness Of Breath, Palpitations   .Marland KitchenHas patient had a PCN reaction causing immediate rash, facial/tongue/throat swelling, SOB or lightheadedness with hypotension: NO Has patient had a PCN reaction causing severe rash involving mucus membranes or skin necrosis: NO Has patient had a PCN reaction that required hospitalization No Has patient had a PCN reaction occurring within the last 10 years: No If all of the above answers are "NO", then may proceed with Cephalosporin use.   Caffeine Other (See Comments)   Heart murmur      Medication List    STOP taking these medications   ibuprofen 200 MG tablet Commonly known as: ADVIL   ondansetron 4 MG disintegrating tablet Commonly known as: Zofran ODT      -discussed likelihood of bleeding after intercourse in pregnancy -discussed safe medications in pregnancy, list given -OB provider list given, pt considering termination -discussed expected s/sx with subchorionic hemorrhage -strict bleeding/pain/return MAU precautions given -pelvic rest advised -pt discharged to home in stable condition  Elmyra Ricks E Suhayla Chisom 06/26/2019, 9:04 PM

## 2019-07-26 ENCOUNTER — Ambulatory Visit
Admission: EM | Admit: 2019-07-26 | Discharge: 2019-07-26 | Disposition: A | Discharge status: Home / Self Care | Payer: Medicaid Other | Attending: Emergency Medicine | Admitting: Emergency Medicine

## 2019-07-26 ENCOUNTER — Other Ambulatory Visit: Payer: Self-pay

## 2019-07-26 ENCOUNTER — Encounter: Payer: Self-pay | Admitting: Emergency Medicine

## 2019-07-26 DIAGNOSIS — L03213 Periorbital cellulitis: Secondary | ICD-10-CM | POA: Diagnosis not present

## 2019-07-26 DIAGNOSIS — B9689 Other specified bacterial agents as the cause of diseases classified elsewhere: Secondary | ICD-10-CM

## 2019-07-26 LAB — POCT URINALYSIS DIP (MANUAL ENTRY)
Bilirubin, UA: NEGATIVE
Glucose, UA: NEGATIVE mg/dL
Ketones, POC UA: NEGATIVE mg/dL
Leukocytes, UA: NEGATIVE
Nitrite, UA: NEGATIVE
Protein Ur, POC: NEGATIVE mg/dL
Spec Grav, UA: 1.02 (ref 1.010–1.025)
Urobilinogen, UA: 0.2 E.U./dL
pH, UA: 7.5 (ref 5.0–8.0)

## 2019-07-26 MED ORDER — SULFAMETHOXAZOLE-TRIMETHOPRIM 800-160 MG PO TABS: 1.0000 | ORAL_TABLET | Freq: Two times a day (BID) | ORAL | 0 refills | Status: AC

## 2019-07-26 MED ORDER — METHYLPREDNISOLONE SODIUM SUCC 125 MG IJ SOLR
125.0000 mg | Freq: Once | INTRAMUSCULAR | Status: AC
  Administered 2019-07-26: 125 mg via INTRAMUSCULAR

## 2019-07-26 MED ORDER — AMOXICILLIN-POT CLAVULANATE 875-125 MG PO TABS: 1.0000 | ORAL_TABLET | Freq: Two times a day (BID) | ORAL | 0 refills | Status: DC

## 2019-07-26 NOTE — ED Triage Notes (Signed)
[facility] of swelling and itchiness around her eyes.  Patient states the itchiness started Friday of last week.  States she ate a lot of products with peanuts in them and is unsure if she has a new allergy.  States starting Monday she started having swelling and this morning when she woke up they were almost swollen shut.  Pt has been using her son's eczema medication, has tried Benadryl and Cetirizine, without relief.  Has tried petroleum jelly, lotion and cleaning therapy starting yesterday.

## 2019-07-26 NOTE — ED Provider Notes (Signed)
[name]    CSN: [id number] Arrival date & time: 07/26/19  1133      History   Chief Complaint Chief Complaint  Patient presents with  . Facial Swelling    HPI Morgan Newton is a 34 y.o. female with history of hypertension, asthma, anemia presenting for periorbital edema.  States she has had swelling around her eyes for the last 3 days.  Noticed a pruritic rash on Friday around her nose (L>R side).  Patient thought it may have been an allergic reaction to increased peanut consumption over the weekend, despite tolerating peanuts well up until now.  Patient took Benadryl without significant relief.  Denies lip/tongue/throat swelling, difficulty breathing, chest pain.  Patient has severe abdominal pain, fever, change in urination.  Of note, patient did undergo pharmacologic termination of pregnancy about 3 weeks ago.  Did have some blood mixed in with her urine around that time.  Unsure if she still having blood in urine: Denies vaginal bleeding, pelvic pain, abdominal pain.   Past Medical History:  Diagnosis Date  . Abnormal Pap smear 2012   colpo  . Anemia   . Asthma   . Chlamydia   . Depression   . Genital herpes   . Gonorrhea   . Heart murmur   . Hypertension    PIH  . Infection    urinary tract infection  . PID (acute pelvic inflammatory disease)   . Pregnancy induced hypertension   . Trichimoniasis     There are no problems to display for this patient.   Past Surgical History:  Procedure Laterality Date  . CESAREAN SECTION  07/22/2012   Procedure: CESAREAN SECTION;  Surgeon: Frederico Hamman, MD;  Location: [facility];  Service: Obstetrics;  Laterality: N/A;  Primary Cesarean Section   . GANGLION CYST EXCISION    . MOUTH SURGERY      OB History    Gravida  6   Para  3   Term  2   Preterm  1   AB  2   Living  2     SAB  1   TAB  1   Ectopic      Multiple      Live Births  3            Home Medications    Prior to  Admission medications   Medication Sig Start Date End Date Taking? Authorizing Provider  amoxicillin-clavulanate (AUGMENTIN) 875-125 MG tablet Take 1 tablet by mouth every 12 (twelve) hours. 07/26/19   Hall-Potvin, [place], PA-C  sulfamethoxazole-trimethoprim (BACTRIM DS) 800-160 MG tablet Take 1 tablet by mouth 2 (two) times daily for 7 days. 07/26/19 08/02/19  Hall-Potvin, [place], PA-C    Family History Family History  Problem Relation Age of Onset  . Hypertension Maternal Grandfather   . Hypertension Mother   . Thyroid disease Mother   . Hypertension Maternal Grandmother   . Thyroid disease Maternal Grandmother   . Asthma Father   . Asthma Maternal Aunt   . Asthma Paternal Grandmother   . Other Neg Hx   . Hearing loss Neg Hx     Social History Social History   Tobacco Use  . Smoking status: Current Some Day Smoker    Packs/day: 0.25    Types: Cigarettes  . Smokeless tobacco: Never Used  Substance Use Topics  . Alcohol use: Not Currently    Comment: rarely  . Drug use: No     Allergies  Penicillins and Caffeine   Review of Systems Review of Systems  Constitutional: Negative for fatigue and fever.  HENT: Positive for facial swelling. Negative for ear pain, sinus pain, sore throat and voice change.   Eyes: Negative for pain, redness and visual disturbance.  Respiratory: Negative for cough and shortness of breath.   Cardiovascular: Negative for chest pain and palpitations.  Gastrointestinal: Negative for abdominal pain, diarrhea and vomiting.  Musculoskeletal: Negative for arthralgias and myalgias.  Skin: Positive for rash. Negative for wound.  Neurological: Negative for syncope and headaches.     Physical Exam Triage Vital Signs ED Triage Vitals  Enc Vitals Group     BP      Pulse      Resp      Temp      Temp src      SpO2      Weight      Height      Head Circumference      Peak Flow      Pain Score      Pain Loc      Pain Edu?      Excl. in [place]?     No data found.  Updated Vital Signs BP 133/84 (BP Location: Right Arm)   Pulse 81   Temp 97.8 F (36.6 C) (Temporal)   Resp 18   LMP 04/23/2019   SpO2 98%   Breastfeeding Unknown   Visual Acuity Right Eye Distance:   Left Eye Distance:   Bilateral Distance:    Right Eye Near: R Near: 20/30 Left Eye Near:  L Near: 20/40 Bilateral Near:  20/30  Physical Exam Constitutional:      General: She is not in acute distress.    Appearance: She is obese. She is not toxic-appearing or diaphoretic.  HENT:     Head: Normocephalic and atraumatic.     Right Ear: Tympanic membrane, ear canal and external ear normal.     Left Ear: Tympanic membrane, ear canal and external ear normal.     Nose: Nose normal.     Mouth/Throat:     Mouth: Mucous membranes are moist.     Pharynx: Oropharynx is clear. No oropharyngeal exudate or posterior oropharyngeal erythema.     Comments: Uvula midline, nonedematous Eyes:     General: No scleral icterus.       Right eye: No discharge.        Left eye: No discharge.     Extraocular Movements: Extraocular movements intact.     Conjunctiva/sclera: Conjunctivae normal.     Pupils: Pupils are equal, round, and reactive to light.     Comments: Grossly intact.  Periorbital edema present with slight erythema.  TTP.  EOMs intact & w/o nystagmus or tenderness  Cardiovascular:     Rate and Rhythm: Normal rate and regular rhythm.     Heart sounds: No murmur. No gallop.   Pulmonary:     Effort: Pulmonary effort is normal. No respiratory distress.     Breath sounds: No stridor. No wheezing, rhonchi or rales.  Abdominal:     Palpations: Abdomen is soft.     Tenderness: There is no abdominal tenderness. There is no right CVA tenderness or left CVA tenderness.  Musculoskeletal:        General: Normal range of motion.     Cervical back: Neck supple. No tenderness.     Right lower leg: No edema.     Left lower leg: No edema.  Lymphadenopathy:     Cervical: No  cervical adenopathy.  Skin:    Capillary Refill: Capillary refill takes less than 2 seconds.     Coloration: Skin is not jaundiced or pale.     Comments: Patient with dry, cracking around nasolabial fold bilaterally (L>R).  Patient does have malodor.  Rash with underlying erythema, warmth, tenderness, though does not spare nasolabial fold.  This extends up into periorbital edema bilaterally  Neurological:     General: No focal deficit present.     Mental Status: She is alert and oriented to person, place, and time.      UC Treatments / Results  Labs (all labs ordered are listed, but only abnormal results are displayed) Labs Reviewed  POCT URINALYSIS DIP (MANUAL ENTRY) - Abnormal; Notable for the following components:      Result Value   Blood, UA trace-intact (*)    All other components within normal limits    EKG   Radiology No results found.  Procedures Procedures (including critical care time)  Medications Ordered in UC Medications  methylPREDNISolone sodium succinate (SOLU-MEDROL) 125 mg/2 mL injection 125 mg (125 mg Intramuscular Given 07/26/19 1225)    Initial Impression / Assessment and Plan / UC Course  I have reviewed the triage vital signs and the nursing notes.  Pertinent labs & imaging results that were available during my care of the patient were reviewed by me and considered in my medical decision making (see chart for details).     Patient afebrile, nontoxic in office.  Given patient reported hematuria with periorbital edema, urine dipstick done in office, reviewed by me: Significant for trace intact blood.  Low concern for nephrotic or nephritic syndrome at this time.  Low concern for allergic response as well.  Will cover for preseptal cellulitis as outlined below given patient's pain, erythema, distribution of rash and swelling.  Likely caused by nasolabial superficial skin opening.  Patient to follow-up with PCP for further evaluation of hematuria if  needed.  Return precautions discussed, patient verbalized understanding and is agreeable to plan. Final Clinical Impressions(s) / UC Diagnoses   Final diagnoses:  Preseptal cellulitis     Discharge Instructions     Take both antibiotics 2 times a day with food. Can eat yogurt or take probiotic to prevent diarrhea. Keep skin clean/dry. No hot water! Go to ER for worsening rash, change in vision, fever, difficulty breathing/swallowing, chest pain.    ED Prescriptions    Medication Sig Dispense Auth. Provider   sulfamethoxazole-trimethoprim (BACTRIM DS) 800-160 MG tablet Take 1 tablet by mouth 2 (two) times daily for 7 days. 14 tablet Hall-Potvin, [place], PA-C   amoxicillin-clavulanate (AUGMENTIN) 875-125 MG tablet Take 1 tablet by mouth every 12 (twelve) hours. 14 tablet Hall-Potvin, [place], PA-C     PDMP not reviewed this encounter.   Hall-Potvin, [place], [place] 07/26/19 1326

## 2019-07-26 NOTE — Discharge Instructions (Addendum)
Take both antibiotics 2 times a day with food. Can eat yogurt or take probiotic to prevent diarrhea. Keep skin clean/dry. No hot water! Go to ER for worsening rash, change in vision, fever, difficulty breathing/swallowing, chest pain.

## 2019-09-21 DIAGNOSIS — Z20822 Contact with and (suspected) exposure to covid-19: Secondary | ICD-10-CM | POA: Diagnosis not present

## 2019-10-19 ENCOUNTER — Ambulatory Visit (INDEPENDENT_AMBULATORY_CARE_PROVIDER_SITE_OTHER)
Admission: RE | Admit: 2019-10-19 | Discharge: 2019-10-19 | Disposition: A | Discharge status: Home / Self Care | Payer: Medicaid Other | Source: Ambulatory Visit

## 2019-10-19 DIAGNOSIS — M545 Low back pain, unspecified: Secondary | ICD-10-CM

## 2019-10-19 MED ORDER — NAPROXEN 500 MG PO TABS: 500.0000 mg | ORAL_TABLET | Freq: Two times a day (BID) | ORAL | 0 refills | Status: DC

## 2019-10-19 MED ORDER — CYCLOBENZAPRINE HCL 5 MG PO TABS: 5.0000 mg | ORAL_TABLET | Freq: Two times a day (BID) | ORAL | 0 refills | Status: DC | PRN

## 2019-10-19 NOTE — ED Provider Notes (Signed)
Virtual Visit via Video Note:  Morgan Newton  initiated request for Telemedicine visit with [facility] team. I connected with Morgan Newton  on 10/19/2019 at 11:47 AM  for a synchronized telemedicine visit using a video enabled HIPPA compliant telemedicine application. I verified that I am speaking with Morgan Newton  using two identifiers. Morgan Rawl C Scharlene Catalina, PA-C  was physically located in a [facility] care site and Morgan Newton was located at a different location.   The limitations of evaluation and management by telemedicine as well as the availability of in-person appointments were discussed. Patient was informed that she  may incur a bill ( including co-pay) for this virtual visit encounter. Morgan Newton  expressed understanding and gave verbal consent to proceed with virtual visit.     History of Present Illness:Morgan Newton  is a 34 y.o. female presents for evaluation of back pain.  Patient notes that approximately 3 days ago she woke up with back pain in her lower back.  Feels as if her back is very tight and needs to pop.  Has gradually worsened over the past couple days.  She reports a lot of pain with bending over and cannot get comfortable in most positions.  Pain with lifting left to go upstairs.  At times pain will spasm.  Symptoms are felt in bilateral lower back areas.  Denies radiation into legs.  Denies numbness or tingling.  Denies any injury or trauma, increase activity or heavy lifting.  Denies history of back problems.  Denies any urinary symptoms.  Does have a slight increase in pain with sensation to have a bowel movement, but denies difficulty controlling bowels.  Denies saddle anesthesia.  Using Bayer back and body and ibuprofen without relief.     Allergies  Allergen Reactions  . Penicillins Shortness Of Breath and Palpitations    .Marland KitchenHas patient had a PCN reaction causing immediate rash, facial/tongue/throat swelling, SOB or lightheadedness  with hypotension: NO Has patient had a PCN reaction causing severe rash involving mucus membranes or skin necrosis: NO Has patient had a PCN reaction that required hospitalization No Has patient had a PCN reaction occurring within the last 10 years: No If all of the above answers are "NO", then may proceed with Cephalosporin use.   . Caffeine Other (See Comments)    Heart murmur     Past Medical History:  Diagnosis Date  . Abnormal Pap smear 2012   colpo  . Anemia   . Asthma   . Chlamydia   . Depression   . Genital herpes   . Gonorrhea   . Heart murmur   . Hypertension    PIH  . Infection    urinary tract infection  . PID (acute pelvic inflammatory disease)   . Pregnancy induced hypertension   . Trichimoniasis      Social History   Tobacco Use  . Smoking status: Current Some Day Smoker    Packs/day: 0.25    Types: Cigarettes  . Smokeless tobacco: Never Used  Substance Use Topics  . Alcohol use: Not Currently    Comment: rarely  . Drug use: No        Observations/Objective: Physical Exam  Constitutional: She is oriented to person, place, and time and well-developed, well-nourished, and in no distress. No distress.  HENT:  Head: Normocephalic and atraumatic.  Pulmonary/Chest: Effort normal. No respiratory distress.  Speaking in full sentences  Neurological: She is alert and  oriented to person, place, and time.  Speech clear, face symmetric     Assessment and Plan:    ICD-10-CM   1. Acute bilateral low back pain without sciatica  M54.5     History suggestive of likely lumbar strain/spasming.  Does not have any radicular distribution at this time.  No injury.  No red flags for cauda equina.  Recommending continued use of NSAIDs and muscle relaxers.  Follow-up in person if above not relieving as may have option of trial of injection-Toradol versus Depo-Medrol.  Activity modification.  Discussed strict return precautions. Patient verbalized understanding and  is agreeable with plan.    Follow Up Instructions:     I discussed the assessment and treatment plan with the patient. The patient was provided an opportunity to ask questions and all were answered. The patient agreed with the plan and demonstrated an understanding of the instructions.   The patient was advised to call back or seek an in-person evaluation if the symptoms worsen or if the condition fails to improve as anticipated.      Janith Lima, PA-C  10/19/2019 11:47 AM         Janith Lima, PA-C 10/19/19 1152

## 2019-10-19 NOTE — Discharge Instructions (Signed)
Naprosyn twice daily for the next 7-10 days with food You may use flexeril as needed to help with pain. This is a muscle relaxer and causes sedation- please use only at bedtime or when you will be home and not have to drive/work Gentle stretching  Avoid heavy lifting, avoid complete bed rest  Follow up in person if not improving or worsening

## 2019-10-25 ENCOUNTER — Ambulatory Visit
Admission: EM | Admit: 2019-10-25 | Discharge: 2019-10-25 | Disposition: A | Discharge status: Home / Self Care | Payer: Medicaid Other | Attending: Emergency Medicine | Admitting: Emergency Medicine

## 2019-10-25 DIAGNOSIS — M545 Low back pain, unspecified: Secondary | ICD-10-CM

## 2019-10-25 MED ORDER — METHYLPREDNISOLONE SODIUM SUCC 125 MG IJ SOLR
125.0000 mg | Freq: Once | INTRAMUSCULAR | Status: AC
  Administered 2019-10-25: 125 mg via INTRAMUSCULAR

## 2019-10-25 NOTE — ED Triage Notes (Signed)
Pt c/o lower back pain since 03/30 with no injury. States now her lower back pain is worse, pain to sit/stand/lay. Denies pain radiating down legs but states pain to lift legs.

## 2019-10-25 NOTE — ED Provider Notes (Signed)
[name]    CSN: [id number] Arrival date & time: 10/25/19  1442      History   Chief Complaint Chief Complaint  Patient presents with  . Back Pain    HPI Morgan Newton is a 34 y.o. female with history of PAD, hypertension presenting for low back pain since 3/30.  Patient denying injury, fall, inciting event.  Patient previously evaluated for this via virtual visit on 4/2: Has been taking Flexeril every night with relief of pain, though naproxen has not been helpful.  Patient denies radiation of pain, fever, saddle area anesthesia, lower extremity numbness/weakness, urinary retention, fecal incontinence.  Purchased Bayer back and body OTC which is helped as well as lidocaine patch.    Past Medical History:  Diagnosis Date  . Abnormal Pap smear 2012   colpo  . Anemia   . Asthma   . Chlamydia   . Depression   . Genital herpes   . Gonorrhea   . Heart murmur   . Hypertension    PIH  . Infection    urinary tract infection  . PID (acute pelvic inflammatory disease)   . Pregnancy induced hypertension   . Trichimoniasis     There are no problems to display for this patient.   Past Surgical History:  Procedure Laterality Date  . CESAREAN SECTION  07/22/2012   Procedure: CESAREAN SECTION;  Surgeon: Frederico Hamman, MD;  Location: [facility];  Service: Obstetrics;  Laterality: N/A;  Primary Cesarean Section   . GANGLION CYST EXCISION    . MOUTH SURGERY      OB History    Gravida  6   Para  3   Term  2   Preterm  1   AB  2   Living  2     SAB  1   TAB  1   Ectopic      Multiple      Live Births  3            Home Medications    Prior to Admission medications   Medication Sig Start Date End Date Taking? Authorizing Provider  cyclobenzaprine (FLEXERIL) 5 MG tablet Take 1-2 tablets (5-10 mg total) by mouth 2 (two) times daily as needed for muscle spasms. 10/19/19   Wieters, Hallie C, PA-C  naproxen (NAPROSYN) 500 MG tablet Take 1  tablet (500 mg total) by mouth 2 (two) times daily. 10/19/19   Wieters, Elesa Hacker, PA-C    Family History Family History  Problem Relation Age of Onset  . Hypertension Maternal Grandfather   . Hypertension Mother   . Thyroid disease Mother   . Hypertension Maternal Grandmother   . Thyroid disease Maternal Grandmother   . Asthma Father   . Asthma Maternal Aunt   . Asthma Paternal Grandmother   . Other Neg Hx   . Hearing loss Neg Hx     Social History Social History   Tobacco Use  . Smoking status: Current Some Day Smoker    Packs/day: 0.25    Types: Cigarettes  . Smokeless tobacco: Never Used  Substance Use Topics  . Alcohol use: Not Currently    Comment: rarely  . Drug use: No     Allergies   Penicillins and Caffeine   Review of Systems As per HPI   Physical Exam Triage Vital Signs ED Triage Vitals  Enc Vitals Group     BP      Pulse  Resp      Temp      Temp src      SpO2      Weight      Height      Head Circumference      Peak Flow      Pain Score      Pain Loc      Pain Edu?      Excl. in [place]?    No data found.  Updated Vital Signs BP (!) 136/95 (BP Location: Left Arm)   Pulse 85   Temp 98.6 F (37 C) (Oral)   Resp 16   LMP 09/27/2019   SpO2 99%   Breastfeeding No   Visual Acuity Right Eye Distance:   Left Eye Distance:   Bilateral Distance:    Right Eye Near:   Left Eye Near:    Bilateral Near:     Physical Exam Constitutional:      General: She is not in acute distress. HENT:     Head: Normocephalic and atraumatic.  Eyes:     General: No scleral icterus.    Pupils: Pupils are equal, round, and reactive to light.  Cardiovascular:     Rate and Rhythm: Normal rate.  Pulmonary:     Effort: Pulmonary effort is normal.  Musculoskeletal:     Comments: Decreased lumbar ROM second to pain.  Diffuse lumbar tenderness without spinous process tenderness, PSIS tenderness.  No crepitus, edema, deformity  Skin:    Capillary  Refill: Capillary refill takes less than 2 seconds.     Coloration: Skin is not jaundiced or pale.  Neurological:     Mental Status: She is alert and oriented to person, place, and time.     Sensory: No sensory deficit.     Gait: Gait normal.     Deep Tendon Reflexes: Reflexes normal.      UC Treatments / Results  Labs (all labs ordered are listed, but only abnormal results are displayed) Labs Reviewed - No data to display  EKG   Radiology No results found.  Procedures Procedures (including critical care time)  Medications Ordered in UC Medications  methylPREDNISolone sodium succinate (SOLU-MEDROL) 125 mg/2 mL injection 125 mg (125 mg Intramuscular Given 10/25/19 1509)    Initial Impression / Assessment and Plan / UC Course  I have reviewed the triage vital signs and the nursing notes.  Pertinent labs & imaging results that were available during my care of the patient were reviewed by me and considered in my medical decision making (see chart for details).     H&P referring at this time.  Muscle relaxers providing relief: Patient does not need refill.  No red flag signs/symptoms at this time.  Patient given IM Solu-Medrol which he tolerated well.  Will follow up with Ortho for further evaluation if symptoms persist or worsen: Contact information provided.  Return precautions discussed, patient verbalized understanding and is agreeable to plan. Final Clinical Impressions(s) / UC Diagnoses   Final diagnoses:  Acute bilateral low back pain without sciatica     Discharge Instructions     You are given a shot today for pain. Please hold naproxen until tomorrow morning. May apply ice to help with swelling. Important follow-up with specialist for further evaluation for persistent or worsening symptoms. Please go to the ER if you develop fever, saddle area anesthesia, lower extremity numbness/weakness, urinary retention, fecal incontinence.    ED Prescriptions    None  I have reviewed the PDMP during this encounter.   Hall-Potvin, [place], [place] 10/25/19 1513

## 2019-10-25 NOTE — Discharge Instructions (Addendum)
You are given a shot today for pain. Please hold naproxen until tomorrow morning. May apply ice to help with swelling. Important follow-up with specialist for further evaluation for persistent or worsening symptoms. Please go to the ER if you develop fever, saddle area anesthesia, lower extremity numbness/weakness, urinary retention, fecal incontinence.

## 2019-11-01 DIAGNOSIS — M545 Low back pain: Secondary | ICD-10-CM | POA: Diagnosis not present

## 2020-04-07 ENCOUNTER — Other Ambulatory Visit: Payer: Self-pay

## 2020-04-07 ENCOUNTER — Emergency Department (HOSPITAL_COMMUNITY): Payer: Medicaid Other

## 2020-04-07 ENCOUNTER — Emergency Department (HOSPITAL_COMMUNITY)
Admission: EM | Admit: 2020-04-07 | Discharge: 2020-04-08 | Disposition: A | Discharge status: Home / Self Care | Payer: Medicaid Other | Attending: Emergency Medicine | Admitting: Emergency Medicine

## 2020-04-07 ENCOUNTER — Encounter (HOSPITAL_COMMUNITY): Payer: Self-pay | Admitting: Emergency Medicine

## 2020-04-07 DIAGNOSIS — R109 Unspecified abdominal pain: Secondary | ICD-10-CM

## 2020-04-07 DIAGNOSIS — R1011 Right upper quadrant pain: Secondary | ICD-10-CM | POA: Diagnosis not present

## 2020-04-07 DIAGNOSIS — R0789 Other chest pain: Secondary | ICD-10-CM | POA: Diagnosis not present

## 2020-04-07 DIAGNOSIS — O039 Complete or unspecified spontaneous abortion without complication: Secondary | ICD-10-CM | POA: Diagnosis not present

## 2020-04-07 DIAGNOSIS — O26891 Other specified pregnancy related conditions, first trimester: Secondary | ICD-10-CM | POA: Diagnosis not present

## 2020-04-07 DIAGNOSIS — Z711 Person with feared health complaint in whom no diagnosis is made: Secondary | ICD-10-CM

## 2020-04-07 DIAGNOSIS — J45909 Unspecified asthma, uncomplicated: Secondary | ICD-10-CM | POA: Insufficient documentation

## 2020-04-07 DIAGNOSIS — I10 Essential (primary) hypertension: Secondary | ICD-10-CM | POA: Insufficient documentation

## 2020-04-07 DIAGNOSIS — R079 Chest pain, unspecified: Secondary | ICD-10-CM | POA: Diagnosis not present

## 2020-04-07 DIAGNOSIS — F17210 Nicotine dependence, cigarettes, uncomplicated: Secondary | ICD-10-CM | POA: Insufficient documentation

## 2020-04-07 DIAGNOSIS — R000 Tachycardia, unspecified: Secondary | ICD-10-CM | POA: Diagnosis not present

## 2020-04-07 DIAGNOSIS — R110 Nausea: Secondary | ICD-10-CM | POA: Diagnosis not present

## 2020-04-07 DIAGNOSIS — M5489 Other dorsalgia: Secondary | ICD-10-CM | POA: Diagnosis not present

## 2020-04-07 DIAGNOSIS — Z3A01 Less than 8 weeks gestation of pregnancy: Secondary | ICD-10-CM | POA: Diagnosis not present

## 2020-04-07 DIAGNOSIS — Z3201 Encounter for pregnancy test, result positive: Secondary | ICD-10-CM

## 2020-04-07 DIAGNOSIS — R0602 Shortness of breath: Secondary | ICD-10-CM | POA: Diagnosis not present

## 2020-04-07 DIAGNOSIS — R7989 Other specified abnormal findings of blood chemistry: Secondary | ICD-10-CM | POA: Diagnosis not present

## 2020-04-07 LAB — HEPATIC FUNCTION PANEL
ALT: 24 U/L (ref 0–44)
AST: 23 U/L (ref 15–41)
Albumin: 3.8 g/dL (ref 3.5–5.0)
Alkaline Phosphatase: 52 U/L (ref 38–126)
Bilirubin, Direct: <0.1 mg/dL (ref 0.0–0.2)
Total Bilirubin: 0.7 mg/dL (ref 0.3–1.2)
Total Protein: 7 g/dL (ref 6.5–8.1)

## 2020-04-07 LAB — BASIC METABOLIC PANEL
Anion gap: 10 (ref 5–15)
BUN: 6 mg/dL (ref 6–20)
CO2: 21 mmol/L — ABNORMAL LOW (ref 22–32)
Calcium: 9.4 mg/dL (ref 8.9–10.3)
Chloride: 105 mmol/L (ref 98–111)
Creatinine, Ser: 0.84 mg/dL (ref 0.44–1.00)
GFR calc Af Amer: >60 mL/min (ref >60)
GFR calc non Af Amer: >60 mL/min (ref >60)
Glucose, Bld: 102 mg/dL — ABNORMAL HIGH (ref 70–99)
Potassium: 3.8 mmol/L (ref 3.5–5.1)
Sodium: 136 mmol/L (ref 135–145)

## 2020-04-07 LAB — CBC
HCT: 41.9 % (ref 36.0–46.0)
Hemoglobin: 13.7 g/dL (ref 12.0–15.0)
MCH: 29.8 pg (ref 26.0–34.0)
MCHC: 32.7 g/dL (ref 30.0–36.0)
MCV: 91.1 fL (ref 80.0–100.0)
Platelets: 366 10*3/uL (ref 150–400)
RBC: 4.6 MIL/uL (ref 3.87–5.11)
RDW: 13.9 % (ref 11.5–15.5)
WBC: 9.6 10*3/uL (ref 4.0–10.5)
nRBC: 0 % (ref 0.0–0.2)

## 2020-04-07 LAB — I-STAT BETA HCG BLOOD, ED (MC, WL, AP ONLY): I-stat hCG, quantitative: >2000 m[IU]/mL — ABNORMAL HIGH (ref <5)

## 2020-04-07 LAB — HCG, QUANTITATIVE, PREGNANCY: hCG, Beta Chain, Quant, S: 4433 m[IU]/mL — ABNORMAL HIGH (ref <5)

## 2020-04-07 LAB — D-DIMER, QUANTITATIVE: D-Dimer, Quant: 0.9 ug/mL-FEU — ABNORMAL HIGH (ref 0.00–0.50)

## 2020-04-07 LAB — LIPASE, BLOOD: Lipase: 20 U/L (ref 11–51)

## 2020-04-07 MED ORDER — METHOCARBAMOL 750 MG PO TABS: 750.0000 mg | ORAL_TABLET | Freq: Three times a day (TID) | ORAL | 0 refills | Status: DC | PRN

## 2020-04-07 MED ORDER — IOHEXOL 350 MG/ML SOLN
60.0000 mL | Freq: Once | INTRAVENOUS | Status: AC | PRN
  Administered 2020-04-07: 60 mL via INTRAVENOUS

## 2020-04-07 MED ORDER — METHOCARBAMOL 500 MG PO TABS
750.0000 mg | ORAL_TABLET | Freq: Once | ORAL | Status: AC
  Administered 2020-04-08: 750 mg via ORAL
  Filled 2020-04-07: qty 2

## 2020-04-07 MED ORDER — ACETAMINOPHEN 500 MG PO TABS
1000.0000 mg | ORAL_TABLET | Freq: Once | ORAL | Status: AC
  Administered 2020-04-08: 1000 mg via ORAL
  Filled 2020-04-07: qty 2

## 2020-04-07 NOTE — ED Provider Notes (Signed)
[facility] EMERGENCY DEPARTMENT Provider Note   CSN: [id number] Arrival date & time: 04/07/20  1723     History Chief Complaint  Patient presents with  . Chest Pain  . Flank Pain    Morgan Newton is a 34 y.o. female with a past medical history of hypertension, anemia presenting to the ED with a chief complaint of right-sided back pain and abdominal pain.  Yesterday started having sharp pain in her right mid back.  Is now radiating to her right upper quadrant.  Pain is intermittent, worse with deep inspiration.  States that she is also having some chest discomfort in association with this.  She has been having vaginal spotting for the past week.  Diagnosed with Covid on 03/24/2020.  Did not have significant cough or shortness of breath during her quarantine..  She has not taken any medications to help with her symptoms.  Denies any hemoptysis, leg swelling.  Reports tobacco use.  No history of DVT or PE, OCP use. Last period was mid July. No lower abdominal pain.  HPI     Past Medical History:  Diagnosis Date  . Abnormal Pap smear 2012   colpo  . Anemia   . Asthma   . Chlamydia   . Depression   . Genital herpes   . Gonorrhea   . Heart murmur   . Hypertension    PIH  . Infection    urinary tract infection  . PID (acute pelvic inflammatory disease)   . Pregnancy induced hypertension   . Trichimoniasis     There are no problems to display for this patient.   Past Surgical History:  Procedure Laterality Date  . CESAREAN SECTION  07/22/2012   Procedure: CESAREAN SECTION;  Surgeon: Frederico Hamman, MD;  Location: [facility];  Service: Obstetrics;  Laterality: N/A;  Primary Cesarean Section   . GANGLION CYST EXCISION    . MOUTH SURGERY       OB History    Gravida  6   Para  3   Term  2   Preterm  1   AB  2   Living  2     SAB  1   TAB  1   Ectopic      Multiple      Live Births  3           Family History  Problem Relation Age  of Onset  . Hypertension Maternal Grandfather   . Hypertension Mother   . Thyroid disease Mother   . Hypertension Maternal Grandmother   . Thyroid disease Maternal Grandmother   . Asthma Father   . Asthma Maternal Aunt   . Asthma Paternal Grandmother   . Other Neg Hx   . Hearing loss Neg Hx     Social History   Tobacco Use  . Smoking status: Current Some Day Smoker    Packs/day: 0.25    Types: Cigarettes  . Smokeless tobacco: Never Used  Vaping Use  . Vaping Use: Never used  Substance Use Topics  . Alcohol use: Not Currently    Comment: rarely  . Drug use: No    Home Medications Prior to Admission medications   Medication Sig Start Date End Date Taking? Authorizing Provider  cyclobenzaprine (FLEXERIL) 5 MG tablet Take 1-2 tablets (5-10 mg total) by mouth 2 (two) times daily as needed for muscle spasms. 10/19/19   Wieters, Hallie C, PA-C  naproxen (NAPROSYN) 500 MG tablet Take  1 tablet (500 mg total) by mouth 2 (two) times daily. 10/19/19   Wieters, Hallie C, PA-C    Allergies    Penicillins and Caffeine  Review of Systems   Review of Systems  Constitutional: Negative for appetite change, chills and fever.  HENT: Negative for ear pain, rhinorrhea, sneezing and sore throat.   Eyes: Negative for photophobia and visual disturbance.  Respiratory: Positive for shortness of breath. Negative for cough, chest tightness and wheezing.   Cardiovascular: Positive for chest pain. Negative for palpitations.  Gastrointestinal: Positive for abdominal pain. Negative for blood in stool, constipation, diarrhea, nausea and vomiting.  Genitourinary: Positive for flank pain. Negative for dysuria, hematuria and urgency.  Musculoskeletal: Negative for myalgias.  Skin: Negative for rash.  Neurological: Negative for dizziness, weakness and light-headedness.    Physical Exam Updated Vital Signs BP 117/76   Pulse 83   Temp 98.5 F (36.9 C) (Oral)   Resp 19   Ht 4' 11"  (1.499 m)   Wt 86.5  kg   LMP 04/23/2019   SpO2 100%   BMI 38.52 kg/m   Physical Exam Vitals and nursing note reviewed.  Constitutional:      General: She is not in acute distress.    Appearance: She is well-developed.  HENT:     Head: Normocephalic and atraumatic.     Nose: Nose normal.  Eyes:     General: No scleral icterus.       Left eye: No discharge.     Conjunctiva/sclera: Conjunctivae normal.  Cardiovascular:     Rate and Rhythm: Normal rate and regular rhythm.     Heart sounds: Normal heart sounds. No murmur heard.  No friction rub. No gallop.   Pulmonary:     Effort: Pulmonary effort is normal. No respiratory distress.     Breath sounds: Normal breath sounds.  Abdominal:     General: Bowel sounds are normal. There is no distension.     Palpations: Abdomen is soft.     Tenderness: There is no abdominal tenderness. There is no guarding.  Musculoskeletal:        General: Normal range of motion.     Cervical back: Normal range of motion and neck supple.  Skin:    General: Skin is warm and dry.     Findings: No rash.  Neurological:     Mental Status: She is alert.     Motor: No abnormal muscle tone.     Coordination: Coordination normal.     ED Results / Procedures / Treatments   Labs (all labs ordered are listed, but only abnormal results are displayed) Labs Reviewed  BASIC METABOLIC PANEL - Abnormal; Notable for the following components:      Result Value   CO2 21 (*)    Glucose, Bld 102 (*)    All other components within normal limits  HCG, QUANTITATIVE, PREGNANCY - Abnormal; Notable for the following components:   hCG, Beta Chain, Quant, S 4,433 (*)    All other components within normal limits  I-STAT BETA HCG BLOOD, ED ([facility], [facility], [facility] ONLY) - Abnormal; Notable for the following components:   I-stat hCG, quantitative >2,000.0 (*)    All other components within normal limits  CBC  HEPATIC FUNCTION PANEL  LIPASE, BLOOD  D-DIMER, QUANTITATIVE (NOT AT [facility])  I-STAT BETA HCG  BLOOD, ED ([facility], [facility], [facility] ONLY)    EKG EKG Interpretation  Date/Time:  Monday April 07 2020 20:11:08 EDT Ventricular Rate:  76 PR  Interval:    QRS Duration: 73 QT Interval:  355 QTC Calculation: 400 R Axis:   78 Text Interpretation: Sinus rhythm Nonspecific T wave abnormality Confirmed by Lajean Saver [phone]) on 04/07/2020 8:30:28 PM   Radiology US OB Comp < 14 Wks  Addendum Date: 04/07/2020   ADDENDUM REPORT: 04/07/2020 19:58 ADDENDUM: Discussed failed intrauterine pregnancy given embryo measures 33m with no fetal heartbeat identified. No ectopic identified. These results were called by telephone at the time of interpretation on 04/07/2020 at 7:54 pm to provider Dr. SAshok Cordia, who verbally acknowledged these results. Electronically Signed   By: MIven FinnM.D.   On: 04/07/2020 19:58   Result Date: 04/07/2020 CLINICAL DATA:  Pregnancy test positive, right upper quadrant pain, HCG greater than 2000. Last menstrual period unsure. EXAM: OBSTETRIC <14 WK [place] TRANSVAGINAL OB [place]OPPLER ULTRASOUND OF OVARIES TECHNIQUE: Both transabdominal and transvaginal ultrasound examinations were performed for complete evaluation of the gestation as well as the maternal uterus, adnexal regions, and pelvic cul-de-sac. Transvaginal technique was performed to assess early pregnancy. Color Doppler ultrasound was utilized to evaluate blood flow to the ovaries. COMPARISON:  Ultrasound Ob 06/26/2019. FINDINGS: Intrauterine gestational sac: Single Yolk sac: Visualized and measuring up to 5 mm (a yolk sac greater than 7 mm is considered enlarged). Embryo:  Visualized. Cardiac Activity: Not Visualized. CRL:   8  mm   6 w 5 d                  UKoreaEDC: 11/26/2020 Subchorionic hemorrhage:  None visualized. Maternal uterus/adnexae: Corpus luteum cyst within the right ovary. The right ovary is unremarkable. The right ovary measures 3 x 2.2 x 2.4 cm resulting in a volume of 8 mL. The left ovary is unremarkable. The left ovary  measures 1.9 x 1.6 x 1.6 cm resulting in a volume of 2.6 mL. The uterus is otherwise unremarkable. Doppler evaluation of both ovaries demonstrate vascular flow. IMPRESSION: Findings meet definitive criteria for failed pregnancy. This follows SRU consensus guidelines: Diagnostic Criteria for Nonviable Pregnancy Early in the First Trimester. NAlison StallingJ Med [phone] Recommend close clinical follow-up and serial serum beta-HCG monitoring, with repeat obstetric scan as warranted by beta-HCG levels and clinical assessment. Electronically Signed: By: MIven FinnM.D. On: 04/07/2020 19:50   UKoreaAbdomen Limited  Result Date: 04/07/2020 CLINICAL DATA:  Right upper quadrant pain, nausea EXAM: ULTRASOUND ABDOMEN LIMITED RIGHT UPPER QUADRANT COMPARISON:  08/03/2022 FINDINGS: Gallbladder: No gallstones or wall thickening visualized. No sonographic Murphy sign noted by sonographer. Common bile duct: Diameter: 5 mm Liver: No focal lesion identified. Within normal limits in parenchymal echogenicity. Portal vein is patent on color Doppler imaging with normal direction of blood flow towards the liver. Other: None. IMPRESSION: 1. Unremarkable right upper quadrant ultrasound. Electronically Signed   By: MRanda NgoM.D.   On: 04/07/2020 18:29    Procedures Procedures (including critical care time)  Medications Ordered in ED Medications - No data to display  ED Course  I have reviewed the triage vital signs and the nursing notes.  Pertinent labs & imaging results that were available during my care of the patient were reviewed by me and considered in my medical decision making (see chart for details).    MDM Rules/Calculators/A&P                          34year old female who is currently pregnant at unknown gestation presenting to the ED  with back pain, flank pain.  Right knee back pain since yesterday has progressed.  Anterior abdominal/chest pain today.  Worse with deep inspiration.  Also reporting  some chest pressure.  Covid infection with quarantine starting on the weekend of 03/24/2020.  Did not have significant cough or shortness of breath during the time of her quarantine.  No history of DVT or PE.  No leg swelling.  She is a smoker. Lungs are clear to auscultation bilaterally.  She is not tachycardic or hypoxic.  She is tachypneic.  EKG shows sinus rhythm.  I-STAT hCG elevated greater than 2000.  BMP, CBC unremarkable.  LFTs and lipase unremarkable.  Right upper quadrant ultrasound is negative for acute abnormality.  Pelvic ultrasound shows concern for failed pregnancy.  Due to her symptoms, recent Covid infection will need to obtain chest x-ray, D-dimer to r/o PE as she is at risk for this. Handed off to Dr. Ashok Cordia pending remainder of work-up and final disposition.  Portions of this note were generated with Lobbyist. Dictation errors may occur despite best attempts at proofreading.  Final Clinical Impression(s) / ED Diagnoses Final diagnoses:  Concern about miscarriage without diagnosis    Rx / DC Orders ED Discharge Orders    None       Lulu Riding 04/07/20 2113    Lajean Saver, MD 04/07/20 2134    Lajean Saver, MD 04/07/20 4503    Lajean Saver, MD 04/07/20 [phone]

## 2020-04-07 NOTE — Discharge Instructions (Addendum)
It was our pleasure to provide your ER care today - we hope that you feel better.  Take acetaminophen as need for pain. You may also try robaxin as need for muscle pain/spasm - no driving when taking.   Your ultrasound was read as being concerning for a non viable intrauterine pregnancy (embryo visualized with no heartbeat visualized).  Follow up with ob/gyn doctor in the next 1-3 days - call office tomorrow AM to arrange appointment with them.   Return to ER  if worse, new symptoms, fevers, worsening or severe abdominal pain, heavy bleeding, persistent vomiting, or other concern.

## 2020-04-07 NOTE — ED Triage Notes (Signed)
Pt brought to ED by [facility] from home for c/o right side cp and flank pain that started today, per pt she just found out that she is pregnant to weeks ago, no able to said how far ago.

## 2020-04-07 NOTE — ED Notes (Signed)
Patient transported to Ultrasound 

## 2020-04-07 NOTE — ED Notes (Signed)
Patient transported to CT 

## 2020-04-07 NOTE — ED Triage Notes (Signed)
Emergency Medicine Provider OB Triage Evaluation Note  Morgan Newton is a 34 y.o. female, [phone], at unknown gestation who presents to the emergency department with complaints of RUQ pain onset last night, worse today, constant. + home pregnancy test  Review of  Systems  Positive: RUQ pain Negative: vaginal bleeding, dc, lower abdominal pain, vomiting, fever  Physical Exam  BP (!) 143/100 (BP Location: Left Arm)   Pulse 99   Temp 98.5 F (36.9 C) (Oral)   Resp (!) 22   Ht 4' 11"  (1.499 m)   Wt 86.5 kg   LMP 04/23/2019   SpO2 100%   BMI 38.52 kg/m  General: Awake, no distress  HEENT: Atraumatic  Resp: Normal effort  Cardiac: Normal rate Abd: Nondistended, RUQ tender, no CVA tenderness MSK: Moves all extremities without difficulty Neuro: Speech clear  Medical Decision Making    6:12 PM Discussed with MAU APP, Erin, pt to stay in the ER for work up. Not having bleeding or lower abdominal pain.  Clinical Impression  No diagnosis found.     Tacy Learn, PA-C 04/07/20 1813

## 2020-04-07 NOTE — ED Triage Notes (Signed)
EMS VS HR 120,BP 154/92, 99% RA. CBG 109 no medications given by EMS

## 2020-04-08 NOTE — ED Notes (Signed)
Pt d/c by MD and given d/c instructions and follow up care, pt is out of the ed in wheel chair accompany by family

## 2020-04-10 ENCOUNTER — Ambulatory Visit (INDEPENDENT_AMBULATORY_CARE_PROVIDER_SITE_OTHER): Payer: Medicaid Other | Admitting: Lactation Services

## 2020-04-10 ENCOUNTER — Other Ambulatory Visit: Payer: Self-pay

## 2020-04-10 VITALS — BP 124/79 | HR 79 | Ht 59.0 in | Wt 195.0 lb

## 2020-04-10 DIAGNOSIS — O3680X0 Pregnancy with inconclusive fetal viability, not applicable or unspecified: Secondary | ICD-10-CM

## 2020-04-10 LAB — BETA HCG QUANT (REF LAB): hCG Quant: 2565 m[IU]/mL

## 2020-04-10 NOTE — Progress Notes (Signed)
Beta Hcg dropped to 2565. Spoke with Dr. Roselie Awkward and he would like for patient to come into the office to be seen by provider to discuss management options. Appointment scheduled 0815 on 9/27.   Patient informed of dropping Hcg levels and that this is an indication of a failed pregnancy. She was informed of appointment on 9/27 to discuss options.   Patient informed she may have cramping or bleeding and if bleeding is heavier than a period or she is in severe pain, she can go to the MAU at the [facility] for evaluation, otherwise come to appointment on Monday. Patient with no questions or concerns at this time. Patient voiced understanding.

## 2020-04-10 NOTE — Progress Notes (Signed)
Today here for stat bhcg. Denies bleeding. C/o still having same pain behind right breast to right midline under arm= 5- states was 10 in ED and prescribed muscle relaxer and tylenol which is helping decrease the pain .  Had Covid recently. Explained we will draw a stat bhcg and call her with results  And plan of care in about 2 hours and that she can leave the office after lab draw. She voices understanding. Hagen Tidd,RN

## 2020-04-14 ENCOUNTER — Ambulatory Visit (INDEPENDENT_AMBULATORY_CARE_PROVIDER_SITE_OTHER): Payer: Medicaid Other | Admitting: Clinical

## 2020-04-14 ENCOUNTER — Encounter: Payer: Self-pay | Admitting: Obstetrics & Gynecology

## 2020-04-14 ENCOUNTER — Ambulatory Visit (INDEPENDENT_AMBULATORY_CARE_PROVIDER_SITE_OTHER): Payer: Medicaid Other | Admitting: Obstetrics & Gynecology

## 2020-04-14 ENCOUNTER — Other Ambulatory Visit: Payer: Self-pay

## 2020-04-14 VITALS — BP 139/81 | HR 66 | Ht 59.0 in | Wt 196.6 lb

## 2020-04-14 DIAGNOSIS — O021 Missed abortion: Secondary | ICD-10-CM | POA: Insufficient documentation

## 2020-04-14 DIAGNOSIS — F4321 Adjustment disorder with depressed mood: Secondary | ICD-10-CM

## 2020-04-14 MED ORDER — ONDANSETRON 4 MG PO TBDP: 4.0000 mg | ORAL_TABLET | Freq: Four times a day (QID) | ORAL | 0 refills | Status: DC | PRN

## 2020-04-14 MED ORDER — ACETAMINOPHEN-CODEINE #3 300-30 MG PO TABS: 1.0000 | ORAL_TABLET | ORAL | 0 refills | Status: DC | PRN

## 2020-04-14 MED ORDER — MISOPROSTOL 200 MCG PO TABS: ORAL_TABLET | ORAL | 0 refills | Status: DC

## 2020-04-14 MED ORDER — MISOPROSTOL 200 MCG PO TABS: 800.0000 ug | ORAL_TABLET | Freq: Once | ORAL | 0 refills | Status: DC

## 2020-04-14 NOTE — BH Specialist Note (Signed)
Integrated Behavioral Health Initial Visit  MRN: [id number] Name: Morgan Newton  Number of [facility] Clinician visits:: 1/6 Session Start time: 9:32  Session End time: 9:41 Total time: 9  Type of Service: [place] Interpretor:No. Interpretor Name and Language: n/a    Warm Hand Off Completed.       SUBJECTIVE: Morgan Newton is a 34 y.o. female accompanied by n/a Patient was referred by Emeterio Reeve, MD for positive depression screen. Patient reports the following symptoms/concerns: Pt states her primary symptoms are anxiety, worry, dread, sleep difficulty, poor appetite, lack of concentration, attributed to recent miscarriage.  Duration of problem: Since recent loss  OBJECTIVE: Mood: Appropriate and Affect: Appropriate Risk of harm to self or others: No plan to harm self or others  LIFE CONTEXT: Family and Social: Pt lives with her husband and children; family is very supportive School/Work: - Self-Care: - Life Changes: Recent miscarriage   GOALS ADDRESSED: Patient will: 1. Demonstrate ability to: Begin healthy grieving over loss  INTERVENTIONS: Interventions utilized: Supportive Counseling  Standardized Assessments completed: GAD-7 and PHQ 9  ASSESSMENT: Patient currently experiencing Grief.   Patient may benefit from psychoeducation and brief therapeutic interventions regarding coping with symptoms of depression and anxiety related to grief .  PLAN: 1. Follow up with behavioral health clinician on : Two weeks 2. Behavioral recommendations:  -Continue allowing time and space to grieve as a family -Continue using sleep sounds at night for improved family sleep; consider additional apps as needed (on AVS) -Consider additional grief resources as needed 3. Referral(s): [facility] [employer] [facility] (In Clinic)  [place], [place]  Depression screen [facility] 2/9 04/14/2020  Decreased Interest 2   Down, Depressed, Hopeless 2  PHQ - 2 Score 4  Altered sleeping 3  Tired, decreased energy 2  Change in appetite 3  Feeling bad or failure about yourself  2  Trouble concentrating 3  Moving slowly or fidgety/restless 2  Suicidal thoughts 0  PHQ-9 Score 19   GAD 7 : Generalized Anxiety Score 04/14/2020  Nervous, Anxious, on Edge 3  Control/stop worrying 3  Worry too much - different things 3  Trouble relaxing 2  Restless 1  Easily annoyed or irritable 2  Afraid - awful might happen 3  Total GAD 7 Score 17

## 2020-04-14 NOTE — Patient Instructions (Signed)
Center for [facility] at [facility] [place], [place] [address] [phone] (main office) [phone] ([place] office)   Hello Amen, These are resources that have been helpful to women and families experiencing grief:   [place] virtual bereavement support group will be facilitated by pastoral care and perinatal education.  To start, this group will meet once a month. The registration location for this support group is on [facility] website > your wellbeing > classes and support groups > support groups  [facility] (Individual and group grief support)   [facility]  [phone]   Also:  www.RegulatorBlog.com.cy  www.nationalshare.org  www.missfoundation.org  www.nilmdts.org        www.stillstandingmag.com  VoipObserver.it    /Emotional Wellbeing Apps and Websites Here are a few free apps meant to help you to help yourself.  To find, try searching on the internet to see if the app is offered on Apple/Android devices. If your first choice doesn't come up on your device, the good news is that there are many choices! Play around with different apps to see which ones are helpful to you.    Calm This is an app meant to help increase calm feelings. Includes info, strategies, and tools for tracking your feelings.      Calm Harm  This app is meant to help with self-harm. Provides many 5-minute or 15-min coping strategies for doing instead of hurting yourself.       [place] is a problem-solving tool to help deal with emotions and cope with stress you encounter wherever you are.      MindShift This app can help people cope with anxiety. Rather than trying to avoid anxiety, you can make an important shift and face it.      MY3  MY3 features a support system, safety plan and resources with the goal of offering a tool to use in a time of need.       My Life My Voice  This mood journal offers a simple  solution for tracking your thoughts, feelings and moods. Animated emoticons can help identify your mood.       Relax Melodies Designed to help with sleep, on this app you can mix sounds and meditations for relaxation.      Smiling Mind Smiling Mind is meditation made easy: it's a simple tool that helps put a smile on your mind.        Stop, Breathe & Think  A friendly, simple guide for people through meditations for mindfulness and compassion.  Stop, Breathe and Think Kids Enter your current feelings and choose a mission to help you cope. Offers videos for certain moods instead of just sound recordings.       Team Orange The goal of this tool is to help teens change how they think, act, and react. This app helps you focus on your own good feelings and experiences.      The [place] Box The [place] Box (VHB) contains simple tools to help patients with coping, relaxation, distraction, and positive thinking.

## 2020-04-14 NOTE — Patient Instructions (Signed)
Miscarriage A miscarriage is the loss of an unborn baby (fetus) before the 20th week of pregnancy. Follow these instructions at home: Medicines   Take over-the-counter and prescription medicines only as told by your doctor.  If you were prescribed antibiotic medicine, take it as told by your doctor. Do not stop taking the antibiotic even if you start to feel better.  Do not take NSAIDs unless your doctor says that this is safe for you. NSAIDs include aspirin and ibuprofen. These medicines can cause bleeding. Activity  Rest as directed. Ask your doctor what activities are safe for you.  Have someone help you at home during this time. General instructions  Write down how many pads you use each day and how soaked they are.  Watch the amount of tissue or clumps of blood (blood clots) that you pass from your vagina. Save any large amounts of tissue for your doctor.  Do not use tampons, douche, or have sex until your doctor approves.  To help you and your partner with the process of grieving, talk with your doctor or seek counseling.  When you are ready, meet with your doctor to talk about steps you should take for your health. Also, talk with your doctor about steps to take to have a healthy pregnancy in the future.  Keep all follow-up visits as told by your doctor. This is important. Contact a doctor if:  You have a fever or chills.  You have vaginal discharge that smells bad.  You have more bleeding. Get help right away if:  You have very bad cramps or pain in your back or belly.  You pass clumps of blood that are walnut-sized or larger from your vagina.  You pass tissue that is walnut-sized or larger from your vagina.  You soak more than 1 regular pad in an hour.  You get light-headed or weak.  You faint (pass out).  You have feelings of sadness that do not go away, or you have thoughts of hurting yourself. Summary  A miscarriage is the loss of an unborn baby before  the 20th week of pregnancy.  Follow your doctor's instructions for home care. Keep all follow-up appointments.  To help you and your partner with the process of grieving, talk with your doctor or seek counseling. This information is not intended to replace advice given to you by your health care provider. Make sure you discuss any questions you have with your health care provider. Document Revised: 10/27/2018 Document Reviewed: 08/10/2016 Elsevier Patient Education  [place].

## 2020-04-14 NOTE — Progress Notes (Signed)
Ultrasounds Results Note  SUBJECTIVE HPI:  [name] is a 34 y.o. O1H0865 at 9 weeks by LMP who presents to the [facility] for followup ultrasound results. The patient denies abdominal pain or vaginal bleeding.  Upon review of the patient's records, patient was first seen in ED on 04/07/20 for pain.   BHCG on that day was 4433.  Ultrasound showed 6.5 week failed pregnancy.  Last seen in [facility] on 9/23. BHCG was 2565.  Only slight spotting so far.   Past Medical History:  Diagnosis Date  . Abnormal Pap smear 2012   colpo  . Anemia   . Asthma   . Chlamydia   . Depression   . Genital herpes   . Gonorrhea   . Heart murmur   . Hypertension    PIH  . Infection    urinary tract infection  . PID (acute pelvic inflammatory disease)   . Pregnancy induced hypertension   . Trichimoniasis    Past Surgical History:  Procedure Laterality Date  . CESAREAN SECTION  07/22/2012   Procedure: CESAREAN SECTION;  Surgeon: Frederico Hamman, MD;  Location: [facility];  Service: Obstetrics;  Laterality: N/A;  Primary Cesarean Section   . GANGLION CYST EXCISION    . MOUTH SURGERY     Social History   Socioeconomic History  . Marital status: Single    Spouse name: Not on file  . Number of children: Not on file  . Years of education: Not on file  . Highest education level: Not on file  Occupational History  . Not on file  Tobacco Use  . Smoking status: Current Some Day Smoker    Packs/day: 0.25    Types: Cigarettes  . Smokeless tobacco: Never Used  Vaping Use  . Vaping Use: Never used  Substance and Sexual Activity  . Alcohol use: Not Currently    Comment: rarely  . Drug use: No  . Sexual activity: Yes    Birth control/protection: None  Other Topics Concern  . Not on file  Social History Narrative  . Not on file   Social Determinants of Health   Financial Resource Strain:   . Difficulty of Paying Living Expenses: Not on file  Food Insecurity: Food Insecurity Present   . Worried About Charity fundraiser in the Last Year: Never true  . Ran Out of Food in the Last Year: Sometimes true  Transportation Needs: Unmet Transportation Needs  . Lack of Transportation (Medical): No  . Lack of Transportation (Non-Medical): Yes  Physical Activity:   . Days of Exercise per Week: Not on file  . Minutes of Exercise per Session: Not on file  Stress:   . Feeling of Stress : Not on file  Social Connections:   . Frequency of Communication with Friends and Family: Not on file  . Frequency of Social Gatherings with Friends and Family: Not on file  . Attends Religious Services: Not on file  . Active Member of Clubs or Organizations: Not on file  . Attends Archivist Meetings: Not on file  . Marital Status: Not on file  Intimate Partner Violence:   . Fear of Current or Ex-Partner: Not on file  . Emotionally Abused: Not on file  . Physically Abused: Not on file  . Sexually Abused: Not on file   Current Outpatient Medications on File Prior to Visit  Medication Sig Dispense Refill  . acetaminophen (TYLENOL) 500 MG tablet Take 500 mg by  mouth every 6 (six) hours as needed.    . methocarbamol (ROBAXIN) 750 MG tablet Take 1 tablet (750 mg total) by mouth 3 (three) times daily as needed (muscle spasm/pain). 15 tablet 0  . Aspirin-Caffeine (BAYER BACK & BODY PAIN EX ST) 500-32.5 MG TABS Take 2 tablets by mouth daily as needed (for pain). (Patient not taking: Reported on 04/14/2020)    . calcium carbonate (TUMS EX) 750 MG chewable tablet Chew 2 tablets by mouth 2 (two) times daily as needed for heartburn. (Patient not taking: Reported on 04/14/2020)    . cyclobenzaprine (FLEXERIL) 5 MG tablet Take 1-2 tablets (5-10 mg total) by mouth 2 (two) times daily as needed for muscle spasms. (Patient not taking: Reported on 04/14/2020) 24 tablet 0  . naproxen (NAPROSYN) 500 MG tablet Take 1 tablet (500 mg total) by mouth 2 (two) times daily. (Patient not taking: Reported on  04/07/2020) 30 tablet 0   No current facility-administered medications on file prior to visit.   Allergies  Allergen Reactions  . Penicillins Shortness Of Breath and Palpitations    .Marland KitchenHas patient had a PCN reaction causing immediate rash, facial/tongue/throat swelling, SOB or lightheadedness with hypotension: NO Has patient had a PCN reaction causing severe rash involving mucus membranes or skin necrosis: NO Has patient had a PCN reaction that required hospitalization No Has patient had a PCN reaction occurring within the last 10 years: No If all of the above answers are "NO", then may proceed with Cephalosporin use.   . Caffeine Other (See Comments)    Heart murmur    I have reviewed patient's Past Medical Hx, Surgical Hx, Family Hx, Social Hx, medications and allergies.   Review of Systems Review of Systems  Constitutional: Negative for fever and chills.  Gastrointestinal: Negative for nausea, vomiting, abdominal pain, diarrhea and constipation.  Genitourinary: Negative for dysuria.  Musculoskeletal: Negative for back pain.  Neurological: Negative for dizziness and weakness.    Physical Exam  BP 139/81   Pulse 66   Ht 4' 11"  (1.499 m)   Wt 196 lb 9.6 oz (89.2 kg)   LMP 04/23/2019   BMI 39.71 kg/m   GENERAL: Well-developed, well-nourished female in no acute distress.  HEENT: Normocephalic, atraumatic.   LUNGS: Effort normal ABDOMEN: soft, non-tender HEART: Regular rate  SKIN: Warm, dry and without erythema PSYCH: Normal mood and affect NEURO: Alert and oriented x 4  LAB RESULTS No results found for this or any previous visit (from the past 24 hour(s)). Results for Morgan, Newton (MRN [id number]) as of 04/14/2020 09:39  Ref. Range 07/21/2012 20:25  ABO/RH(D) Unknown O POS   CBC    Component Value Date/Time   WBC 9.6 04/07/2020 1735   RBC 4.60 04/07/2020 1735   HGB 13.7 04/07/2020 1735   HCT 41.9 04/07/2020 1735   PLT 366 04/07/2020 1735   MCV 91.1 04/07/2020  1735   MCH 29.8 04/07/2020 1735   MCHC 32.7 04/07/2020 1735   RDW 13.9 04/07/2020 1735   LYMPHSABS 3.5 06/26/2019 1923   MONOABS 0.8 06/26/2019 1923   EOSABS 0.1 06/26/2019 1923   BASOSABS 0.0 06/26/2019 1923    IMAGING DG Chest 2 View  Result Date: 04/07/2020 CLINICAL DATA:  Shortness of breath and right-sided chest pain. EXAM: CHEST - 2 VIEW COMPARISON:  Nov 25, 2014 FINDINGS: The heart size and mediastinal contours are within normal limits. Both lungs are clear. The visualized skeletal structures are unremarkable. IMPRESSION: No active cardiopulmonary disease. Electronically Signed  By: Virgina Norfolk M.D.   On: 04/07/2020 21:12   CT Angio Chest PE W/Cm &/Or Wo Cm  Result Date: 04/07/2020 CLINICAL DATA:  Right-sided back and abdominal pain, chest pain, positive D dimer EXAM: CT ANGIOGRAPHY CHEST WITH CONTRAST TECHNIQUE: Multidetector CT imaging of the chest was performed using the standard protocol during bolus administration of intravenous contrast. Multiplanar CT image reconstructions and MIPs were obtained to evaluate the vascular anatomy. CONTRAST:  47m OMNIPAQUE IOHEXOL 350 MG/ML SOLN COMPARISON:  04/07/2020, 11/25/2014 FINDINGS: Cardiovascular: This is a technically adequate evaluation of the pulmonary vasculature. No filling defects or pulmonary emboli. The heart is unremarkable without pericardial effusion. Normal appearance of the thoracic aorta. Mediastinum/Nodes: No enlarged mediastinal, hilar, or axillary lymph nodes. Thyroid gland, trachea, and esophagus demonstrate no significant findings. Lungs/Pleura: No airspace disease, effusion, or pneumothorax. Central airways are patent. Upper Abdomen: No acute abnormality. Musculoskeletal: No acute or destructive bony lesions. Reconstructed images demonstrate no additional findings. Review of the MIP images confirms the above findings. IMPRESSION: 1. No evidence of pulmonary embolus. 2. No acute intrathoracic process. Electronically  Signed   By: MRanda NgoM.D.   On: 04/07/2020 23:27   [place]OB Comp < 14 Wks  Addendum Date: 04/07/2020   ADDENDUM REPORT: 04/07/2020 19:58 ADDENDUM: Discussed failed intrauterine pregnancy given embryo measures 847mwith no fetal heartbeat identified. No ectopic identified. These results were called by telephone at the time of interpretation on 04/07/2020 at 7:54 pm to provider Dr. StAshok Cordia who verbally acknowledged these results. Electronically Signed   By: MoIven Finn.D.   On: 04/07/2020 19:58   Result Date: 04/07/2020 CLINICAL DATA:  Pregnancy test positive, right upper quadrant pain, HCG greater than 2000. Last menstrual period unsure. EXAM: OBSTETRIC <14 WK [place] TRANSVAGINAL OB [place]PLER ULTRASOUND OF OVARIES TECHNIQUE: Both transabdominal and transvaginal ultrasound examinations were performed for complete evaluation of the gestation as well as the maternal uterus, adnexal regions, and pelvic cul-de-sac. Transvaginal technique was performed to assess early pregnancy. Color Doppler ultrasound was utilized to evaluate blood flow to the ovaries. COMPARISON:  Ultrasound Ob 06/26/2019. FINDINGS: Intrauterine gestational sac: Single Yolk sac: Visualized and measuring up to 5 mm (a yolk sac greater than 7 mm is considered enlarged). Embryo:  Visualized. Cardiac Activity: Not Visualized. CRL:   8  mm   6 w 5 d                  [place]DC: 11/26/2020 Subchorionic hemorrhage:  None visualized. Maternal uterus/adnexae: Corpus luteum cyst within the right ovary. The right ovary is unremarkable. The right ovary measures 3 x 2.2 x 2.4 cm resulting in a volume of 8 mL. The left ovary is unremarkable. The left ovary measures 1.9 x 1.6 x 1.6 cm resulting in a volume of 2.6 mL. The uterus is otherwise unremarkable. Doppler evaluation of both ovaries demonstrate vascular flow. IMPRESSION: Findings meet definitive criteria for failed pregnancy. This follows SRU consensus guidelines: Diagnostic Criteria for Nonviable  Pregnancy Early in the First Trimester. N Alison Stalling Med [phone] close clinical follow-up and serial serum beta-HCG monitoring, with repeat obstetric scan as warranted by beta-HCG levels and clinical assessment. Electronically Signed: By: MoIven Finn.D. On: 04/07/2020 19:50   [place]abdomen Limited  Result Date: 04/07/2020 CLINICAL DATA:  Right upper quadrant pain, nausea EXAM: ULTRASOUND ABDOMEN LIMITED RIGHT UPPER QUADRANT COMPARISON:  08/03/2022 FINDINGS: Gallbladder: No gallstones or wall thickening visualized. No sonographic Murphy sign noted by sonographer. Common  bile duct: Diameter: 5 mm Liver: No focal lesion identified. Within normal limits in parenchymal echogenicity. Portal vein is patent on color Doppler imaging with normal direction of blood flow towards the liver. Other: None. IMPRESSION: 1. Unremarkable right upper quadrant ultrasound. Electronically Signed   By: Randa Ngo M.D.   On: 04/07/2020 18:29    ASSESSMENT 1. Missed abortion     PLAN    Early Intrauterine Pregnancy Failure Protocol X  Documented intrauterine pregnancy failure less than or equal to [redacted] weeks   gestation  X  No serious current illness  X  Baseline Hgb greater than or equal to 10g/dl  X  Patient has easily accessible transportation to the hospital  X  Clear preference  X  Practitioner/physician deems patient reliable  X  Counseling by practitioner or physician  X  Patient education by RN  X  Consent form signed       Rho-Gam given by RN if indicated  N/A O pos X  Medication dispensed  X  Cytotec 800 mcg Intravaginally and oral 400 mg split by patient at home   X   Tylenol #3 mg by mouth every 4 to 6 hours as needed - prescribed  X   Zofran 71m by mouth every 4 hours as needed for nausea - prescribed  Reviewed with pt cytotec procedure.  Pt verbalizes that she lives close to the hospital and has transportation readily available.  Pt appears reliable and verbalizes understanding  and agrees with plan of care  Discharge home in stable condition  RTC 1 week    AWoodroe Mode MD  04/14/2020  9:38 AM

## 2020-04-14 NOTE — Addendum Note (Signed)
Addended by: Langston Reusing on: 04/14/2020 02:16 PM   Modules accepted: Orders

## 2020-04-21 ENCOUNTER — Ambulatory Visit (INDEPENDENT_AMBULATORY_CARE_PROVIDER_SITE_OTHER): Payer: Medicaid Other | Admitting: Obstetrics & Gynecology

## 2020-04-21 ENCOUNTER — Other Ambulatory Visit: Payer: Self-pay

## 2020-04-21 ENCOUNTER — Encounter: Payer: Self-pay | Admitting: Obstetrics & Gynecology

## 2020-04-21 VITALS — BP 140/85 | HR 111 | Wt 199.0 lb

## 2020-04-21 DIAGNOSIS — O021 Missed abortion: Secondary | ICD-10-CM

## 2020-04-21 NOTE — Progress Notes (Signed)
Ultrasounds Results Note  SUBJECTIVE HPI:  [name] is a 34 y.o. H4T6546 at Unknown by LMP who presents to the [facility] for followup after cytotec for failed pregnancy. She has appropriate response with some bleeding clots and cramps that have resolved completely. She is undecided as to plan for contraception  Past Medical History:  Diagnosis Date  . Abnormal Pap smear 2012   colpo  . Anemia   . Asthma   . Chlamydia   . Depression   . Genital herpes   . Gonorrhea   . Heart murmur   . Hypertension    PIH  . Infection    urinary tract infection  . PID (acute pelvic inflammatory disease)   . Pregnancy induced hypertension   . Trichimoniasis    Past Surgical History:  Procedure Laterality Date  . CESAREAN SECTION  07/22/2012   Procedure: CESAREAN SECTION;  Surgeon: Frederico Hamman, MD;  Location: [facility];  Service: Obstetrics;  Laterality: N/A;  Primary Cesarean Section   . GANGLION CYST EXCISION    . MOUTH SURGERY     Social History   Socioeconomic History  . Marital status: Single    Spouse name: Not on file  . Number of children: Not on file  . Years of education: Not on file  . Highest education level: Not on file  Occupational History  . Not on file  Tobacco Use  . Smoking status: Current Some Day Smoker    Packs/day: 0.25    Types: Cigarettes  . Smokeless tobacco: Never Used  Vaping Use  . Vaping Use: Never used  Substance and Sexual Activity  . Alcohol use: Not Currently    Comment: rarely  . Drug use: No  . Sexual activity: Yes    Birth control/protection: None  Other Topics Concern  . Not on file  Social History Narrative  . Not on file   Social Determinants of Health   Financial Resource Strain:   . Difficulty of Paying Living Expenses: Not on file  Food Insecurity: Food Insecurity Present  . Worried About Charity fundraiser in the Last Year: Never true  . Ran Out of Food in the Last Year: Sometimes true   Transportation Needs: Unmet Transportation Needs  . Lack of Transportation (Medical): No  . Lack of Transportation (Non-Medical): Yes  Physical Activity:   . Days of Exercise per Week: Not on file  . Minutes of Exercise per Session: Not on file  Stress:   . Feeling of Stress : Not on file  Social Connections:   . Frequency of Communication with Friends and Family: Not on file  . Frequency of Social Gatherings with Friends and Family: Not on file  . Attends Religious Services: Not on file  . Active Member of Clubs or Organizations: Not on file  . Attends Archivist Meetings: Not on file  . Marital Status: Not on file  Intimate Partner Violence:   . Fear of Current or Ex-Partner: Not on file  . Emotionally Abused: Not on file  . Physically Abused: Not on file  . Sexually Abused: Not on file   Current Outpatient Medications on File Prior to Visit  Medication Sig Dispense Refill  . acetaminophen (TYLENOL) 500 MG tablet Take 500 mg by mouth every 6 (six) hours as needed.    Marland Kitchen acetaminophen-codeine (TYLENOL #3) 300-30 MG tablet Take 1-2 tablets by mouth every 4 (four) hours as needed for moderate pain. 10 tablet  0  . Aspirin-Caffeine (BAYER BACK & BODY PAIN EX ST) 500-32.5 MG TABS Take 2 tablets by mouth daily as needed (for pain). (Patient not taking: Reported on 04/14/2020)    . calcium carbonate (TUMS EX) 750 MG chewable tablet Chew 2 tablets by mouth 2 (two) times daily as needed for heartburn. (Patient not taking: Reported on 04/14/2020)    . cyclobenzaprine (FLEXERIL) 5 MG tablet Take 1-2 tablets (5-10 mg total) by mouth 2 (two) times daily as needed for muscle spasms. (Patient not taking: Reported on 04/14/2020) 24 tablet 0  . methocarbamol (ROBAXIN) 750 MG tablet Take 1 tablet (750 mg total) by mouth 3 (three) times daily as needed (muscle spasm/pain). 15 tablet 0  . misoprostol (CYTOTEC) 200 MCG tablet Take 2 tablets by mouth and 2 intravaginally for one dose 4 tablet 0  .  naproxen (NAPROSYN) 500 MG tablet Take 1 tablet (500 mg total) by mouth 2 (two) times daily. (Patient not taking: Reported on 04/07/2020) 30 tablet 0  . ondansetron (ZOFRAN ODT) 4 MG disintegrating tablet Take 1 tablet (4 mg total) by mouth every 6 (six) hours as needed for nausea. 4 tablet 0   No current facility-administered medications on file prior to visit.   Allergies  Allergen Reactions  . Penicillins Shortness Of Breath and Palpitations    .Marland KitchenHas patient had a PCN reaction causing immediate rash, facial/tongue/throat swelling, SOB or lightheadedness with hypotension: NO Has patient had a PCN reaction causing severe rash involving mucus membranes or skin necrosis: NO Has patient had a PCN reaction that required hospitalization No Has patient had a PCN reaction occurring within the last 10 years: No If all of the above answers are "NO", then may proceed with Cephalosporin use.   . Caffeine Other (See Comments)    Heart murmur    I have reviewed patient's Past Medical Hx, Surgical Hx, Family Hx, Social Hx, medications and allergies.   Review of Systems Review of Systems  Constitutional: Negative for fever and chills.  Gastrointestinal: Negative for nausea, vomiting, abdominal pain, diarrhea and constipation.  Genitourinary: Negative for dysuria.  Musculoskeletal: Negative for back pain.  Neurological: Negative for dizziness and weakness.    Physical Exam  BP 140/85   Pulse (!) 111   Wt 199 lb (90.3 kg)   BMI 40.19 kg/m   GENERAL: Well-developed, well-nourished female in no acute distress.  HEENT: Normocephalic, atraumatic.   LUNGS: Effort normal ABDOMEN: soft, non-tender HEART: Regular rate  SKIN: Warm, dry and without erythema PSYCH: Normal mood and affect NEURO: Alert and oriented x 4  LAB RESULTS No results found for this or any previous visit (from the past 24 hour(s)).  IMAGING DG Chest 2 View  Result Date: 04/07/2020 CLINICAL DATA:  Shortness of breath and  right-sided chest pain. EXAM: CHEST - 2 VIEW COMPARISON:  Nov 25, 2014 FINDINGS: The heart size and mediastinal contours are within normal limits. Both lungs are clear. The visualized skeletal structures are unremarkable. IMPRESSION: No active cardiopulmonary disease. Electronically Signed   By: Virgina Norfolk M.D.   On: 04/07/2020 21:12   CT Angio Chest PE W/Cm &/Or Wo Cm  Result Date: 04/07/2020 CLINICAL DATA:  Right-sided back and abdominal pain, chest pain, positive D dimer EXAM: CT ANGIOGRAPHY CHEST WITH CONTRAST TECHNIQUE: Multidetector CT imaging of the chest was performed using the standard protocol during bolus administration of intravenous contrast. Multiplanar CT image reconstructions and MIPs were obtained to evaluate the vascular anatomy. CONTRAST:  47m OMNIPAQUE IOHEXOL 350  MG/ML SOLN COMPARISON:  04/07/2020, 11/25/2014 FINDINGS: Cardiovascular: This is a technically adequate evaluation of the pulmonary vasculature. No filling defects or pulmonary emboli. The heart is unremarkable without pericardial effusion. Normal appearance of the thoracic aorta. Mediastinum/Nodes: No enlarged mediastinal, hilar, or axillary lymph nodes. Thyroid gland, trachea, and esophagus demonstrate no significant findings. Lungs/Pleura: No airspace disease, effusion, or pneumothorax. Central airways are patent. Upper Abdomen: No acute abnormality. Musculoskeletal: No acute or destructive bony lesions. Reconstructed images demonstrate no additional findings. Review of the MIP images confirms the above findings. IMPRESSION: 1. No evidence of pulmonary embolus. 2. No acute intrathoracic process. Electronically Signed   By: Randa Ngo M.D.   On: 04/07/2020 23:27   US OB Comp < 14 Wks  Addendum Date: 04/07/2020   ADDENDUM REPORT: 04/07/2020 19:58 ADDENDUM: Discussed failed intrauterine pregnancy given embryo measures 6m with no fetal heartbeat identified. No ectopic identified. These results were called by telephone  at the time of interpretation on 04/07/2020 at 7:54 pm to provider Dr. SAshok Cordia, who verbally acknowledged these results. Electronically Signed   By: MIven FinnM.D.   On: 04/07/2020 19:58   Result Date: 04/07/2020 CLINICAL DATA:  Pregnancy test positive, right upper quadrant pain, HCG greater than 2000. Last menstrual period unsure. EXAM: OBSTETRIC <14 WK [place] TRANSVAGINAL OB [place]OPPLER ULTRASOUND OF OVARIES TECHNIQUE: Both transabdominal and transvaginal ultrasound examinations were performed for complete evaluation of the gestation as well as the maternal uterus, adnexal regions, and pelvic cul-de-sac. Transvaginal technique was performed to assess early pregnancy. Color Doppler ultrasound was utilized to evaluate blood flow to the ovaries. COMPARISON:  Ultrasound Ob 06/26/2019. FINDINGS: Intrauterine gestational sac: Single Yolk sac: Visualized and measuring up to 5 mm (a yolk sac greater than 7 mm is considered enlarged). Embryo:  Visualized. Cardiac Activity: Not Visualized. CRL:   8  mm   6 w 5 d                  UKoreaEDC: 11/26/2020 Subchorionic hemorrhage:  None visualized. Maternal uterus/adnexae: Corpus luteum cyst within the right ovary. The right ovary is unremarkable. The right ovary measures 3 x 2.2 x 2.4 cm resulting in a volume of 8 mL. The left ovary is unremarkable. The left ovary measures 1.9 x 1.6 x 1.6 cm resulting in a volume of 2.6 mL. The uterus is otherwise unremarkable. Doppler evaluation of both ovaries demonstrate vascular flow. IMPRESSION: Findings meet definitive criteria for failed pregnancy. This follows SRU consensus guidelines: Diagnostic Criteria for Nonviable Pregnancy Early in the First Trimester. NAlison StallingJ Med [phone] Recommend close clinical follow-up and serial serum beta-HCG monitoring, with repeat obstetric scan as warranted by beta-HCG levels and clinical assessment. Electronically Signed: By: MIven FinnM.D. On: 04/07/2020 19:50   [place]oreaAbdomen  Limited  Result Date: 04/07/2020 CLINICAL DATA:  Right upper quadrant pain, nausea EXAM: ULTRASOUND ABDOMEN LIMITED RIGHT UPPER QUADRANT COMPARISON:  08/03/2022 FINDINGS: Gallbladder: No gallstones or wall thickening visualized. No sonographic Murphy sign noted by sonographer. Common bile duct: Diameter: 5 mm Liver: No focal lesion identified. Within normal limits in parenchymal echogenicity. Portal vein is patent on color Doppler imaging with normal direction of blood flow towards the liver. Other: None. IMPRESSION: 1. Unremarkable right upper quadrant ultrasound. Electronically Signed   By: MRanda NgoM.D.   On: 04/07/2020 18:29    ASSESSMENT Doing well after medical management of missed abortion  PLAN Discharge home in stable condition Patient advised to start/continue taking prenatal  vitamins Consider using condoms and spermacide if she does not want to conceive  Woodroe Mode, MD  04/21/2020  2:59 PM

## 2020-04-21 NOTE — Patient Instructions (Signed)
Miscarriage A miscarriage is the loss of an unborn baby (fetus) before the 20th week of pregnancy. Follow these instructions at home: Medicines   Take over-the-counter and prescription medicines only as told by your doctor.  If you were prescribed antibiotic medicine, take it as told by your doctor. Do not stop taking the antibiotic even if you start to feel better.  Do not take NSAIDs unless your doctor says that this is safe for you. NSAIDs include aspirin and ibuprofen. These medicines can cause bleeding. Activity  Rest as directed. Ask your doctor what activities are safe for you.  Have someone help you at home during this time. General instructions  Write down how many pads you use each day and how soaked they are.  Watch the amount of tissue or clumps of blood (blood clots) that you pass from your vagina. Save any large amounts of tissue for your doctor.  Do not use tampons, douche, or have sex until your doctor approves.  To help you and your partner with the process of grieving, talk with your doctor or seek counseling.  When you are ready, meet with your doctor to talk about steps you should take for your health. Also, talk with your doctor about steps to take to have a healthy pregnancy in the future.  Keep all follow-up visits as told by your doctor. This is important. Contact a doctor if:  You have a fever or chills.  You have vaginal discharge that smells bad.  You have more bleeding. Get help right away if:  You have very bad cramps or pain in your back or belly.  You pass clumps of blood that are walnut-sized or larger from your vagina.  You pass tissue that is walnut-sized or larger from your vagina.  You soak more than 1 regular pad in an hour.  You get light-headed or weak.  You faint (pass out).  You have feelings of sadness that do not go away, or you have thoughts of hurting yourself. Summary  A miscarriage is the loss of an unborn baby before  the 20th week of pregnancy.  Follow your doctor's instructions for home care. Keep all follow-up appointments.  To help you and your partner with the process of grieving, talk with your doctor or seek counseling. This information is not intended to replace advice given to you by your health care provider. Make sure you discuss any questions you have with your health care provider. Document Revised: 10/27/2018 Document Reviewed: 08/10/2016 Elsevier Patient Education  [employer].

## 2020-04-25 NOTE — BH Specialist Note (Deleted)
error 

## 2020-05-22 NOTE — Progress Notes (Signed)
Patient ID: Morgan Newton, female   DOB: 22-Sep-1985, 34 y.o.   MRN: [id number] Patient was assessed and managed by nursing staff during this encounter. I have reviewed the chart and agree with the documentation and plan. I have also made any necessary editorial changes.  Emeterio Reeve, MD 05/22/2020 11:06 AM

## 2020-06-19 ENCOUNTER — Other Ambulatory Visit: Payer: Self-pay

## 2020-06-19 ENCOUNTER — Ambulatory Visit (INDEPENDENT_AMBULATORY_CARE_PROVIDER_SITE_OTHER): Payer: Medicaid Other

## 2020-06-19 ENCOUNTER — Ambulatory Visit
Admission: EM | Admit: 2020-06-19 | Discharge: 2020-06-19 | Disposition: A | Discharge status: Home / Self Care | Payer: Medicaid Other | Attending: Emergency Medicine | Admitting: Emergency Medicine

## 2020-06-19 ENCOUNTER — Encounter: Payer: Self-pay | Admitting: Emergency Medicine

## 2020-06-19 DIAGNOSIS — M25561 Pain in right knee: Secondary | ICD-10-CM

## 2020-06-19 DIAGNOSIS — S8991XA Unspecified injury of right lower leg, initial encounter: Secondary | ICD-10-CM

## 2020-06-19 MED ORDER — IBUPROFEN 800 MG PO TABS: 800.0000 mg | ORAL_TABLET | Freq: Three times a day (TID) | ORAL | 0 refills | Status: DC

## 2020-06-19 NOTE — ED Triage Notes (Signed)
Pt said x 1 week ago she stepped down into the grass and felt something different in her right knee. Pt said she had some swelling then and she iced. Swelling went down and was feeling better. Pt said the swelling has come back and she is having a heard time with flexion. Able to walk and put pressure on it.

## 2020-06-19 NOTE — Discharge Instructions (Signed)
X-ray normal, follow-up with sports medicine for further imaging of knee- contact below May wear knee brace for comfort Ice and elevate Use anti-inflammatories for pain/swelling. You may take up to 800 mg Ibuprofen every 8 hours with food. You may supplement Ibuprofen with Tylenol [phone] mg every 8 hours.

## 2020-06-19 NOTE — ED Provider Notes (Signed)
[name]    CSN: [id number] Arrival date & time: 06/19/20  0955      History   Chief Complaint Chief Complaint  Patient presents with  . Knee Pain    HPI Morgan Newton is a 34 y.o. female history of hypertension, asthma, presenting today for evaluation of knee pain.  Reports 1 week ago stepped wrong on her knee and felt a sharp pain goes throughout her knee joint.  Since she has had intermittent swelling, sensations of popping tearing and instability.  Reports limited range of motion with flexion.  HPI  Past Medical History:  Diagnosis Date  . Abnormal Pap smear 2012   colpo  . Anemia   . Asthma   . Chlamydia   . Depression   . Genital herpes   . Gonorrhea   . Heart murmur   . Hypertension    PIH  . Infection    urinary tract infection  . PID (acute pelvic inflammatory disease)   . Pregnancy induced hypertension   . Trichimoniasis     Patient Active Problem List   Diagnosis Date Noted  . Missed abortion 04/14/2020    Past Surgical History:  Procedure Laterality Date  . CESAREAN SECTION  07/22/2012   Procedure: CESAREAN SECTION;  Surgeon: Frederico Hamman, MD;  Location: [facility];  Service: Obstetrics;  Laterality: N/A;  Primary Cesarean Section   . GANGLION CYST EXCISION    . MOUTH SURGERY      OB History    Gravida  6   Para  3   Term  2   Preterm  1   AB  2   Living  2     SAB  1   TAB  1   Ectopic      Multiple      Live Births  3            Home Medications    Prior to Admission medications   Medication Sig Start Date End Date Taking? Authorizing Provider  acetaminophen (TYLENOL) 500 MG tablet Take 500 mg by mouth every 6 (six) hours as needed.    [provider]  acetaminophen-codeine (TYLENOL #3) 300-30 MG tablet Take 1-2 tablets by mouth every 4 (four) hours as needed for moderate pain. 04/14/20   Woodroe Mode, MD  ibuprofen (ADVIL) 800 MG tablet Take 1 tablet (800 mg total) by mouth 3 (three)  times daily. 06/19/20   Abdulwahab Demelo C, PA-C  methocarbamol (ROBAXIN) 750 MG tablet Take 1 tablet (750 mg total) by mouth 3 (three) times daily as needed (muscle spasm/pain). 04/07/20   Lajean Saver, MD  misoprostol (CYTOTEC) 200 MCG tablet Take 2 tablets by mouth and 2 intravaginally for one dose 04/14/20   Woodroe Mode, MD  ondansetron (ZOFRAN ODT) 4 MG disintegrating tablet Take 1 tablet (4 mg total) by mouth every 6 (six) hours as needed for nausea. 04/14/20   Woodroe Mode, MD    Family History Family History  Problem Relation Age of Onset  . Hypertension Maternal Grandfather   . Hypertension Mother   . Thyroid disease Mother   . Hypertension Maternal Grandmother   . Thyroid disease Maternal Grandmother   . Asthma Father   . Asthma Maternal Aunt   . Asthma Paternal Grandmother   . Other Neg Hx   . Hearing loss Neg Hx     Social History Social History   Tobacco Use  . Smoking status: Current Some  Day Smoker    Packs/day: 0.25    Types: Cigarettes  . Smokeless tobacco: Never Used  Vaping Use  . Vaping Use: Never used  Substance Use Topics  . Alcohol use: Not Currently    Comment: rarely  . Drug use: No     Allergies   Penicillins and Caffeine   Review of Systems Review of Systems  Constitutional: Negative for fatigue and fever.  Eyes: Negative for visual disturbance.  Respiratory: Negative for shortness of breath.   Cardiovascular: Negative for chest pain.  Gastrointestinal: Negative for abdominal pain, nausea and vomiting.  Musculoskeletal: Positive for arthralgias, gait problem and joint swelling.  Skin: Negative for color change, rash and wound.  Neurological: Negative for dizziness, weakness, light-headedness and headaches.     Physical Exam Triage Vital Signs ED Triage Vitals  Enc Vitals Group     BP      Pulse      Resp      Temp      Temp src      SpO2      Weight      Height      Head Circumference      Peak Flow      Pain Score       Pain Loc      Pain Edu?      Excl. in [place]?    No data found.  Updated Vital Signs BP (!) 143/86 (BP Location: Left Arm)   Pulse 67   Temp 98.2 F (36.8 C) (Oral)   Resp 16   LMP 06/16/2020 (Exact Date)   SpO2 99%   Visual Acuity Right Eye Distance:   Left Eye Distance:   Bilateral Distance:    Right Eye Near:   Left Eye Near:    Bilateral Near:     Physical Exam Vitals and nursing note reviewed.  Constitutional:      Appearance: She is well-developed.     Comments: No acute distress  HENT:     Head: Normocephalic and atraumatic.     Nose: Nose normal.  Eyes:     Conjunctiva/sclera: Conjunctivae normal.  Cardiovascular:     Rate and Rhythm: Normal rate.  Pulmonary:     Effort: Pulmonary effort is normal. No respiratory distress.  Abdominal:     General: There is no distension.  Musculoskeletal:        General: Normal range of motion.     Cervical back: Neck supple.     Comments: Right knee: Mild swelling to right knee, nontender to palpation over patella, tender to palpation along medial and lateral joint lines and infrapatellar area, full extension, pain elicited with hyperextension, limited flexion beyond 60 degrees, no laxity appreciated with varus and valgus stress, negative Lachman's, negative McMurray's  Skin:    General: Skin is warm and dry.  Neurological:     Mental Status: She is alert and oriented to person, place, and time.      UC Treatments / Results  Labs (all labs ordered are listed, but only abnormal results are displayed) Labs Reviewed - No data to display  EKG   Radiology DG Knee Complete 4 Views Right  Result Date: 06/19/2020 CLINICAL DATA:  Acute right knee pain after injury last week EXAM: RIGHT KNEE - COMPLETE 4+ VIEW COMPARISON:  None. FINDINGS: No evidence of fracture, dislocation, or joint effusion. No evidence of arthropathy or other focal bone abnormality. Soft tissues are unremarkable. IMPRESSION: Negative. Electronically  Signed  By: Marijo Conception M.D.   On: 06/19/2020 11:03    Procedures Procedures (including critical care time)  Medications Ordered in UC Medications - No data to display  Initial Impression / Assessment and Plan / UC Course  I have reviewed the triage vital signs and the nursing notes.  Pertinent labs & imaging results that were available during my care of the patient were reviewed by me and considered in my medical decision making (see chart for details).     X-ray negative for acute bony abnormality, symptoms suspicious for underlying soft tissue injury of meniscal tear versus ligament injury.  Recommending further evaluation and imaging with sports medicine.  Providing knee brace for extra support, ice elevate and anti-inflammatories in the interim.  Discussed strict return precautions. Patient verbalized understanding and is agreeable with plan.  Final Clinical Impressions(s) / UC Diagnoses   Final diagnoses:  Injury of right knee, initial encounter     Discharge Instructions     X-ray normal, follow-up with sports medicine for further imaging of knee- contact below May wear knee brace for comfort Ice and elevate Use anti-inflammatories for pain/swelling. You may take up to 800 mg Ibuprofen every 8 hours with food. You may supplement Ibuprofen with Tylenol [phone] mg every 8 hours.      ED Prescriptions    Medication Sig Dispense Auth. Provider   ibuprofen (ADVIL) 800 MG tablet Take 1 tablet (800 mg total) by mouth 3 (three) times daily. 21 tablet Olar Santini, [place] C, PA-C     PDMP not reviewed this encounter.   Janith Lima, [place] 06/19/20 1112

## 2020-06-20 ENCOUNTER — Ambulatory Visit (INDEPENDENT_AMBULATORY_CARE_PROVIDER_SITE_OTHER): Payer: Medicaid Other | Admitting: Family Medicine

## 2020-06-20 DIAGNOSIS — M25561 Pain in right knee: Secondary | ICD-10-CM | POA: Diagnosis not present

## 2020-06-20 MED ORDER — MELOXICAM 15 MG PO TABS: ORAL_TABLET | ORAL | 1 refills | Status: DC

## 2020-06-20 NOTE — Assessment & Plan Note (Signed)
Given that she is somewhat getting better and is helped by the brace we will stop the ibuprofen as she has difficulty swallowing the large pill and start meloxicam once daily with food 15 mg scheduled for the next 10 days.  I will have her stay in the brace when she is active with bearing weight and have her start home exercises. -Follow-up in 4 weeks if no improvement we will order MRI for the right knee for evaluation of meniscal tear.

## 2020-06-20 NOTE — Patient Instructions (Addendum)
It was great to meet you today! Thank you for letting me participate in your care!  Today, we discussed your right knee pain which is concerning for a possible injury to the meniscus. You are hypermobile so it could also just be a knee sprain. The ultrasound did show some swelling but no obvious meniscal injury which is reassuring. I want you to take the medication I sent in a stop the Ibuprofen. Please take the Meloxicam as directed.  Please do the exercises as directed 4-5 times per week. Continue wearing the knee brace when active and elevate and ice your knee when at home at rest 3-4 times per day. I will see you back in 4 weeks.   Be well, Harolyn Rutherford, DO PGY-4, Sports Medicine Fellow [place]

## 2020-06-20 NOTE — Progress Notes (Addendum)
SUBJECTIVE:   CHIEF COMPLAINT / HPI:   Right knee pain [name] is a new patient who is a 34 year old female presenting for 1 week of right knee pain after taking an awkward step and feeling a sharp awkward twisting twinge in the right knee.  She has had pain since then and did go to urgent care and was put in a brace and given ibuprofen 800 mg and told to follow-up with [place] today.  As of today she states he does feel somewhat better but she is still tender and sore and has some pain on walking.  She has never injured her right knee before this incident and states she had no problems with it before.  Most of her pain is located on the inside of her knee toward the medial border of the patella.  She states a sensation that can be described as a catching where she feels like it is going to lock up but it does not and has not caused any falls.  She does feel like she can trust her knee and put weight on it but does notice that she uses a lot it will cause an exacerbation of the pain and discomfort.  PERTINENT  PMH / PSH: None  OBJECTIVE:   BP (!) 143/93   Ht 4' 11.5" (1.511 m)   Wt 200 lb (90.7 kg)   LMP 06/16/2020 (Exact Date)   BMI 39.72 kg/m   [place] Adult Exercise 06/20/2020  Frequency of aerobic exercise (# of days/week) 0  Average time in minutes 0  Frequency of strengthening activities (# of days/week) 0   Knee, Right: Inspection was negative for erythema, ecchymosis, and positive for very mild effusion. No obvious bony abnormalities or signs of osteophyte development. Palpation yielded no asymmetric warmth; Positive for medial and lateral joint line tenderness; No patellar tenderness; No patellar crepitus. Patellar and quadriceps tendons unremarkable, and no tenderness of the pes anserine bursa. No obvious Baker's cyst development. ROM normal in flexion (135 degrees) and extension (0 degrees). Normal hamstring and quadriceps strength. Neurovascularly intact  bilaterally. Of note she can hyperextend her other knee and states she can usually do this in the right knee. Special Tests  - Cruciate Ligaments:   - Anterior Drawer:  NEG - Posterior Drawer: NEG   - Lachman:  NEG   - Lever: NEG  - Collateral Ligaments:   - Varus/Valgus Stress test: NEG  - Meniscus:   - Thessaly: Equivical   - McMurray's: NEG  - Patella:   - Patellar grind/compression: NEG   - Patellar glide: Without apprehension  Limited MSK ultrasound: Right knee On ultrasound no suprapatellar effusion noted quad tendon intact with no disruption of the tendon fibers.  Patellar tendon intact with no disruption of the tendon fibers.  Medial meniscus was well visualized with no obvious tear.  She did have slight hyperechoic area of fluid over the medial meniscus.  Lateral meniscus well visualized and intact. Impression: Overall largely normal ultrasound of the right knee with slight effusion located over the medial meniscus.  ASSESSMENT/PLAN:   Right knee pain Given that she is somewhat getting better and is helped by the brace we will stop the ibuprofen as she has difficulty swallowing the large pill and start meloxicam once daily with food 15 mg scheduled for the next 10 days.  I will have her stay in the brace when she is active with bearing weight and have her start home exercises. -Follow-up  in 4 weeks if no improvement we will order MRI for the right knee for evaluation of meniscal tear.    Morgan Alpha, DO PGY-4, Sports Medicine Fellow [place]  I was the preceptor for this visit and available for immediate consultation Morgan Cleverly, DO

## 2020-07-23 ENCOUNTER — Ambulatory Visit: Payer: Medicaid Other | Admitting: Family Medicine

## 2020-07-24 ENCOUNTER — Ambulatory Visit
Admission: EM | Admit: 2020-07-24 | Discharge: 2020-07-24 | Disposition: A | Discharge status: Home / Self Care | Payer: Medicaid Other | Attending: Internal Medicine | Admitting: Internal Medicine

## 2020-07-24 ENCOUNTER — Other Ambulatory Visit: Payer: Self-pay

## 2020-07-24 DIAGNOSIS — L03211 Cellulitis of face: Secondary | ICD-10-CM | POA: Diagnosis not present

## 2020-07-24 MED ORDER — MUPIROCIN CALCIUM 2 % EX CREA: 1.0000 "application " | TOPICAL_CREAM | Freq: Two times a day (BID) | CUTANEOUS | 0 refills | Status: DC

## 2020-07-24 MED ORDER — SULFAMETHOXAZOLE-TRIMETHOPRIM 800-160 MG PO TABS: 1.0000 | ORAL_TABLET | Freq: Two times a day (BID) | ORAL | 0 refills | Status: AC

## 2020-07-24 NOTE — ED Triage Notes (Signed)
Patient presents to Urgent Care with complaints of itching, redness and irritation bilateral to her nose that started last night. Pt iced the area and it has improved however, last year she had the same symptoms and it progressed into orbital cellulitis requiring 2 rounds of antibiotics.

## 2020-07-24 NOTE — ED Provider Notes (Signed)
[name]    CSN: [id number] Arrival date & time: 07/24/20  0915      History   Chief Complaint Chief Complaint  Patient presents with  . Injury    Hx of orbital cellulitis     HPI Morgan Newton is a 35 y.o. female comes to the urgent care with complaints of itching, redness and irritation around her nose of 1 day duration.  Patient symptoms started yesterday and has worsened.  It is associated with redness and some excoriation.  No fever or chills.  No nausea or vomiting.  Patient has had a similar episode in the past which progressed into preseptal cellulitis.  No new cosmetic use or change in cosmetics or soaps.Marland Kitchen   HPI  Past Medical History:  Diagnosis Date  . Abnormal Pap smear 2012   colpo  . Anemia   . Asthma   . Chlamydia   . Depression   . Genital herpes   . Gonorrhea   . Heart murmur   . Hypertension    PIH  . Infection    urinary tract infection  . PID (acute pelvic inflammatory disease)   . Pregnancy induced hypertension   . Trichimoniasis     Patient Active Problem List   Diagnosis Date Noted  . Right knee pain 06/20/2020  . Missed abortion 04/14/2020    Past Surgical History:  Procedure Laterality Date  . CESAREAN SECTION  07/22/2012   Procedure: CESAREAN SECTION;  Surgeon: Frederico Hamman, MD;  Location: [facility];  Service: Obstetrics;  Laterality: N/A;  Primary Cesarean Section   . GANGLION CYST EXCISION    . MOUTH SURGERY      OB History    Gravida  6   Para  3   Term  2   Preterm  1   AB  2   Living  2     SAB  1   IAB  1   Ectopic      Multiple      Live Births  3            Home Medications    Prior to Admission medications   Medication Sig Start Date End Date Taking? Authorizing Provider  mupirocin cream (BACTROBAN) 2 % Apply 1 application topically 2 (two) times daily. 07/24/20  Yes Shaday Rayborn, Myrene Galas, MD  sulfamethoxazole-trimethoprim (BACTRIM DS) 800-160 MG tablet Take 1 tablet by mouth 2  (two) times daily for 7 days. 07/24/20 07/31/20 Yes Jyren Cerasoli, Myrene Galas, MD  acetaminophen (TYLENOL) 500 MG tablet Take 500 mg by mouth every 6 (six) hours as needed.    [provider]  misoprostol (CYTOTEC) 200 MCG tablet Take 2 tablets by mouth and 2 intravaginally for one dose 04/14/20 07/24/20  Woodroe Mode, MD    Family History Family History  Problem Relation Age of Onset  . Hypertension Maternal Grandfather   . Hypertension Mother   . Thyroid disease Mother   . Hypertension Maternal Grandmother   . Thyroid disease Maternal Grandmother   . Asthma Father   . Asthma Maternal Aunt   . Asthma Paternal Grandmother   . Other Neg Hx   . Hearing loss Neg Hx     Social History Social History   Tobacco Use  . Smoking status: Current Some Day Smoker    Packs/day: 0.25    Types: Cigarettes  . Smokeless tobacco: Never Used  Vaping Use  . Vaping Use: Never used  Substance Use Topics  .  Alcohol use: Not Currently    Comment: rarely  . Drug use: No     Allergies   Penicillins and Caffeine   Review of Systems Review of Systems  HENT: Positive for facial swelling. Negative for ear pain, mouth sores, nosebleeds, sinus pressure and sore throat.   Skin: Positive for color change and rash. Negative for wound.     Physical Exam Triage Vital Signs ED Triage Vitals  Enc Vitals Group     BP 07/24/20 1029 117/80     Pulse Rate 07/24/20 1029 70     Resp 07/24/20 1029 17     Temp 07/24/20 1029 98 F (36.7 C)     Temp Source 07/24/20 1029 Oral     SpO2 07/24/20 1029 99 %     Weight 07/24/20 1025 193 lb (87.5 kg)     Height 07/24/20 1025 4' 11"  (1.499 m)     Head Circumference --      Peak Flow --      Pain Score 07/24/20 1030 0     Pain Loc --      Pain Edu? --      Excl. in [place]? --    No data found.  Updated Vital Signs BP 117/80 (BP Location: Left Arm)   Pulse 70   Temp 98 F (36.7 C) (Oral)   Resp 17   Ht 4' 11"  (1.499 m)   Wt 87.5 kg   LMP 07/08/2020    SpO2 99%   BMI 38.98 kg/m   Visual Acuity Right Eye Distance:   Left Eye Distance:   Bilateral Distance:    Right Eye Near:   Left Eye Near:    Bilateral Near:     Physical Exam Vitals and nursing note reviewed.  Constitutional:      General: She is not in acute distress.    Appearance: Normal appearance. She is not ill-appearing.  Cardiovascular:     Rate and Rhythm: Normal rate and regular rhythm.  Musculoskeletal:        General: Normal range of motion.  Skin:    General: Skin is warm.     Findings: Erythema and rash present.     Comments: Erythematous swelling with rash around the nasolabial folds extending to the lower eyelid bilaterally.  No significant periorbital swelling  Neurological:     Mental Status: She is alert.      UC Treatments / Results  Labs (all labs ordered are listed, but only abnormal results are displayed) Labs Reviewed - No data to display  EKG   Radiology No results found.  Procedures Procedures (including critical care time)  Medications Ordered in UC Medications - No data to display  Initial Impression / Assessment and Plan / UC Course  I have reviewed the triage vital signs and the nursing notes.  Pertinent labs & imaging results that were available during my care of the patient were reviewed by me and considered in my medical decision making (see chart for details).     1.  Facial cellulitis: Bactrim double strength twice daily for 1 week Mupirocin cream twice daily If symptoms worsen please return to urgent care to be reevaluated. Final Clinical Impressions(s) / UC Diagnoses   Final diagnoses:  Facial cellulitis     Discharge Instructions     Take medications as directed Take Tylenol as needed for pain If symptoms worsen please return to the urgent care to be reevaluated If you have pain with eye movement please  go to the emergency department immediately    ED Prescriptions    Medication Sig Dispense Auth.  Provider   sulfamethoxazole-trimethoprim (BACTRIM DS) 800-160 MG tablet Take 1 tablet by mouth 2 (two) times daily for 7 days. 14 tablet Braedyn Kauk, Myrene Galas, MD   mupirocin cream (BACTROBAN) 2 % Apply 1 application topically 2 (two) times daily. 15 g Marylynne Keelin, Myrene Galas, MD     PDMP not reviewed this encounter.   Chase Picket, MD 07/24/20 [phone]

## 2020-07-24 NOTE — Discharge Instructions (Addendum)
Take medications as directed Take Tylenol as needed for pain If symptoms worsen please return to the urgent care to be reevaluated If you have pain with eye movement please go to the emergency department immediately

## 2020-07-30 ENCOUNTER — Ambulatory Visit: Payer: Medicaid Other | Admitting: Family Medicine

## 2020-08-06 ENCOUNTER — Ambulatory Visit: Payer: Medicaid Other | Admitting: Family Medicine

## 2020-08-13 ENCOUNTER — Ambulatory Visit: Payer: Medicaid Other | Admitting: Family Medicine

## 2020-08-20 ENCOUNTER — Other Ambulatory Visit: Payer: Self-pay

## 2020-08-20 ENCOUNTER — Ambulatory Visit
Admission: RE | Admit: 2020-08-20 | Discharge: 2020-08-20 | Disposition: A | Discharge status: Home / Self Care | Payer: Medicaid Other | Source: Ambulatory Visit | Attending: Urgent Care | Admitting: Urgent Care

## 2020-08-20 VITALS — BP 130/81 | HR 63 | Temp 98.4°F | Resp 20

## 2020-08-20 DIAGNOSIS — H6982 Other specified disorders of Eustachian tube, left ear: Secondary | ICD-10-CM | POA: Diagnosis not present

## 2020-08-20 DIAGNOSIS — M542 Cervicalgia: Secondary | ICD-10-CM

## 2020-08-20 DIAGNOSIS — H9202 Otalgia, left ear: Secondary | ICD-10-CM

## 2020-08-20 MED ORDER — CETIRIZINE HCL 10 MG PO TABS: 10.0000 mg | ORAL_TABLET | Freq: Every day | ORAL | 0 refills | Status: DC

## 2020-08-20 MED ORDER — FLUTICASONE PROPIONATE 50 MCG/ACT NA SUSP: 2.0000 | Freq: Every day | NASAL | 0 refills | Status: DC

## 2020-08-20 MED ORDER — PSEUDOEPHEDRINE HCL 60 MG PO TABS: 60.0000 mg | ORAL_TABLET | Freq: Three times a day (TID) | ORAL | 0 refills | Status: DC | PRN

## 2020-08-20 NOTE — ED Provider Notes (Signed)
[place]   MRN: [id number] DOB: April 16, 1986  Subjective:   Morgan Newton is a 35 y.o. female presenting for 3-day history of acute onset persistent left-sided ear pain, tinnitus, neck pain.  Denies fever, runny or stuffy nose, sore throat, cough, chest pain, shortness of breath, diaphoresis, abdominal pain.  Denies history of heart disease, heart problems.  Family history is negative for MI, heart disease.  She has a history of hypertension but is not on any medication for this now.  Patient smokes occasionally.  No current facility-administered medications for this encounter.  Current Outpatient Medications:  .  acetaminophen (TYLENOL) 500 MG tablet, Take 500 mg by mouth every 6 (six) hours as needed., Disp: , Rfl:  .  mupirocin cream (BACTROBAN) 2 %, Apply 1 application topically 2 (two) times daily., Disp: 15 g, Rfl: 0   Allergies  Allergen Reactions  . Penicillins Shortness Of Breath and Palpitations    .Marland KitchenHas patient had a PCN reaction causing immediate rash, facial/tongue/throat swelling, SOB or lightheadedness with hypotension: NO Has patient had a PCN reaction causing severe rash involving mucus membranes or skin necrosis: NO Has patient had a PCN reaction that required hospitalization No Has patient had a PCN reaction occurring within the last 10 years: No If all of the above answers are "NO", then may proceed with Cephalosporin use.   . Caffeine Other (See Comments)    Heart murmur    Past Medical History:  Diagnosis Date  . Abnormal Pap smear 2012   colpo  . Anemia   . Asthma   . Chlamydia   . Depression   . Genital herpes   . Gonorrhea   . Heart murmur   . Hypertension    PIH  . Infection    urinary tract infection  . PID (acute pelvic inflammatory disease)   . Pregnancy induced hypertension   . Trichimoniasis      Past Surgical History:  Procedure Laterality Date  . CESAREAN SECTION  07/22/2012   Procedure: CESAREAN SECTION;  Surgeon:  Frederico Hamman, MD;  Location: [facility];  Service: Obstetrics;  Laterality: N/A;  Primary Cesarean Section   . GANGLION CYST EXCISION    . MOUTH SURGERY      Family History  Problem Relation Age of Onset  . Hypertension Maternal Grandfather   . Hypertension Mother   . Thyroid disease Mother   . Hypertension Maternal Grandmother   . Thyroid disease Maternal Grandmother   . Asthma Father   . Asthma Maternal Aunt   . Asthma Paternal Grandmother   . Other Neg Hx   . Hearing loss Neg Hx     Social History   Tobacco Use  . Smoking status: Current Some Day Smoker    Packs/day: 0.25    Types: Cigarettes  . Smokeless tobacco: Never Used  Vaping Use  . Vaping Use: Never used  Substance Use Topics  . Alcohol use: Not Currently    Comment: rarely  . Drug use: No    ROS   Objective:   Vitals: BP 130/81 (BP Location: Right Arm)   Pulse 63   Temp 98.4 F (36.9 C) (Oral)   Resp 20   LMP 08/11/2020 (Exact Date)   SpO2 99%   Physical Exam Constitutional:      General: She is not in acute distress.    Appearance: Normal appearance. She is well-developed. She is not ill-appearing, toxic-appearing or diaphoretic.  HENT:     Head: Normocephalic and  atraumatic.     Right Ear: Tympanic membrane, ear canal and external ear normal. No drainage or tenderness. No middle ear effusion. There is no impacted cerumen. Tympanic membrane is not erythematous.     Left Ear: Tympanic membrane, ear canal and external ear normal. No drainage or tenderness.  No middle ear effusion. There is no impacted cerumen. Tympanic membrane is not erythematous.     Nose: Nose normal. No congestion or rhinorrhea.     Mouth/Throat:     Mouth: Mucous membranes are moist. No oral lesions.     Pharynx: Oropharynx is clear. No pharyngeal swelling, oropharyngeal exudate, posterior oropharyngeal erythema or uvula swelling.     Tonsils: No tonsillar exudate or tonsillar abscesses.  Eyes:     Extraocular Movements:  Extraocular movements intact.     Right eye: Normal extraocular motion.     Left eye: Normal extraocular motion.     Conjunctiva/sclera: Conjunctivae normal.     Pupils: Pupils are equal, round, and reactive to light.  Cardiovascular:     Rate and Rhythm: Normal rate and regular rhythm.     Pulses: Normal pulses.     Heart sounds: Normal heart sounds. No murmur heard. No friction rub. No gallop.   Pulmonary:     Effort: Pulmonary effort is normal. No respiratory distress.     Breath sounds: Normal breath sounds. No stridor. No wheezing, rhonchi or rales.  Musculoskeletal:     Cervical back: Normal range of motion and neck supple.  Lymphadenopathy:     Head:     Right side of head: No submental, submandibular, tonsillar, preauricular, posterior auricular or occipital adenopathy.     Left side of head: No submental, submandibular, tonsillar, preauricular, posterior auricular or occipital adenopathy.     Cervical: Cervical adenopathy present.     Right cervical: No superficial, deep or posterior cervical adenopathy.    Left cervical: Superficial cervical adenopathy (mid) present. No deep or posterior cervical adenopathy.  Skin:    General: Skin is warm and dry.     Findings: No rash.  Neurological:     General: No focal deficit present.     Mental Status: She is alert and oriented to person, place, and time.  Psychiatric:        Mood and Affect: Mood normal.        Behavior: Behavior normal.        Thought Content: Thought content normal.      Assessment and Plan :   PDMP not reviewed this encounter.  1. Eustachian tube dysfunction, left   2. Left ear pain   3. Neck pain on left side     Patient has minimal risk factors for ACS.  Discussed this with patient and she declined cardiac work-up.  Suspect eustachian tube dysfunction, recommended Flonase, Zyrtec, Sudafed.  Declined COVID 19 testing. Counseled patient on potential for adverse effects with medications  prescribed/recommended today, ER and return-to-clinic precautions discussed, patient verbalized understanding.    Jaynee Eagles, PA-C 08/20/20 1046

## 2020-08-20 NOTE — ED Triage Notes (Signed)
Patient states she began having neck pain x 3 days ago that has radiated down into her left shoulder and left upper back pain. Pt also developed left ear pain x 2 days ago. Pt is aox4 and ambulatory.

## 2020-09-08 ENCOUNTER — Ambulatory Visit (INDEPENDENT_AMBULATORY_CARE_PROVIDER_SITE_OTHER): Payer: Medicaid Other | Admitting: Family

## 2020-09-08 ENCOUNTER — Other Ambulatory Visit: Payer: Self-pay

## 2020-09-08 ENCOUNTER — Encounter: Payer: Self-pay | Admitting: Family

## 2020-09-08 VITALS — BP 108/78 | HR 70 | Ht 59.92 in | Wt 198.8 lb

## 2020-09-08 DIAGNOSIS — G8929 Other chronic pain: Secondary | ICD-10-CM

## 2020-09-08 DIAGNOSIS — J45990 Exercise induced bronchospasm: Secondary | ICD-10-CM | POA: Diagnosis not present

## 2020-09-08 DIAGNOSIS — M25561 Pain in right knee: Secondary | ICD-10-CM

## 2020-09-08 DIAGNOSIS — K51919 Ulcerative colitis, unspecified with unspecified complications: Secondary | ICD-10-CM

## 2020-09-08 DIAGNOSIS — Z7689 Persons encountering health services in other specified circumstances: Secondary | ICD-10-CM

## 2020-09-08 MED ORDER — MELOXICAM 7.5 MG PO TABS: 7.5000 mg | ORAL_TABLET | Freq: Every day | ORAL | 0 refills | Status: DC

## 2020-09-08 MED ORDER — ALBUTEROL SULFATE HFA 108 (90 BASE) MCG/ACT IN AERS: INHALATION_SPRAY | RESPIRATORY_TRACT | 0 refills | Status: DC

## 2020-09-08 NOTE — Progress Notes (Signed)
Establish care Experiencing joint muscle pain in last few months

## 2020-09-08 NOTE — Progress Notes (Signed)
Subjective:    Morgan Newton - 35 y.o. female MRN [id number]  Date of birth: 1986/02/16  HPI  Morgan Newton is to establish care. Patient has a PMH significant for right knee.    Current issues and/or concerns:  ROS per HPI  1. JOINT PAIN:  Reports generalized joint pain located at bilateral knees (right worse than left), back, and wrists for at least 1 year. Back pain initially lasted for 3 months and then spontaneously resolved however, still comes and goes on occasion. Bilateral knee trauma about 15 years ago related to falling after she slipped in the rain, did not receive medical attention for that at the time. Says sometimes when rain is expected in the forecast she will get some knee pain. Recently had right knee sharp pain after walking. Went to Sports Medicine at that time and it was considered that the pain may be related to the meniscus. Was told to wear a knee brace to see if this would help improve before moving to the next step and to follow-up. States she has not followed-up as of yet because her right knee has improved since wearing the brace. States if she goes a couple days without the knee brace the pain worsens.   2. ULCERATIVE COLITIS: Initially thought to be related to her emergency C-section 8 years ago of which later she developed irritable bowel syndrome. It was later determined that it was ulcerative colitis. Says she is exercising more, drinking more water, has changed her eating habits eating mostly leans meats such as chicken and fish, restricting sugar/processed foods, and using probiotics. Denies blood in the stool. Does have darker stool sometimes, last time being 3 weeks ago, but states somemtimes hard to differentiate between a dark brown versus black stool. Denies nausea and vomiting. Does have stomach pain sometimes. Has not been seen by a Gastroenterologist in the past.   3. EXERCISE-INDUCED ASTHMA: History of exercise-induced asthma years ago and taking  Albuterol inhaler at that time. Reports recently since beginning to exercise says she has difficulty catching breath after physical activity and can feel tightness in her chest. Would like to give Albuterol inhaler trial.   Health Maintenance:  Health Maintenance Due  Topic Date Due  . Hepatitis C Screening  Never done  . COVID-19 Vaccine (1) Never done  . TETANUS/TDAP  Never done  . PAP SMEAR-Modifier  03/16/2014  . INFLUENZA VACCINE  Never done     Past Medical History: Patient Active Problem List   Diagnosis Date Noted  . Right knee pain 06/20/2020  . Missed abortion 04/14/2020    Social History   reports that she has been smoking cigarettes. She has been smoking about 0.25 packs per day. She has never used smokeless tobacco. She reports previous alcohol use. She reports that she does not use drugs.   Family History  family history includes Asthma in her father, maternal aunt, and paternal grandmother; Hypertension in her maternal grandfather, maternal grandmother, and mother; Thyroid disease in her maternal grandmother and mother.   Medications: reviewed and updated   Objective:   Physical Exam LMP 08/11/2020 (Exact Date)    Vitals with BMI 09/08/2020 08/20/2020 07/24/2020  Height 4' 11.921" - 4' 11"   Weight 198 lbs 13 oz - 193 lbs  BMI 97.67 - 34.19  Systolic 379 024 097  Diastolic 78 81 80  Pulse 70 63 70    Physical Exam Constitutional:      Appearance: She is obese.  HENT:  Head: Normocephalic and atraumatic.  Eyes:     Extraocular Movements: Extraocular movements intact.     Pupils: Pupils are equal, round, and reactive to light.  Cardiovascular:     Rate and Rhythm: Normal rate and regular rhythm.     Pulses: Normal pulses.  Pulmonary:     Effort: Pulmonary effort is normal.     Breath sounds: Normal breath sounds.  Musculoskeletal:     Cervical back: Normal range of motion and neck supple.  Neurological:     General: No focal deficit present.      Mental Status: She is alert and oriented to person, place, and time.  Psychiatric:        Mood and Affect: Mood normal.        Behavior: Behavior normal.       Assessment & Plan:  1. Encounter to establish care: - Patient presents today to establish care.  - Return for annual physical examination, labs, and health maintenance. Arrive fasting meaning having had no food and/or nothing to drink for at least 8 hours prior to appointment.  2. Chronic pain of right knee: - Stable. - Continue to wear knee brace as tolerated.  - Meloxicam as prescribed. Take with food and plenty of water. Do not take any other products containing Ibuprofen, Naprosyn and Aspirin.  - Follow-up with primary provider as needed.  - meloxicam (MOBIC) 7.5 MG tablet; Take 1 tablet (7.5 mg total) by mouth daily.  Dispense: 60 tablet; Refill: 0  3. Exercise-induced asthma: - Albuterol inhaler as prescribed.  - Follow-up with primary provider as needed.  - albuterol (VENTOLIN HFA) 108 (90 Base) MCG/ACT inhaler; Two (2) inhalations five to twenty (5 to 20) minutes prior to exercise.  Dispense: 8 g; Refill: 0  4. Ulcerative colitis with complication, unspecified location [facility]): - Stable.  - Not seeing a Gastroenterologist currently or in the past.  - Follow-up with primary provider as needed.    Patient was given clear instructions to go to Emergency Department or return to medical center if symptoms don't improve, worsen, or new problems develop.The patient verbalized understanding.  I discussed the assessment and treatment plan with the patient. The patient was provided an opportunity to ask questions and all were answered. The patient agreed with the plan and demonstrated an understanding of the instructions.   The patient was advised to call back or seek an in-person evaluation if the symptoms worsen or if the condition fails to improve as anticipated.    Durene Fruits, NP 09/08/2020, 8:15 AM Primary Care at  [facility]

## 2020-09-08 NOTE — Patient Instructions (Signed)
Return for annual physical examination, labs, and health maintenance. Arrive fasting meaning having had no food and/or nothing to drink for at least 8 hours prior to appointment.  Meloxicam for knee pain.   Albuterol inhaler for exercise-induced inhaler. Thank you for choosing Primary Care at [facility] for your medical home!    Morgan Newton was seen by Camillia Herter, NP today.   Kimber Relic Cryderman's primary care provider is Camillia Herter, NP.   For the best care possible,  you should try to see Durene Fruits, NP whenever you come to clinic.   We look forward to seeing you again soon!  If you have any questions about your visit today,  please call us at [phone]  Or feel free to reach your provider via [facility].     Chronic Knee Pain, Adult Knee pain that lasts longer than 3 months is called chronic knee pain. You may have pain in one or both knees. Symptoms of chronic knee pain may also include swelling and stiffness. The most common cause is age-related wear and tear (osteoarthritis) of your knee joint. Many conditions can cause chronic knee pain. Treatment depends on the cause. The main treatments are physical therapy and weight loss. It may also be treated with medicines, injections, a knee sleeve or brace, and by using crutches. Rest, ice, pressure (compression), and elevation, also known as RICE therapy, may also be recommended. Follow these instructions at home: If you have a knee sleeve or brace:  Wear the knee sleeve or brace as told by your doctor. Take it off only as told by your doctor.  Loosen it if your toes: ? Tingle. ? Become numb. ? Turn cold and blue.  Keep it clean.  If the sleeve or brace is not waterproof: ? Do not let it get wet. ? Ask your doctor if you may take it off when you take a bath or shower. If not, cover it with a watertight covering.   Managing pain, stiffness, and swelling  If told, put heat on your knee. Do this as often as told  by your doctor. Use the heat source that your doctor recommends, such as a moist heat pack or a heating pad. ? If you have a removable knee sleeve or brace, take it off as told by your doctor. ? Place a towel between your skin and the heat source. ? Leave the heat on for 20-30 minutes. ? Take off the heat if your skin turns bright red. This is very important. If you cannot feel pain, heat, or cold, you have a greater risk of getting burned.  If told, put ice on your knee. To do this: ? If you have a removable knee sleeve or brace, take it off as told by your doctor. ? Put ice in a plastic bag. ? Place a towel between your skin and the bag. ? Leave the ice on for 20 minutes, 2-3 times a day. ? Take off the ice if your skin turns bright red. This is very important. If you cannot feel pain, heat, or cold, you have a greater risk of damage to the area.  Move your toes often.  Raise the injured area above the level of your heart while you are sitting or lying down.      Activity  Avoid activities where both feet leave the ground at the same time (high-impact activities). Examples are running, jumping rope, and doing jumping jacks.  Follow the  exercise plan that your doctor makes for you. Your doctor may suggest that you: ? Avoid activities that make knee pain worse. You may need to change the exercises that you do, the sports that you participate in, or your job duties. ? Wear shoes with cushioned soles. ? Avoid sports that require running and sudden changes in direction. ? Do exercises or physical therapy. This is planned to match your needs and your abilities. ? Do exercises that increase your balance and strength, such as tai chi and yoga.  Do not use your injured knee to support your body weight until your doctor says that you can. Use crutches as told by your doctor.  Return to your normal activities when your doctor says that it is safe. General instructions  Take over-the-counter  and prescription medicines only as told by your doctor.  If you are overweight, work with your doctor and a food expert (dietitian) to set goals to lose weight. Being overweight can make your knee hurt more.  Do not smoke or use any products that contain nicotine or tobacco. If you need help quitting, ask your doctor.  Keep all follow-up visits. Contact a doctor if:  You have knee pain that is not getting better or gets worse.  You are not able to do your exercises due to knee pain. Get help right away if:  Your knee swells and the swelling gets worse.  You cannot move your knee.  You have very bad knee pain. Summary  Knee pain that lasts more than 3 months is called chronic knee pain.  The main treatments for chronic knee pain are physical therapy and weight loss. You may also need to take medicines, wear a knee sleeve or brace, use crutches, and put ice or heat on your knee.  Lose weight if you are overweight. Work with your doctor and a food expert (dietitian) to help you set goals to lose weight. Being overweight can make your knee hurt more.  Follow the exercise plan that your doctor makes for you. This information is not intended to replace advice given to you by your health care provider. Make sure you discuss any questions you have with your health care provider. Document Revised: 12/19/2019 Document Reviewed: 12/19/2019 Elsevier Patient Education  2021 [employer].

## 2020-09-09 ENCOUNTER — Other Ambulatory Visit: Payer: Self-pay

## 2020-09-09 DIAGNOSIS — G8929 Other chronic pain: Secondary | ICD-10-CM

## 2020-09-09 DIAGNOSIS — J45990 Exercise induced bronchospasm: Secondary | ICD-10-CM

## 2020-09-09 MED ORDER — MELOXICAM 7.5 MG PO TABS: 7.5000 mg | ORAL_TABLET | Freq: Every day | ORAL | 0 refills | Status: DC

## 2020-09-09 MED ORDER — ALBUTEROL SULFATE HFA 108 (90 BASE) MCG/ACT IN AERS: INHALATION_SPRAY | RESPIRATORY_TRACT | 0 refills | Status: DC

## 2020-09-09 NOTE — Progress Notes (Signed)
Albuterol and Meloxicam reordered

## 2020-09-17 ENCOUNTER — Telehealth: Payer: Medicaid Other | Admitting: Nurse Practitioner

## 2020-09-17 DIAGNOSIS — M545 Low back pain, unspecified: Secondary | ICD-10-CM

## 2020-09-17 MED ORDER — NAPROXEN 500 MG PO TABS: 500.0000 mg | ORAL_TABLET | Freq: Two times a day (BID) | ORAL | 0 refills | Status: DC

## 2020-09-17 MED ORDER — BACLOFEN 10 MG PO TABS: 10.0000 mg | ORAL_TABLET | Freq: Three times a day (TID) | ORAL | 0 refills | Status: DC

## 2020-09-17 NOTE — Progress Notes (Signed)

## 2020-10-07 ENCOUNTER — Other Ambulatory Visit: Payer: Self-pay

## 2020-10-07 ENCOUNTER — Ambulatory Visit (INDEPENDENT_AMBULATORY_CARE_PROVIDER_SITE_OTHER): Payer: Medicaid Other | Admitting: Family

## 2020-10-07 ENCOUNTER — Encounter: Payer: Self-pay | Admitting: Family

## 2020-10-07 VITALS — BP 126/81 | HR 60 | Ht 59.92 in | Wt 200.6 lb

## 2020-10-07 DIAGNOSIS — H6121 Impacted cerumen, right ear: Secondary | ICD-10-CM | POA: Diagnosis not present

## 2020-10-07 DIAGNOSIS — E559 Vitamin D deficiency, unspecified: Secondary | ICD-10-CM

## 2020-10-07 DIAGNOSIS — Z130 Encounter for screening for diseases of the blood and blood-forming organs and certain disorders involving the immune mechanism: Secondary | ICD-10-CM | POA: Diagnosis not present

## 2020-10-07 DIAGNOSIS — Z131 Encounter for screening for diabetes mellitus: Secondary | ICD-10-CM

## 2020-10-07 DIAGNOSIS — Z1321 Encounter for screening for nutritional disorder: Secondary | ICD-10-CM

## 2020-10-07 DIAGNOSIS — Z124 Encounter for screening for malignant neoplasm of cervix: Secondary | ICD-10-CM

## 2020-10-07 DIAGNOSIS — Z1159 Encounter for screening for other viral diseases: Secondary | ICD-10-CM | POA: Diagnosis not present

## 2020-10-07 DIAGNOSIS — Z1329 Encounter for screening for other suspected endocrine disorder: Secondary | ICD-10-CM

## 2020-10-07 DIAGNOSIS — Z13220 Encounter for screening for lipoid disorders: Secondary | ICD-10-CM

## 2020-10-07 DIAGNOSIS — F419 Anxiety disorder, unspecified: Secondary | ICD-10-CM | POA: Diagnosis not present

## 2020-10-07 DIAGNOSIS — Z0000 Encounter for general adult medical examination without abnormal findings: Secondary | ICD-10-CM

## 2020-10-07 DIAGNOSIS — Z13228 Encounter for screening for other metabolic disorders: Secondary | ICD-10-CM

## 2020-10-07 DIAGNOSIS — F32A Depression, unspecified: Secondary | ICD-10-CM

## 2020-10-07 MED ORDER — CARBAMIDE PEROXIDE 6.5 % OT SOLN: OTIC | 0 refills | Status: DC

## 2020-10-07 NOTE — Progress Notes (Signed)
Physical  Fatigue

## 2020-10-07 NOTE — Patient Instructions (Addendum)
Annual physical exam and labs today.   Referral to Gynecology.   Follow-up with primary provider as scheduled.  Preventive Care 34-34 Years Old, Female Preventive care refers to lifestyle choices and visits with your health care provider that can promote health and wellness. This includes:  A yearly physical exam. This is also called an annual wellness visit.  Regular dental and eye exams.  Immunizations.  Screening for certain conditions.  Healthy lifestyle choices, such as: ? Eating a healthy diet. ? Getting regular exercise. ? Not using drugs or products that contain nicotine and tobacco. ? Limiting alcohol use. What can I expect for my preventive care visit? Physical exam Your health care provider may check your:  Height and weight. These may be used to calculate your BMI (body mass index). BMI is a measurement that tells if you are at a healthy weight.  Heart rate and blood pressure.  Body temperature.  Skin for abnormal spots. Counseling Your health care provider may ask you questions about your:  Past medical problems.  Family's medical history.  Alcohol, tobacco, and drug use.  Emotional well-being.  Home life and relationship well-being.  Sexual activity.  Diet, exercise, and sleep habits.  Work and work Statistician.  Access to firearms.  Method of birth control.  Menstrual cycle.  Pregnancy history. What immunizations do I need? Vaccines are usually given at various ages, according to a schedule. Your health care provider will recommend vaccines for you based on your age, medical history, and lifestyle or other factors, such as travel or where you work.   What tests do I need? Blood tests  Lipid and cholesterol levels. These may be checked every 5 years starting at age 38.  Hepatitis C test.  Hepatitis B test. Screening  Diabetes screening. This is done by checking your blood sugar (glucose) after you have not eaten for a while  (fasting).  STD (sexually transmitted disease) testing, if you are at risk.  BRCA-related cancer screening. This may be done if you have a family history of breast, ovarian, tubal, or peritoneal cancers.  Pelvic exam and Pap test. This may be done every 3 years starting at age 78. Starting at age 66, this may be done every 5 years if you have a Pap test in combination with an HPV test. Talk with your health care provider about your test results, treatment options, and if necessary, the need for more tests.   Follow these instructions at home: Eating and drinking  Eat a healthy diet that includes fresh fruits and vegetables, whole grains, lean protein, and low-fat dairy products.  Take vitamin and mineral supplements as recommended by your health care provider.  Do not drink alcohol if: ? Your health care provider tells you not to drink. ? You are pregnant, may be pregnant, or are planning to become pregnant.  If you drink alcohol: ? Limit how much you have to 0-1 drink a day. ? Be aware of how much alcohol is in your drink. In the [place] one drink equals one 12 oz bottle of beer (355 mL), one 5 oz glass of wine (148 mL), or one 1 oz glass of hard liquor (44 mL).   Lifestyle  Take daily care of your teeth and gums. Brush your teeth every morning and night with fluoride toothpaste. Floss one time each day.  Stay active. Exercise for at least 30 minutes 5 or more days each week.  Do not use any products that contain nicotine or  tobacco, such as cigarettes, e-cigarettes, and chewing tobacco. If you need help quitting, ask your health care provider.  Do not use drugs.  If you are sexually active, practice safe sex. Use a condom or other form of protection to prevent STIs (sexually transmitted infections).  If you do not wish to become pregnant, use a form of birth control. If you plan to become pregnant, see your health care provider for a prepregnancy visit.  Find healthy ways to cope  with stress, such as: ? Meditation, yoga, or listening to music. ? Journaling. ? Talking to a trusted person. ? Spending time with friends and family. Safety  Always wear your seat belt while driving or riding in a vehicle.  Do not drive: ? If you have been drinking alcohol. Do not ride with someone who has been drinking. ? When you are tired or distracted. ? While texting.  Wear a helmet and other protective equipment during sports activities.  If you have firearms in your house, make sure you follow all gun safety procedures.  Seek help if you have been physically or sexually abused. What's next?  Go to your health care provider once a year for an annual wellness visit.  Ask your health care provider how often you should have your eyes and teeth checked.  Stay up to date on all vaccines. This information is not intended to replace advice given to you by your health care provider. Make sure you discuss any questions you have with your health care provider. Document Revised: 03/02/2020 Document Reviewed: 03/16/2018 Elsevier Patient Education  2021 [employer].

## 2020-10-07 NOTE — Progress Notes (Signed)
Patient ID: Morgan Newton, female    DOB: 1985/11/10  MRN: [id number]  CC: Annual Physical Exam   Subjective: Morgan Newton is a 35 y.o. female who presents for annual physical exam. Her concerns today include:   Fatigue which has been long-term. Sometimes feels that she isn't getting enough sleep and other times feeling that she is getting too much sleep.   Endorses anxiety and depression related to [employer]. Declines medication at this time. She did have counseling therapy in the past which helped. Not ready for counseling therapy as of yet. May consider counseling therapy in the future. Denies thoughts of self-harm, suicidal ideations, and homicidal ideations.   Depression screen [facility] 2/9 10/07/2020 09/08/2020 04/21/2020  Decreased Interest 1 0 1  Down, Depressed, Hopeless 1 0 2  PHQ - 2 Score 2 0 3  Altered sleeping 1 - 3  Tired, decreased energy 1 - 2  Change in appetite 1 - 3  Feeling bad or failure about yourself  0 - 2  Trouble concentrating 0 - 2  Moving slowly or fidgety/restless 0 - 2  Suicidal thoughts 0 - 0  PHQ-9 Score 5 - 17  Difficult doing work/chores Not difficult at all - -    Patient Active Problem List   Diagnosis Date Noted  . Vitamin D deficiency 10/08/2020  . Right knee pain 06/20/2020  . Missed abortion 04/14/2020     Current Outpatient Medications on File Prior to Visit  Medication Sig Dispense Refill  . albuterol (VENTOLIN HFA) 108 (90 Base) MCG/ACT inhaler Two (2) inhalations five to twenty (5 to 20) minutes prior to exercise. 8 g 0  . baclofen (LIORESAL) 10 MG tablet Take 1 tablet (10 mg total) by mouth 3 (three) times daily. (Patient taking differently: Take 10 mg by mouth daily as needed.) 30 each 0  . cetirizine (ZYRTEC ALLERGY) 10 MG tablet Take 1 tablet (10 mg total) by mouth daily. 30 tablet 0  . fluticasone (FLONASE) 50 MCG/ACT nasal spray Place 2 sprays into both nostrils daily. 16 g 0  . meloxicam (MOBIC) 7.5 MG tablet Take 1  tablet (7.5 mg total) by mouth daily. 60 tablet 0  . naproxen (NAPROSYN) 500 MG tablet Take 1 tablet (500 mg total) by mouth 2 (two) times daily with a meal. 30 tablet 0  . [DISCONTINUED] misoprostol (CYTOTEC) 200 MCG tablet Take 2 tablets by mouth and 2 intravaginally for one dose 4 tablet 0   No current facility-administered medications on file prior to visit.    Allergies  Allergen Reactions  . Penicillins Shortness Of Breath and Palpitations    .Marland KitchenHas patient had a PCN reaction causing immediate rash, facial/tongue/throat swelling, SOB or lightheadedness with hypotension: NO Has patient had a PCN reaction causing severe rash involving mucus membranes or skin necrosis: NO Has patient had a PCN reaction that required hospitalization No Has patient had a PCN reaction occurring within the last 10 years: No If all of the above answers are "NO", then may proceed with Cephalosporin use.   . Caffeine Other (See Comments)    Heart murmur    Social History   Socioeconomic History  . Marital status: Single    Spouse name: Not on file  . Number of children: Not on file  . Years of education: Not on file  . Highest education level: Not on file  Occupational History  . Not on file  Tobacco Use  . Smoking status: Current Some Day Smoker  Packs/day: 0.25    Types: Cigarettes  . Smokeless tobacco: Never Used  Vaping Use  . Vaping Use: Never used  Substance and Sexual Activity  . Alcohol use: Yes    Alcohol/week: 3.0 standard drinks    Types: 3 Standard drinks or equivalent per week    Comment: rarely  . Drug use: No  . Sexual activity: Yes    Birth control/protection: None  Other Topics Concern  . Not on file  Social History Narrative  . Not on file   Social Determinants of Health   Financial Resource Strain: Not on file  Food Insecurity: Food Insecurity Present  . Worried About Charity fundraiser in the Last Year: Never true  . Ran Out of Food in the Last Year: Sometimes  true  Transportation Needs: Unmet Transportation Needs  . Lack of Transportation (Medical): No  . Lack of Transportation (Non-Medical): Yes  Physical Activity: Not on file  Stress: Not on file  Social Connections: Not on file  Intimate Partner Violence: Not on file    Family History  Problem Relation Age of Onset  . Hypertension Maternal Grandfather   . Hypertension Mother   . Thyroid disease Mother   . Hypertension Maternal Grandmother   . Thyroid disease Maternal Grandmother   . Asthma Father   . Asthma Maternal Aunt   . Asthma Paternal Grandmother   . Other Neg Hx   . Hearing loss Neg Hx     Past Surgical History:  Procedure Laterality Date  . CESAREAN SECTION  07/22/2012   Procedure: CESAREAN SECTION;  Surgeon: Frederico Hamman, MD;  Location: [facility];  Service: Obstetrics;  Laterality: N/A;  Primary Cesarean Section   . GANGLION CYST EXCISION    . MOUTH SURGERY      ROS: Review of Systems Negative except as stated above  PHYSICAL EXAM: BP 126/81 (BP Location: Left Arm, Patient Position: Sitting)   Pulse 60   Ht 4' 11.92" (1.522 m)   Wt 200 lb 9.6 oz (91 kg)   SpO2 98%   BMI 39.28 kg/m   Wt Readings from Last 3 Encounters:  10/07/20 200 lb 9.6 oz (91 kg)  09/08/20 198 lb 12.8 oz (90.2 kg)  07/24/20 193 lb (87.5 kg)   Physical Exam Exam conducted with a chaperone present.  HENT:     Head: Normocephalic and atraumatic.     Right Ear: Tympanic membrane, ear canal and external ear normal.     Left Ear: Tympanic membrane, ear canal and external ear normal.     Nose: Nose normal.     Mouth/Throat:     Mouth: Mucous membranes are moist.     Pharynx: Oropharynx is clear.  Eyes:     Extraocular Movements: Extraocular movements intact.     Conjunctiva/sclera: Conjunctivae normal.     Pupils: Pupils are equal, round, and reactive to light.  Cardiovascular:     Rate and Rhythm: Normal rate. Rhythm irregular.     Pulses: Normal pulses.     Heart sounds: Normal  heart sounds.  Pulmonary:     Effort: Pulmonary effort is normal.     Breath sounds: Normal breath sounds.  Chest:  Breasts:     Right: Normal.     Left: Normal.      Comments: Elmon Else, CMA present during examination.  Abdominal:     General: Bowel sounds are normal.     Palpations: Abdomen is soft.  Genitourinary:    Comments:  Patient declined examination.  Musculoskeletal:        General: Normal range of motion.     Cervical back: Normal range of motion and neck supple.  Skin:    General: Skin is warm and dry.     Capillary Refill: Capillary refill takes less than 2 seconds.  Neurological:     General: No focal deficit present.     Mental Status: She is alert and oriented to person, place, and time.  Psychiatric:        Mood and Affect: Mood normal.        Behavior: Behavior normal.     ASSESSMENT AND PLAN: 1. Annual physical exam: - Counseled on 150 minutes of exercise per week as tolerated, healthy eating (including decreased daily intake of saturated fats, cholesterol, added sugars, sodium), STI prevention, and routine healthcare maintenance.  2. Screening for metabolic disorder: - CMP to check kidney function, liver function, and electrolyte balance.  - Comprehensive metabolic panel  3. Screening for deficiency anemia: - CBC and iron panel to screen for anemia. - CBC - Iron, TIBC and Ferritin Panel  4. Diabetes mellitus screening: - Hemoglobin A1c to screen for pre-diabetes/diabetes. - Hemoglobin A1c  5. Screening cholesterol level: - Lipid panel to screen for high cholesterol.  - Lipid panel  6. Thyroid disorder screen: - TSH to check thyroid function.  - TSH+T4F+T3Free  7. Need for hepatitis C screening test: - Hepatitis antibody to screen for hepatitis C.  - Hepatitis C Antibody  8. Cervical cancer screening: - Referral to Gynecology for cervical cancer screening by PAP smear.  - Ambulatory referral to Gynecology  9. Encounter for  vitamin deficiency screening: - Vitamin D and Vitamin B12 screening for deficiency. - Vitamin D, 25-hydroxy - Vitamin B12  10. Excessive cerumen in right ear canal: - Carbamide peroxide as prescribed.  - Return within 1 week or sooner if needed for nurse visit ear lavage. - Follow-up with primary provider as scheduled.  - carbamide peroxide (DEBROX) 6.5 % OTIC solution; Place 5 drops into right ear twice (2 times) daily. Do not use for more than 3 days in a row. Return to office for right ear irrigation.  Dispense: 15 mL; Refill: 0  11. Anxiety and depression: - Stable. - Denies thoughts of self-harm, suicidal ideations, and homicidal ideations.  - Declines pharmacological management at this time.  - She did have counseling therapy in the past which helped. Not ready for counseling therapy as of yet. May consider counseling therapy in the future.  - Follow-up with primary provider as scheduled.    Patient was given the opportunity to ask questions.  Patient verbalized understanding of the plan and was able to repeat key elements of the plan. Patient was given clear instructions to go to Emergency Department or return to medical center if symptoms don't improve, worsen, or new problems develop.The patient verbalized understanding.    Orders Placed This Encounter  Procedures  . Hepatitis C Antibody  . CBC  . Comprehensive metabolic panel  . Lipid panel  . TSH+T4F+T3Free  . Hemoglobin A1c  . Vitamin D, 25-hydroxy  . Vitamin B12  . Iron, TIBC and Ferritin Panel  . Ambulatory referral to Gynecology     Requested Prescriptions   Signed Prescriptions Disp Refills  . carbamide peroxide (DEBROX) 6.5 % OTIC solution 15 mL 0    Sig: Place 5 drops into right ear twice (2 times) daily. Do not use for more than 3 days in a  row. Return to office for right ear irrigation.  . Cholecalciferol 1.25 MG (50000 UT) capsule 12 capsule 0    Sig: Take 1 capsule (50,000 Units total) by mouth once a  week.    Follow-up with primary provider as scheduled.   Camillia Herter, NP

## 2020-10-08 DIAGNOSIS — E559 Vitamin D deficiency, unspecified: Secondary | ICD-10-CM | POA: Insufficient documentation

## 2020-10-08 LAB — HEPATITIS C ANTIBODY: Hep C Virus Ab: <0.1 s/co ratio (ref 0.0–0.9)

## 2020-10-08 LAB — IRON,TIBC AND FERRITIN PANEL
Ferritin: 29 ng/mL (ref 15–150)
Iron Saturation: 10 % — ABNORMAL LOW (ref 15–55)
Iron: 34 ug/dL (ref 27–159)
Total Iron Binding Capacity: 340 ug/dL (ref 250–450)
UIBC: 306 ug/dL (ref 131–425)

## 2020-10-08 LAB — COMPREHENSIVE METABOLIC PANEL
ALT: 15 IU/L (ref 0–32)
AST: 19 IU/L (ref 0–40)
Albumin/Globulin Ratio: 1.3 (ref 1.2–2.2)
Albumin: 3.8 g/dL (ref 3.8–4.8)
Alkaline Phosphatase: 65 IU/L (ref 44–121)
BUN/Creatinine Ratio: 10 (ref 9–23)
BUN: 9 mg/dL (ref 6–20)
Bilirubin Total: 0.2 mg/dL (ref 0.0–1.2)
CO2: 22 mmol/L (ref 20–29)
Calcium: 9.2 mg/dL (ref 8.7–10.2)
Chloride: 103 mmol/L (ref 96–106)
Creatinine, Ser: 0.94 mg/dL (ref 0.57–1.00)
Globulin, Total: 2.9 g/dL (ref 1.5–4.5)
Glucose: 98 mg/dL (ref 65–99)
Potassium: 5.2 mmol/L (ref 3.5–5.2)
Sodium: 140 mmol/L (ref 134–144)
Total Protein: 6.7 g/dL (ref 6.0–8.5)
eGFR: 82 mL/min/{1.73_m2} (ref >59)

## 2020-10-08 LAB — CBC
Hematocrit: 40.6 % (ref 34.0–46.6)
Hemoglobin: 13.6 g/dL (ref 11.1–15.9)
MCH: 30.8 pg (ref 26.6–33.0)
MCHC: 33.5 g/dL (ref 31.5–35.7)
MCV: 92 fL (ref 79–97)
Platelets: 346 10*3/uL (ref 150–450)
RBC: 4.41 x10E6/uL (ref 3.77–5.28)
RDW: 13.8 % (ref 11.7–15.4)
WBC: 6.2 10*3/uL (ref 3.4–10.8)

## 2020-10-08 LAB — HEMOGLOBIN A1C
Est. average glucose Bld gHb Est-mCnc: 120 mg/dL
Hgb A1c MFr Bld: 5.8 % — ABNORMAL HIGH (ref 4.8–5.6)

## 2020-10-08 LAB — LIPID PANEL
Chol/HDL Ratio: 3.9 ratio (ref 0.0–4.4)
Cholesterol, Total: 183 mg/dL (ref 100–199)
HDL: 47 mg/dL (ref >39)
LDL Chol Calc (NIH): 123 mg/dL — ABNORMAL HIGH (ref 0–99)
Triglycerides: 72 mg/dL (ref 0–149)
VLDL Cholesterol Cal: 13 mg/dL (ref 5–40)

## 2020-10-08 LAB — TSH+T4F+T3FREE
Free T4: 0.92 ng/dL (ref 0.82–1.77)
T3, Free: 2.8 pg/mL (ref 2.0–4.4)
TSH: 1.67 u[IU]/mL (ref 0.450–4.500)

## 2020-10-08 LAB — VITAMIN B12: Vitamin B-12: 563 pg/mL (ref 232–1245)

## 2020-10-08 LAB — VITAMIN D 25 HYDROXY (VIT D DEFICIENCY, FRACTURES): Vit D, 25-Hydroxy: 9.5 ng/mL — ABNORMAL LOW (ref 30.0–100.0)

## 2020-10-08 MED ORDER — CHOLECALCIFEROL 1.25 MG (50000 UT) PO CAPS: 50000.0000 [IU] | ORAL_CAPSULE | ORAL | 0 refills | Status: DC

## 2020-10-08 NOTE — Progress Notes (Signed)
Kidney function normal.   Liver function normal.  Thyroid normal.   Vitamin B12 normal.   No anemia.   Hepatitis C negative.  LDL cholesterol, sometimes called "bad cholesterol", higher than expected. This may increase risk of heart attack and/or stroke. Consider eating more fruits, vegetables, and lean baked meats such as chicken or fish. Moderate intensity exercise at least 150 minutes as tolerated per week may help as well.    Your hemoglobin A1c is consistent with pre-diabetes. Practice healthy eating habits of fresh fruit and vegetables, lean baked meats such as chicken, fish, and [place]; limit breads, rice, pastas, and desserts; practice regular aerobic exercise (at least 150 minutes a week as tolerated). Patient encouraged to schedule appointment to have lab rechecked in 6 months or sooner if needed.  Vitamin D low. A vitamin D supplement is prescribed. Patient encouraged to schedule repeat lab in 12 weeks.  There are some minor variations in your blood work that do not require any additional work up at this time.

## 2020-10-21 ENCOUNTER — Encounter: Payer: Self-pay | Admitting: Family

## 2020-10-21 ENCOUNTER — Other Ambulatory Visit: Payer: Self-pay | Admitting: Family

## 2020-10-21 DIAGNOSIS — H6121 Impacted cerumen, right ear: Secondary | ICD-10-CM

## 2020-10-22 MED ORDER — CARBAMIDE PEROXIDE 6.5 % OT SOLN: OTIC | 0 refills | Status: DC

## 2020-10-22 NOTE — Telephone Encounter (Signed)
Carbamide Peroxide refilled per patient request.

## 2020-10-24 ENCOUNTER — Ambulatory Visit: Payer: Medicaid Other

## 2020-10-24 ENCOUNTER — Other Ambulatory Visit: Payer: Self-pay

## 2020-10-24 DIAGNOSIS — H6121 Impacted cerumen, right ear: Secondary | ICD-10-CM

## 2020-10-24 NOTE — Progress Notes (Signed)
Right ear irrigation

## 2020-12-22 ENCOUNTER — Encounter: Payer: Self-pay | Admitting: Family

## 2020-12-24 ENCOUNTER — Encounter: Payer: Medicaid Other | Admitting: Family Medicine

## 2021-01-15 ENCOUNTER — Encounter: Payer: Self-pay | Admitting: Family

## 2021-02-04 ENCOUNTER — Encounter: Payer: Self-pay | Admitting: Obstetrics and Gynecology

## 2021-02-04 ENCOUNTER — Other Ambulatory Visit (HOSPITAL_COMMUNITY)
Admission: RE | Admit: 2021-02-04 | Discharge: 2021-02-04 | Disposition: A | Discharge status: Home / Self Care | Payer: Medicaid Other | Source: Ambulatory Visit | Attending: Family Medicine | Admitting: Family Medicine

## 2021-02-04 ENCOUNTER — Ambulatory Visit (INDEPENDENT_AMBULATORY_CARE_PROVIDER_SITE_OTHER): Payer: Medicaid Other | Admitting: Obstetrics and Gynecology

## 2021-02-04 ENCOUNTER — Other Ambulatory Visit: Payer: Self-pay

## 2021-02-04 DIAGNOSIS — Z3201 Encounter for pregnancy test, result positive: Secondary | ICD-10-CM | POA: Insufficient documentation

## 2021-02-04 DIAGNOSIS — Z01419 Encounter for gynecological examination (general) (routine) without abnormal findings: Secondary | ICD-10-CM | POA: Diagnosis not present

## 2021-02-04 DIAGNOSIS — N938 Other specified abnormal uterine and vaginal bleeding: Secondary | ICD-10-CM | POA: Diagnosis not present

## 2021-02-04 HISTORY — DX: Other specified abnormal uterine and vaginal bleeding: N93.8

## 2021-02-04 LAB — POCT PREGNANCY, URINE: Preg Test, Ur: POSITIVE — AB

## 2021-02-04 MED ORDER — PREPLUS 27-1 MG PO TABS: 1.0000 | ORAL_TABLET | Freq: Every day | ORAL | 13 refills | Status: DC

## 2021-02-04 NOTE — Progress Notes (Signed)
Morgan Newton is a 35 y.o. [phone] female here for a routine annual gynecologic exam.  Current complaints: DUB for the last year. Prior she reports monthly cycles. Sexual active without problems. H/O abnormal pap smear in the past, colpo was normal.     Gynecologic History No LMP recorded. Contraception: none Last Pap: Unknown. Results were: normal Last mammogram: NA.  Obstetric History OB History  Gravida Para Term Preterm AB Living  6 3 2 1 2 2   SAB IAB Ectopic Multiple Live Births  1 1     3     # Outcome Date GA Lbr Len/2nd Weight Sex Delivery Anes PTL Lv  6 Gravida           5 Term 07/22/12 [redacted]w[redacted]d  M CS-LTranv EPI  LIV     Birth Comments: Has superficial right cheek laceration from uterine incision (noted to have a few drops of blood during the minutes of age)  483Preterm 06/02/08 256w0d M Vag-Spont   ND     Birth Comments: Induced  3 Term 10/29/05 3962w0d lb 13 oz (3.09 kg) F Vag-Spont  N LIV     Birth Comments: Gest Diabetes  2 SAB           1 IAB             Past Medical History:  Diagnosis Date   Abnormal Pap smear 2012   colpo   Anemia    Asthma    Chlamydia    Depression    Genital herpes    Gonorrhea    Heart murmur    Hypertension    PIH   Infection    urinary tract infection   PID (acute pelvic inflammatory disease)    Pregnancy induced hypertension    Trichimoniasis     Past Surgical History:  Procedure Laterality Date   CESAREAN SECTION  07/22/2012   Procedure: CESAREAN SECTION;  Surgeon: BerFrederico HammanD;  [facility];  Service: Obstetrics;  Laterality: N/A;  Primary Cesarean Section    GANGLION CYST EXCISION     MOUTH SURGERY      Current Outpatient Medications on File Prior to Visit  Medication Sig Dispense Refill   albuterol (VENTOLIN HFA) 108 (90 Base) MCG/ACT inhaler Two (2) inhalations five to twenty (5 to 20) minutes prior to exercise. 8 g 0   baclofen (LIORESAL) 10 MG tablet Take 1 tablet (10 mg total) by mouth 3 (three)  times daily. (Patient taking differently: Take 10 mg by mouth daily as needed.) 30 each 0   carbamide peroxide (DEBROX) 6.5 % OTIC solution Place 5 drops into right ear twice (2 times) daily. Do not use for more than 3 days in a row. Return to office for right ear irrigation. 15 mL 0   cetirizine (ZYRTEC ALLERGY) 10 MG tablet Take 1 tablet (10 mg total) by mouth daily. 30 tablet 0   Cholecalciferol 1.25 MG (50000 UT) capsule Take 1 capsule (50,000 Units total) by mouth once a week. 12 capsule 0   fluticasone (FLONASE) 50 MCG/ACT nasal spray Place 2 sprays into both nostrils daily. 16 g 0   meloxicam (MOBIC) 7.5 MG tablet Take 1 tablet (7.5 mg total) by mouth daily. 60 tablet 0   [DISCONTINUED] misoprostol (CYTOTEC) 200 MCG tablet Take 2 tablets by mouth and 2 intravaginally for one dose 4 tablet 0   No current facility-administered medications on file prior to visit.    Allergies  Allergen Reactions   Penicillins Shortness Of Breath and Palpitations    .Marland KitchenHas patient had a PCN reaction causing immediate rash, facial/tongue/throat swelling, SOB or lightheadedness with hypotension: NO Has patient had a PCN reaction causing severe rash involving mucus membranes or skin necrosis: NO Has patient had a PCN reaction that required hospitalization No Has patient had a PCN reaction occurring within the last 10 years: No If all of the above answers are "NO", then may proceed with Cephalosporin use.    Caffeine Other (See Comments)    Heart murmur    Social History   Socioeconomic History   Marital status: Single    Spouse name: Not on file   Number of children: Not on file   Years of education: Not on file   Highest education level: Not on file  Occupational History   Not on file  Tobacco Use   Smoking status: Some Days    Packs/day: 0.25    Types: Cigarettes   Smokeless tobacco: Never  Vaping Use   Vaping Use: Never used  Substance and Sexual Activity   Alcohol use: Yes     Alcohol/week: 3.0 standard drinks    Types: 3 Standard drinks or equivalent per week    Comment: rarely   Drug use: No   Sexual activity: Yes    Birth control/protection: None  Other Topics Concern   Not on file  Social History Narrative   Not on file   Social Determinants of Health   Financial Resource Strain: Not on file  Food Insecurity: Food Insecurity Present   Worried About Charity fundraiser in the Last Year: Sometimes true   Arboriculturist in the Last Year: Sometimes true  Transportation Needs: Unmet Transportation Needs   Lack of Transportation (Medical): Yes   Lack of Transportation (Non-Medical): Yes  Physical Activity: Not on file  Stress: Not on file  Social Connections: Not on file  Intimate Partner Violence: Not on file    Family History  Problem Relation Age of Onset   Hypertension Maternal Grandfather    Hypertension Mother    Thyroid disease Mother    Hypertension Maternal Grandmother    Thyroid disease Maternal Grandmother    Asthma Father    Asthma Maternal Aunt    Asthma Paternal Grandmother    Other Neg Hx    Hearing loss Neg Hx     The following portions of the patient's history were reviewed and updated as appropriate: allergies, current medications, past family history, past medical history, past social history, past surgical history and problem list.  Review of Systems Pertinent items noted in HPI and remainder of comprehensive ROS otherwise negative.   Objective:  BP (!) 141/95   Pulse 94   Wt 196 lb 8 oz (89.1 kg)   BMI 38.48 kg/m  CONSTITUTIONAL: Well-developed, well-nourished female in no acute distress.  HENT:  Normocephalic, atraumatic, External right and left ear normal. Oropharynx is clear and moist EYES: Conjunctivae and EOM are normal. Pupils are equal, round, and reactive to light. No scleral icterus.  NECK: Normal range of motion, supple, no masses.  Normal thyroid.  SKIN: Skin is warm and dry. No rash noted. Not  diaphoretic. No erythema. No pallor. [place]: Alert and oriented to person, place, and time. Normal reflexes, muscle tone coordination. No cranial nerve deficit noted. PSYCHIATRIC: Normal mood and affect. Normal behavior. Normal judgment and thought content. CARDIOVASCULAR: Normal heart rate noted, regular rhythm RESPIRATORY: Clear to auscultation bilaterally.  Effort and breath sounds normal, no problems with respiration noted. BREASTS: Symmetric in size. No masses, skin changes, nipple drainage, or lymphadenopathy. ABDOMEN: Soft, normal bowel sounds, no distention noted.  No tenderness, rebound or guarding.  PELVIC: Normal appearing external genitalia; normal appearing vaginal mucosa and cervix.  No abnormal discharge noted.  Pap smear obtained.  Normal uterine size, no other palpable masses, no uterine or adnexal tenderness. MUSCULOSKELETAL: Normal range of motion. No tenderness.  No cyanosis, clubbing, or edema.  2+ distal pulses.   Assessment:  Annual gynecologic examination with pap smear DUB + UPT Plan:  Will follow up results of pap smear and manage accordingly. UPT positive Will check OB U/S Routine preventative health maintenance measures emphasized. Please refer to After Visit Summary for other counseling recommendations.  F/U and start of prenatal care as per OB U/S results   Chancy Milroy, MD, [place] Attending [place] [facility], [place]

## 2021-02-04 NOTE — Patient Instructions (Signed)
Health Maintenance, Female Adopting a healthy lifestyle and getting preventive care are important in promoting health and wellness. Ask your health care provider about: The right schedule for you to have regular tests and exams. Things you can do on your own to prevent diseases and keep yourself healthy. What should I know about diet, weight, and exercise? Eat a healthy diet  Eat a diet that includes plenty of vegetables, fruits, low-fat dairy products, and lean protein. Do not eat a lot of foods that are high in solid fats, added sugars, or sodium.  Maintain a healthy weight Body mass index (BMI) is used to identify weight problems. It estimates body fat based on height and weight. Your health care provider can help determineyour BMI and help you achieve or maintain a healthy weight. Get regular exercise Get regular exercise. This is one of the most important things you can do for your health. Most adults should: Exercise for at least 150 minutes each week. The exercise should increase your heart rate and make you sweat (moderate-intensity exercise). Do strengthening exercises at least twice a week. This is in addition to the moderate-intensity exercise. Spend less time sitting. Even light physical activity can be beneficial. Watch cholesterol and blood lipids Have your blood tested for lipids and cholesterol at 34 years of age, then havethis test every 5 years. Have your cholesterol levels checked more often if: Your lipid or cholesterol levels are high. You are older than 34 years of age. You are at high risk for heart disease. What should I know about cancer screening? Depending on your health history and family history, you may need to have cancer screening at various ages. This may include screening for: Breast cancer. Cervical cancer. Colorectal cancer. Skin cancer. Lung cancer. What should I know about heart disease, diabetes, and high blood pressure? Blood pressure and heart  disease High blood pressure causes heart disease and increases the risk of stroke. This is more likely to develop in people who have high blood pressure readings, are of African descent, or are overweight. Have your blood pressure checked: Every 3-5 years if you are 35-48 years of age. Every year if you are 87 years old or older. Diabetes Have regular diabetes screenings. This checks your fasting blood sugar level. Have the screening done: Once every three years after age 52 if you are at a normal weight and have a low risk for diabetes. More often and at a younger age if you are overweight or have a high risk for diabetes. What should I know about preventing infection? Hepatitis B If you have a higher risk for hepatitis B, you should be screened for this virus. Talk with your health care provider to find out if you are at risk forhepatitis B infection. Hepatitis C Testing is recommended for: Everyone born from [birth year] through 1964. Anyone with known risk factors for hepatitis C. Sexually transmitted infections (STIs) Get screened for STIs, including gonorrhea and chlamydia, if: You are sexually active and are younger than 34 years of age. You are older than 34 years of age and your health care provider tells you that you are at risk for this type of infection. Your sexual activity has changed since you were last screened, and you are at increased risk for chlamydia or gonorrhea. Ask your health care provider if you are at risk. Ask your health care provider about whether you are at high risk for HIV. Your health care provider may recommend a prescription medicine to help  prevent HIV infection. If you choose to take medicine to prevent HIV, you should first get tested for HIV. You should then be tested every 3 months for as long as you are taking the medicine. Pregnancy If you are about to stop having your period (premenopausal) and you may become pregnant, seek counseling before you get  pregnant. Take 400 to 800 micrograms (mcg) of folic acid every day if you become pregnant. Ask for birth control (contraception) if you want to prevent pregnancy. Osteoporosis and menopause Osteoporosis is a disease in which the bones lose minerals and strength with aging. This can result in bone fractures. If you are 37 years old or older, or if you are at risk for osteoporosis and fractures, ask your health care provider if you should: Be screened for bone loss. Take a calcium or vitamin D supplement to lower your risk of fractures. Be given hormone replacement therapy (HRT) to treat symptoms of menopause. Follow these instructions at home: Lifestyle Do not use any products that contain nicotine or tobacco, such as cigarettes, e-cigarettes, and chewing tobacco. If you need help quitting, ask your health care provider. Do not use street drugs. Do not share needles. Ask your health care provider for help if you need support or information about quitting drugs. Alcohol use Do not drink alcohol if: Your health care provider tells you not to drink. You are pregnant, may be pregnant, or are planning to become pregnant. If you drink alcohol: Limit how much you use to 0-1 drink a day. Limit intake if you are breastfeeding. Be aware of how much alcohol is in your drink. In the [place] one drink equals one 12 oz bottle of beer (355 mL), one 5 oz glass of wine (148 mL), or one 1 oz glass of hard liquor (44 mL). General instructions Schedule regular health, dental, and eye exams. Stay current with your vaccines. Tell your health care provider if: You often feel depressed. You have ever been abused or do not feel safe at home. Summary Adopting a healthy lifestyle and getting preventive care are important in promoting health and wellness. Follow your health care provider's instructions about healthy diet, exercising, and getting tested or screened for diseases. Follow your health care provider's  instructions on monitoring your cholesterol and blood pressure. This information is not intended to replace advice given to you by your health care provider. Make sure you discuss any questions you have with your healthcare provider. Document Revised: 06/28/2018 Document Reviewed: 06/28/2018 Elsevier Patient Education  2022 [place]. Dysfunctional Uterine Bleeding Dysfunctional uterine bleeding is abnormal bleeding from the uterus. Dysfunctional uterine bleeding includes: A menstrual period that comes earlier or later than usual. A menstrual period that is lighter or heavier than usual, or has large blood clots. Vaginal bleeding between menstrual periods. Skipping one or more menstrual periods. Vaginal bleeding after sex. Vaginal bleeding after menopause. Follow these instructions at home: Eating and drinking  Eat well-balanced meals. Include foods that are high in iron, such as liver, meat, shellfish, green leafy vegetables, and eggs. To prevent or treat constipation, your health care provider may recommend that you: Drink enough fluid to keep your urine pale yellow. Take over-the-counter or prescription medicines. Eat foods that are high in fiber, such as beans, whole grains, and fresh fruits and vegetables. Limit foods that are high in fat and processed sugars, such as fried or sweet foods.  Medicines Take over-the-counter and prescription medicines only as told by your health care provider. Do  not change medicines without talking with your health care provider. Aspirin or medicines that contain aspirin may make the bleeding worse. Do not take those medicines: During the week before your menstrual period. During your menstrual period. If you were prescribed iron pills, take them as told by your health care provider. Iron pills help to replace iron that your body loses because of this condition. Activity If you need to change your sanitary pad or tampon more than one time every 2  hours: Lie in bed with your feet raised (elevated). Place a cold pack on your lower abdomen. Rest as much as possible until the bleeding stops or slows down. Do not try to lose weight until the bleeding has stopped and your blood iron level is back to normal. General instructions  For two months, write down: When your menstrual period starts. When your menstrual period ends. When any abnormal vaginal bleeding occurs. What problems you notice. Keep all follow up visits as told by your health care provider. This is important.  Contact a health care provider if you: Feel light-headed or weak. Have nausea and vomiting. Cannot eat or drink without vomiting. Feel dizzy or have diarrhea while you are taking medicines. Are taking birth control pills or hormones, and you want to change them or stop taking them. Get help right away if: You develop a fever or chills. You need to change your sanitary pad or tampon more than one time per hour. Your vaginal bleeding becomes heavier, or your flow contains clots more often. You develop pain in your abdomen. You lose consciousness. You develop a rash. Summary Dysfunctional uterine bleeding is abnormal bleeding from the uterus. It includes menstrual bleeding of abnormal duration, volume, or regularity. Bleeding after sex and after menopause are also considered dysfunctional uterine bleeding. This information is not intended to replace advice given to you by your health care provider. Make sure you discuss any questions you have with your healthcare provider. Document Revised: 12/14/2017 Document Reviewed: 12/14/2017 Elsevier Patient Education  [place].

## 2021-02-05 NOTE — Addendum Note (Signed)
Addended by: Georgia Lopes on: 02/05/2021 04:21 PM   Modules accepted: Orders

## 2021-02-06 LAB — CYTOLOGY - PAP
Comment: NEGATIVE
Diagnosis: NEGATIVE
High risk HPV: NEGATIVE

## 2021-02-23 ENCOUNTER — Other Ambulatory Visit: Payer: Self-pay | Admitting: Obstetrics and Gynecology

## 2021-02-23 ENCOUNTER — Ambulatory Visit (INDEPENDENT_AMBULATORY_CARE_PROVIDER_SITE_OTHER): Payer: Medicaid Other | Admitting: *Deleted

## 2021-02-23 ENCOUNTER — Other Ambulatory Visit: Payer: Self-pay

## 2021-02-23 ENCOUNTER — Ambulatory Visit
Admission: RE | Admit: 2021-02-23 | Discharge: 2021-02-23 | Disposition: A | Discharge status: Home / Self Care | Payer: Medicaid Other | Source: Ambulatory Visit | Attending: Obstetrics and Gynecology | Admitting: Obstetrics and Gynecology

## 2021-02-23 VITALS — BP 119/74 | HR 79 | Ht 59.0 in | Wt 200.2 lb

## 2021-02-23 DIAGNOSIS — O26841 Uterine size-date discrepancy, first trimester: Secondary | ICD-10-CM | POA: Diagnosis not present

## 2021-02-23 DIAGNOSIS — Z3201 Encounter for pregnancy test, result positive: Secondary | ICD-10-CM | POA: Insufficient documentation

## 2021-02-23 DIAGNOSIS — Z3A12 12 weeks gestation of pregnancy: Secondary | ICD-10-CM | POA: Diagnosis not present

## 2021-02-23 DIAGNOSIS — N83202 Unspecified ovarian cyst, left side: Secondary | ICD-10-CM | POA: Diagnosis not present

## 2021-02-23 DIAGNOSIS — Z369 Encounter for antenatal screening, unspecified: Secondary | ICD-10-CM | POA: Diagnosis not present

## 2021-02-23 DIAGNOSIS — O3680X0 Pregnancy with inconclusive fetal viability, not applicable or unspecified: Secondary | ICD-10-CM

## 2021-02-23 NOTE — Progress Notes (Signed)
Agree with A & P

## 2021-02-23 NOTE — Patient Instructions (Signed)
Prenatal Care Providers           [facility] [place] [facility] [place] [facility]  [place] [phone]  [facility] @ [place]  [phone]  [place] @ [facility]       [address] [phone]            [facility] [place] @ [place]     [phone]          [facility] [place] @ [facility]   [place] [phone]  [facility] [place] @ [place] [phone]     [facility] [place] @ [address] Linna Hoff)  [place]   [phone]     [place] [facility]  Phone: [facility]  Phone: [facility] Phone: [phone]  [facility] Phone: [phone]  [facility] Phone: [phone]  [facility] Phone: [phone]  [facility]  Phone: [phone]

## 2021-02-23 NOTE — Progress Notes (Signed)
10:10 Patient here for nurse visit for [place] results, results not available. Per protocol if 20 minutes and not results call [facility] to notify pt waiting for results and get [place] read, which was done. I notified patient we are aware she is waiting and we do not have results and I have called and asked them to read her [place].  Doyce Stonehouse,RN 10: 25 Results received and reviewed by Dr. Rip Harbour and show living baby at 31w2dwith EDd 09/05/21. Does show small retroplacental vessel which will be checked again when anatomy UKoreadone at 19 weeks. I reviewed results per Dr. ERip Harbourwith HNira Conn I also reviewed her meds. I also advised her to start prenatal care asap and Prenatal Vitamins. She would like to go here and will talk with registar at checkout. List of providers also placed in AVS.  Admiral Marcucci,RN

## 2021-03-03 ENCOUNTER — Telehealth (INDEPENDENT_AMBULATORY_CARE_PROVIDER_SITE_OTHER): Payer: Medicaid Other

## 2021-03-03 DIAGNOSIS — O169 Unspecified maternal hypertension, unspecified trimester: Secondary | ICD-10-CM

## 2021-03-03 DIAGNOSIS — O99891 Other specified diseases and conditions complicating pregnancy: Secondary | ICD-10-CM

## 2021-03-03 DIAGNOSIS — Z136 Encounter for screening for cardiovascular disorders: Secondary | ICD-10-CM

## 2021-03-03 DIAGNOSIS — Z3A00 Weeks of gestation of pregnancy not specified: Secondary | ICD-10-CM

## 2021-03-03 DIAGNOSIS — Z8759 Personal history of other complications of pregnancy, childbirth and the puerperium: Secondary | ICD-10-CM | POA: Insufficient documentation

## 2021-03-03 DIAGNOSIS — R011 Cardiac murmur, unspecified: Secondary | ICD-10-CM

## 2021-03-03 DIAGNOSIS — O0990 Supervision of high risk pregnancy, unspecified, unspecified trimester: Secondary | ICD-10-CM | POA: Insufficient documentation

## 2021-03-03 HISTORY — DX: Personal history of other complications of pregnancy, childbirth and the puerperium: Z87.59

## 2021-03-03 MED ORDER — BLOOD PRESSURE MONITORING DEVI: 1.0000 | 0 refills | Status: DC

## 2021-03-03 NOTE — Progress Notes (Signed)
New OB Intake  I connected with  Morgan Newton on 03/03/21 at  9:15 AM EDT by MyChart Video Visit and verified that I am speaking with the correct person using two identifiers. Nurse is located at [facility] and pt is located at home.  I discussed the limitations, risks, security and privacy concerns of performing an evaluation and management service by telephone and the availability of in person appointments. I also discussed with the patient that there may be a patient responsible charge related to this service. The patient expressed understanding and agreed to proceed.  I explained I am completing New OB Intake today. We discussed her EDD of 09/05/21 that is based on LMP of 11/29/20. Pt is G9/P2. I reviewed her allergies, medications, Medical/Surgical/OB history, and appropriate screenings. I informed her of [facility] services. Based on history, this is a/an  pregnancy complicated by hypertension and Heart Murmur  .   Patient Active Problem List   Diagnosis Date Noted   Visit for routine gyn exam 02/04/2021   DUB (dysfunctional uterine bleeding) 02/04/2021   Positive pregnancy test 02/04/2021   Vitamin D deficiency 10/08/2020    Concerns addressed today  Delivery Plans:  Plans to deliver at [facility].   MyChart/Babyscripts MyChart access verified. I explained pt will have some visits in office and some virtually. Babyscripts instructions given and order placed. Patient verifies receipt of registration text/e-mail. Account successfully created and app downloaded.  Blood Pressure Cuff  Blood pressure cuff ordered for patient to pick-up from [employer]. Explained after first prenatal appt pt will check weekly and document in 109.  Weight scale: Patient    have weight scale. Weight scale ordered for patient to pick up form [facility].   Anatomy US Explained first scheduled [place] will be around 19 weeks. Anatomy US scheduled for 04/13/21 at 9:45a. Pt notified to arrive at  9:30a.  Labs Discussed Johnsie Cancel genetic screening with patient. Would like both Panorama and Horizon drawn at new OB visit. Routine prenatal labs needed.  Covid Vaccine Patient has not covid vaccine.   Mother/ Baby Dyad Candidate?    If yes, offer as possibility  Informed patient of [facility] website  and placed link in her AVS.   Social Determinants of Health Food Insecurity: Patient expresses food insecurity. Food Market information given to patient; explained patient may visit at the end of first OB appointment. WIC Referral: Patient is interested in referral to [facility].  Transportation: Patient denies transportation needs. Childcare: Discussed no children allowed at ultrasound appointments. Offered childcare services; patient declines childcare services at this time.   Placed OB Box on problem list and updated  First visit review I reviewed new OB appt with pt. I explained she will have a pelvic exam, ob bloodwork with genetic screening, and PAP smear. Explained pt will be seen by Dr. Dione Plover at first visit; encounter routed to appropriate provider. Explained that patient will be seen by pregnancy navigator following visit with provider. [facility] information placed in AVS.   Bethanne Ginger, CMA 03/03/2021  9:15 AM

## 2021-03-11 ENCOUNTER — Encounter: Payer: Self-pay | Admitting: Family Medicine

## 2021-03-11 ENCOUNTER — Other Ambulatory Visit: Payer: Self-pay

## 2021-03-11 ENCOUNTER — Ambulatory Visit (INDEPENDENT_AMBULATORY_CARE_PROVIDER_SITE_OTHER): Payer: Medicaid Other | Admitting: Family Medicine

## 2021-03-11 ENCOUNTER — Other Ambulatory Visit (HOSPITAL_COMMUNITY)
Admission: RE | Admit: 2021-03-11 | Discharge: 2021-03-11 | Disposition: A | Discharge status: Home / Self Care | Payer: Medicaid Other | Source: Ambulatory Visit | Attending: Family Medicine | Admitting: Family Medicine

## 2021-03-11 VITALS — BP 113/75 | HR 85 | Wt 201.4 lb

## 2021-03-11 DIAGNOSIS — Z8759 Personal history of other complications of pregnancy, childbirth and the puerperium: Secondary | ICD-10-CM | POA: Diagnosis not present

## 2021-03-11 DIAGNOSIS — Z8632 Personal history of gestational diabetes: Secondary | ICD-10-CM | POA: Diagnosis not present

## 2021-03-11 DIAGNOSIS — Z98891 History of uterine scar from previous surgery: Secondary | ICD-10-CM | POA: Diagnosis not present

## 2021-03-11 DIAGNOSIS — I10 Essential (primary) hypertension: Secondary | ICD-10-CM

## 2021-03-11 DIAGNOSIS — O0990 Supervision of high risk pregnancy, unspecified, unspecified trimester: Secondary | ICD-10-CM | POA: Insufficient documentation

## 2021-03-11 DIAGNOSIS — E559 Vitamin D deficiency, unspecified: Secondary | ICD-10-CM

## 2021-03-11 MED ORDER — ASPIRIN EC 81 MG PO TBEC: 81.0000 mg | DELAYED_RELEASE_TABLET | Freq: Every day | ORAL | 11 refills | Status: DC

## 2021-03-11 MED ORDER — PROMETHAZINE HCL 25 MG PO TABS: 25.0000 mg | ORAL_TABLET | Freq: Four times a day (QID) | ORAL | 2 refills | Status: DC | PRN

## 2021-03-11 NOTE — Patient Instructions (Signed)
Second Trimester of Pregnancy  The second trimester of pregnancy is from week 13 through week 27. This is months 4 through 6 of pregnancy. The second trimester is often a time when you feel your best. Your body has adjusted to being pregnant, and you begin to feelbetter physically. During the second trimester: Morning sickness has lessened or stopped completely. You may have more energy. You may have an increase in appetite. The second trimester is also a time when the unborn baby (fetus) is growing rapidly. At the end of the sixth month, the fetus may be up to 12 inches long and weigh about 1 pounds. You will likely begin to feel the baby move (quickening) between 16 and 20 weeks of pregnancy. Body changes during your second trimester Your body continues to go through many changes during your second trimester.The changes vary and generally return to normal after the baby is born. Physical changes Your weight will continue to increase. You will notice your lower abdomen bulging out. You may begin to get stretch marks on your hips, abdomen, and breasts. Your breasts will continue to grow and to become tender. Dark spots or blotches (chloasma or mask of pregnancy) may develop on your face. A dark line from your belly button to the pubic area (linea nigra) may appear. You may have changes in your hair. These can include thickening of your hair, rapid growth, and changes in texture. Some people also have hair loss during or after pregnancy, or hair that feels dry or thin. Health changes You may develop headaches. You may have heartburn. You may develop constipation. You may develop hemorrhoids or swollen, bulging veins (varicose veins). Your gums may bleed and may be sensitive to brushing and flossing. You may urinate more often because the fetus is pressing on your bladder. You may have back pain. This is caused by: Weight gain. Pregnancy hormones that are relaxing the joints in your  pelvis. A shift in weight and the muscles that support your balance. Follow these instructions at home: Medicines Follow your health care provider's instructions regarding medicine use. Specific medicines may be either safe or unsafe to take during pregnancy. Do not take any medicines unless approved by your health care provider. Take a prenatal vitamin that contains at least 600 micrograms (mcg) of folic acid. Eating and drinking Eat a healthy diet that includes fresh fruits and vegetables, whole grains, good sources of protein such as meat, eggs, or tofu, and low-fat dairy products. Avoid raw meat and unpasteurized juice, milk, and cheese. These carry germs that can harm you and your baby. You may need to take these actions to prevent or treat constipation: Drink enough fluid to keep your urine pale yellow. Eat foods that are high in fiber, such as beans, whole grains, and fresh fruits and vegetables. Limit foods that are high in fat and processed sugars, such as fried or sweet foods. Activity Exercise only as directed by your health care provider. Most people can continue their usual exercise routine during pregnancy. Try to exercise for 30 minutes at least 5 days a week. Stop exercising if you develop contractions in your uterus. Stop exercising if you develop pain or cramping in the lower abdomen or lower back. Avoid exercising if it is very hot or humid or if you are at a high altitude. Avoid heavy lifting. If you choose to, you may have sex unless your health care provider tells you not to. Relieving pain and discomfort Wear a supportive bra to  prevent discomfort from breast tenderness. Take warm sitz baths to soothe any pain or discomfort caused by hemorrhoids. Use hemorrhoid cream if your health care provider approves. Rest with your legs raised (elevated) if you have leg cramps or low back pain. If you develop varicose veins: Wear support hose as told by your health care  provider. Elevate your feet for 15 minutes, 3-4 times a day. Limit salt in your diet. Safety Wear your seat belt at all times when driving or riding in a car. Talk with your health care provider if someone is verbally or physically abusive to you. Lifestyle Do not use hot tubs, steam rooms, or saunas. Do not douche. Do not use tampons or scented sanitary pads. Avoid cat litter boxes and soil used by cats. These carry germs that can cause birth defects in the baby and possibly loss of the fetus by miscarriage or stillbirth. Do not use herbal remedies, alcohol, illegal drugs, or medicines that are not approved by your health care provider. Chemicals in these products can harm your baby. Do not use any products that contain nicotine or tobacco, such as cigarettes, e-cigarettes, and chewing tobacco. If you need help quitting, ask your health care provider. General instructions During a routine prenatal visit, your health care provider will do a physical exam and other tests. He or she will also discuss your overall health. Keep all follow-up visits. This is important. Ask your health care provider for a referral to a local prenatal education class. Ask for help if you have counseling or nutritional needs during pregnancy. Your health care provider can offer advice or refer you to specialists for help with various needs. Where to find more information American Pregnancy Association: americanpregnancy.[place] and Gynecologists: PoolDevices.com.pt Office on [employer]: KeywordPortfolios.com.br Contact a health care provider if you have: A headache that does not go away when you take medicine. Vision changes or you see spots in front of your eyes. Mild pelvic cramps, pelvic pressure, or nagging pain in the abdominal area. Persistent nausea, vomiting, or diarrhea. A bad-smelling vaginal discharge or foul-smelling urine. Pain when you  urinate. Sudden or extreme swelling of your face, hands, ankles, feet, or legs. A fever. Get help right away if you: Have fluid leaking from your vagina. Have spotting or bleeding from your vagina. Have severe abdominal cramping or pain. Have difficulty breathing. Have chest pain. Have fainting spells. Have not felt your baby move for the time period told by your health care provider. Have new or increased pain, swelling, or redness in an arm or leg. Summary The second trimester of pregnancy is from week 13 through week 27 (months 4 through 6). Do not use herbal remedies, alcohol, illegal drugs, or medicines that are not approved by your health care provider. Chemicals in these products can harm your baby. Exercise only as directed by your health care provider. Most people can continue their usual exercise routine during pregnancy. Keep all follow-up visits. This is important. This information is not intended to replace advice given to you by your health care provider. Make sure you discuss any questions you have with your healthcare provider. Document Revised: 12/12/2019 Document Reviewed: 10/18/2019 Elsevier Patient Education  2022 [employer].  Contraception Choices Contraception, also called birth control, refers to methods or devices thatprevent pregnancy. Hormonal methods  Contraceptive implant A contraceptive implant is a thin, plastic tube that contains a hormone that prevents pregnancy. It is different from an intrauterine device (IUD). It is inserted  into the upper part of the arm by a health care provider. Implants canbe effective for up to 3 years. Progestin-only injections Progestin-only injections are injections of progestin, a synthetic form of thehormone progesterone. They are given every 3 months by a health care provider. Birth control pills Birth control pills are pills that contain hormones that prevent pregnancy. They must be taken once a day, preferably at the  same time each day. Aprescription is needed to use this method of contraception. Birth control patch The birth control patch contains hormones that prevent pregnancy. It is placed on the skin and must be changed once a week for three weeks and removed on thefourth week. A prescription is needed to use this method of contraception. Vaginal ring A vaginal ring contains hormones that prevent pregnancy. It is placed in the vagina for three weeks and removed on the fourth week. After that, the process is repeated with a new ring. A prescription is needed to use this method ofcontraception. Emergency contraceptive Emergency contraceptives prevent pregnancy after unprotected sex. They come in pill form and can be taken up to 5 days after sex. They work best the sooner they are taken after having sex. Most emergency contraceptives are available without a prescription. This method should not be used as your only form ofbirth control. Barrier methods  Female condom A female condom is a thin sheath that is worn over the penis during sex. Condoms keep sperm from going inside a woman's body. They can be used with a sperm-killing substance (spermicide) to increase their effectiveness. They should be thrown away after one use. Female condom A female condom is a soft, loose-fitting sheath that is put into the vagina before sex. The condom keeps sperm from going inside a woman's body. Theyshould be thrown away after one use. Diaphragm A diaphragm is a soft, dome-shaped barrier. It is inserted into the vagina before sex, along with a spermicide. The diaphragm blocks sperm from entering the uterus, and the spermicide kills sperm. A diaphragm should be left in thevagina for 6-8 hours after sex and removed within 24 hours. A diaphragm is prescribed and fitted by a health care provider. A diaphragm should be replaced every 1-2 years, after giving birth, after gaining more than15 lb (6.8 kg), and after pelvic surgery. Cervical  cap A cervical cap is a round, soft latex or plastic cup that fits over the cervix. It is inserted into the vagina before sex, along with spermicide. It blocks sperm from entering the uterus. The cap should be left in place for 6-8 hours after sex and removed within 48 hours. A cervical cap must be prescribed andfitted by a health care provider. It should be replaced every 2 years. Sponge A sponge is a soft, circular piece of polyurethane foam with spermicide in it. The sponge helps block sperm from entering the uterus, and the spermicide kills sperm. To use it, you make it wet and then insert it into the vagina. It should be inserted before sex, left in for at least 6 hours after sex, and removed andthrown away within 30 hours. Spermicides Spermicides are chemicals that kill or block sperm from entering the cervix and uterus. They can come as a cream, jelly, suppository, foam, or tablet. A spermicide should be inserted into the vagina with an applicator at least 75-44 minutes before sex to allow time for it to work. The process must be repeatedevery time you have sex. Spermicides do not require a prescription. Intrauterine contraception Intrauterine device (  IUD) An IUD is a T-shaped device that is put in a woman's uterus. There are two types: Hormone IUD.This type contains progestin, a synthetic form of the hormone progesterone. This type can stay in place for 3-5 years. Copper IUD.This type is wrapped in copper wire. It can stay in place for 10 years. Permanent methods of contraception Female tubal ligation In this method, a woman's fallopian tubes are sealed, tied, or blocked duringsurgery to prevent eggs from traveling to the uterus. Hysteroscopic sterilization In this method, a small, flexible insert is placed into each fallopian tube. The inserts cause scar tissue to form in the fallopian tubes and block them, so sperm cannot reach an egg. The procedure takes about 3 months to be  effective.Another form of birth control must be used during those 3 months. Female sterilization This is a procedure to tie off the tubes that carry sperm (vasectomy). After the procedure, the man can still ejaculate fluid (semen). Another form of birth control must be used for 3 months after the procedure. Natural planning methods Natural family planning In this method, a couple does not have sex on days when the woman could become pregnant. Calendar method In this method, the woman keeps track of the length of each menstrual cycle, identifies the days when pregnancy can happen, and does not have sex on those days. Ovulation method In this method, a couple avoids sex during ovulation. Symptothermal method This method involves not having sex during ovulation. The woman typically checks for ovulation bywatching changes in her temperature and in the consistency of cervical mucus. Post-ovulation method In this method, a couple waits to have sex until after ovulation. Where to find more information Centers for Disease Control and Prevention: http://www.wolf.info/ Summary Contraception, also called birth control, refers to methods or devices that prevent pregnancy. Hormonal methods of contraception include implants, injections, pills, patches, vaginal rings, and emergency contraceptives. Barrier methods of contraception can include female condoms, female condoms, diaphragms, cervical caps, sponges, and spermicides. There are two types of IUDs (intrauterine devices). An IUD can be put in a woman's uterus to prevent pregnancy for 3-5 years. Permanent sterilization can be done through a procedure for males and females. Natural family planning methods involve nothaving sex on days when the woman could become pregnant. This information is not intended to replace advice given to you by your health care provider. Make sure you discuss any questions you have with your healthcare provider. Document Revised: 12/10/2019  Document Reviewed: 12/10/2019 Elsevier Patient Education  [facility].  Preparing for Vaginal Birth After Cesarean Delivery Vaginal birth after cesarean delivery (VBAC) means giving birth vaginally after previously delivering a baby through a cesarean section (C-section). You and your health care provider will discuss your options and whether you may be agood candidate for VBAC. Types of options After a cesarean birth, your options for future deliveries may include: A scheduled cesarean birth. This is done in a hospital with an operating room. A trial of labor after cesarean. A successful trial of labor results in a vaginal delivery. If it is not successful, you will need to have a cesarean birth. A trial of labor after a cesarean should be attempted in facilities where an emergency cesarean birth can be performed. What are the benefits? The benefits of delivering your baby vaginally instead of by a cesarean birth include: A shorter hospital stay. A faster recovery time. Less pain. Avoiding risks associated with major surgery, such as infection and blood clots. Less blood loss  and less need for donated blood (transfusions). What are the risks? The main risk of attempting a VBAC is that it may fail. This would force yourhealth care provider to deliver your baby by a C-section. Other risks are rare and may include: Tearing (rupture) of the scar from a past cesarean birth. Other risks associated with vaginal deliveries. If a repeat cesarean birth is needed, the risks include: Blood loss. Infection. Blood clot. Damage to surrounding organs. Removal of the uterus (hysterectomy), if it is damaged. Placenta problems in future pregnancies. What should I know about my past cesarean delivery?  It is important to know what type of incision was made in your uterus in a past cesarean birth, such as vertical or transverse. The type of incision can affectthe success of your trial of labor. Who  are the best candidates for VBAC? The best candidates for VBAC are women who: Have had one or two prior cesarean births, and their incision makes it safe to attempt a vaginal delivery. Do not have a scar on their uterus. A scar would make it unsafe to attempt a vaginal delivery. Have not had a tear in the wall of their uterus (uterine rupture). Plan to have more pregnancies. A VBAC is also more likely to be successful: In women who have previously given birth vaginally. When labor starts by itself (spontaneously) before the due date. When is VBAC not an option? As your pregnancy progresses, circumstances may change and you may need to reconsider your options. Your situation may also change as you begin a trial of labor. Your health care provider may not want you to attempt a VBAC if: You have had more than two cesarean births. You are past your due date. Your baby's suspected weight is 8.8 lb (4 kg) or more. The scar on your uterus from a previous cesarean birth makes it unsafe to deliver vaginally. You have a history of a uterine rupture. You have preeclampsia. This is a condition that causes high blood pressure along with other symptoms, such as swelling and headaches. What else should I know about my options? Delivering a baby through a VBAC is similar to having a normal spontaneous vaginal delivery. Therefore, it is safe: To try with twins. For your health care provider to try to turn the baby from a breech position (external cephalic version) during labor. With epidural analgesia for pain relief. Consider where you would like to deliver your baby. VBAC should be attempted in facilities where an emergency cesarean birth can be performed. VBAC is notrecommended for home births. Any changes in your health or your baby's health during your pregnancy may makeit necessary to change your initial decision about VBAC. Questions to ask your health care provider Am I a good candidate for the trial  of labor? What are my chances of a successful vaginal delivery? Is my preferred birth location equipped for a trial of labor? What are my pain management options during a trial of labor? Where to find more information [employer]ians and Gynecologists: www.acog.[place]: www.midwife.org Summary Vaginal birth after cesarean delivery (VBAC) is giving birth vaginally after previously having a baby through a cesarean section (C-section). A trial of labor should be attempted in facilities where emergency cesarean section procedures can be performed. Your health care provider may recommend a repeat cesarean birth if a vaginal delivery is not safe for you. This information is not intended to replace advice given to you by your health care provider.  Make sure you discuss any questions you have with your healthcare provider. Document Revised: 07/03/2020 Document Reviewed: 07/03/2020 Elsevier Patient Education  2022 [employer].

## 2021-03-11 NOTE — Progress Notes (Signed)
Patient complains of nausea along with "sharp shooting" cramps in upper abdomen and sometimes pelvis

## 2021-03-11 NOTE — Progress Notes (Signed)
Subjective:   Morgan Newton is a 35 y.o. [id number] at 71w4dby 12wk [place]ore[place]ing seen today for her first obstetrical visit.  Her obstetrical history is significant for  cHTN, IUFD at 256 wks. Patient does intend to breast feed. Pregnancy history fully reviewed.  Patient reports nausea.  HISTORY: OB History  Gravida Para Term Preterm AB Living  9 3 2 1 5 2   SAB IAB Ectopic Multiple Live Births  2 3 0 0 2    # Outcome Date GA Lbr Len/2nd Weight Sex Delivery Anes PTL Lv  9 Current           8 Term 07/22/12 [redacted]w[redacted]d M CS-LTranv EPI  LIV     Birth Comments: Has superficial right cheek laceration from uterine incision (noted to have a few drops of blood during the minutes of age)     Name: [place],BOY Danaysia     Apgar1: 4 Brent9  7 Preterm 06/02/08 2826w0dM Vag-Spont   FD     Birth Comments: Induced  6 Term 10/29/05 39w61w0dlb 13 oz (3.09 kg) F Vag-Spont  N LIV     Birth Comments: Gest Diabetes  5 IAB           4 IAB           3 SAB           2 SAB           1 IAB              Last pap smear was: Lab Results  Component Value Date   DIAGPAP  02/04/2021    - Negative for intraepithelial lesion or malignancy (NILM)   HPVHStudy [place]teative 02/04/2021     Past Medical History:  Diagnosis Date   Abnormal Pap smear 2012   colpo   Anemia    Asthma    Chlamydia    Depression    Genital herpes    Gonorrhea    Heart murmur    History of IUFD 03/03/2021   Hypertension    PIH   Infection    urinary tract infection   PID (acute pelvic inflammatory disease)    Pregnancy induced hypertension    Trichimoniasis    Past Surgical History:  Procedure Laterality Date   CESAREAN SECTION  07/22/2012   Procedure: CESAREAN SECTION;  Surgeon: BernFrederico Hamman;  [facility];  Service: Obstetrics;  Laterality: N/A;  Primary Cesarean Section    GANGLION CYST EXCISION     MOUTH SURGERY     Family History  Problem Relation Age of Onset   Hypertension Maternal Grandfather     Hypertension Mother    Thyroid disease Mother    Hypertension Maternal Grandmother    Thyroid disease Maternal Grandmother    Asthma Father    Asthma Maternal Aunt    Asthma Paternal Grandmother    Other Neg Hx    Hearing loss Neg Hx    Social History   Tobacco Use   Smoking status: Some Days    Packs/day: 0.25    Types: Cigarettes   Smokeless tobacco: Never  Vaping Use   Vaping Use: Never used  Substance Use Topics   Alcohol use: Yes    Alcohol/week: 3.0 standard drinks    Types: 3 Standard drinks or equivalent per week    Comment: rarely   Drug use: No   Allergies  Allergen Reactions   Penicillins Shortness Of Breath and Palpitations    .Marland KitchenHas patient had a PCN reaction causing immediate rash, facial/tongue/throat swelling, SOB or lightheadedness with hypotension: NO Has patient had a PCN reaction causing severe rash involving mucus membranes or skin necrosis: NO Has patient had a PCN reaction that required hospitalization No Has patient had a PCN reaction occurring within the last 10 years: No If all of the above answers are "NO", then may proceed with Cephalosporin use.    Caffeine Other (See Comments)    Heart murmur   Current Outpatient Medications on File Prior to Visit  Medication Sig Dispense Refill   Prenatal Vit-Fe Fumarate-FA (PREPLUS) 27-1 MG TABS Take 1 tablet by mouth daily. 30 tablet 13   albuterol (VENTOLIN HFA) 108 (90 Base) MCG/ACT inhaler Two (2) inhalations five to twenty (5 to 20) minutes prior to exercise. (Patient not taking: Reported on 03/11/2021) 8 g 0   Blood Pressure Monitoring DEVI 1 each by Does not apply route once a week. (Patient not taking: Reported on 03/11/2021) 1 each 0   cetirizine (ZYRTEC ALLERGY) 10 MG tablet Take 1 tablet (10 mg total) by mouth daily. (Patient not taking: Reported on 03/11/2021) 30 tablet 0   [DISCONTINUED] misoprostol (CYTOTEC) 200 MCG tablet Take 2 tablets by mouth and 2 intravaginally for one dose 4 tablet 0   No  current facility-administered medications on file prior to visit.     Exam   Vitals:   03/11/21 1344  BP: 113/75  Pulse: 85  Weight: 201 lb 6.4 oz (91.4 kg)   Fetal Heart Rate (bpm): 151  System: General: well-developed, well-nourished female in no acute distress   Skin: normal coloration and turgor, no rashes   Neurologic: oriented, normal, negative, normal mood   Extremities: normal strength, tone, and muscle mass, ROM of all joints is normal   HEENT PERRLA, extraocular movement intact and sclera clear, anicteric   Neck supple and no masses   Respiratory:  no respiratory distress      Assessment:   Pregnancy: [id number] Patient Active Problem List   Diagnosis Date Noted   History of cesarean delivery 03/11/2021   Chronic hypertension 03/11/2021   Supervision of high risk pregnancy, antepartum 03/03/2021   History of IUFD 03/03/2021   Visit for routine gyn exam 02/04/2021   DUB (dysfunctional uterine bleeding) 02/04/2021   Positive pregnancy test 02/04/2021   Vitamin D deficiency 10/08/2020     Plan:  1. Supervision of high risk pregnancy, antepartum Initial labs drawn. Continue prenatal vitamins. Genetic Screening discussed, NIPS: ordered. Ultrasound discussed; fetal anatomic survey: already scheduled for 04/13/21. Problem list reviewed and updated. The nature of [place] with multiple MDs and other Advanced Practice Providers was explained to patient; also emphasized that residents, students are part of our team.  2. History of cesarean delivery Op note reviewed, pLTCS for fetal bradycardia into the 60's with Dr. Ruthann Cancer in 2014, she was 9cm/-/-2 at time code cesarean was called Has had two prior vaginal deliveries, live birth at term and 28 wk IUFD She desires to [place]  3. History of IUFD Discharge summary reviewed (dated 12/01/2010, 12:55 in [place] though actually occurred in 2009), delivered IUFD at 28 weeks breech which was  complicated by fetal head entrapment Antenatal testing at 32 weeks  4. Vitamin D deficiency 9.5 in March, s/p high dose supplementation, recheck level today  5. Chronic hypertension Prior visits reviewed, hx consistent with diagnosis of  cHTN Start prenatal ASA, does not need meds at present but low threshold to start Baseline labs today  6. History of pre-eclampsia Start ASA Per review of delivery in 2009 she was readmited four days post partum with severe pre-eclampsia  7. History of gestational diabetes Noted in first pregnancy though does not appear to have been diagnosed in her subsequent ones Check A1c  Routine obstetric precautions reviewed. Return in 4 weeks (on 04/08/2021) for [facility], ob visit.

## 2021-03-12 LAB — CBC/D/PLT+RPR+RH+ABO+RUBIGG...
Antibody Screen: NEGATIVE
Basophils Absolute: 0 10*3/uL (ref 0.0–0.2)
Basos: 0 %
EOS (ABSOLUTE): 0.2 10*3/uL (ref 0.0–0.4)
Eos: 1 %
HCV Ab: <0.1 s/co ratio (ref 0.0–0.9)
HIV Screen 4th Generation wRfx: NONREACTIVE
Hematocrit: 37.3 % (ref 34.0–46.6)
Hemoglobin: 13.1 g/dL (ref 11.1–15.9)
Hepatitis B Surface Ag: NEGATIVE
Immature Grans (Abs): 0.1 10*3/uL (ref 0.0–0.1)
Immature Granulocytes: 1 %
Lymphocytes Absolute: 2.9 10*3/uL (ref 0.7–3.1)
Lymphs: 26 %
MCH: 31.6 pg (ref 26.6–33.0)
MCHC: 35.1 g/dL (ref 31.5–35.7)
MCV: 90 fL (ref 79–97)
Monocytes Absolute: 0.9 10*3/uL (ref 0.1–0.9)
Monocytes: 8 %
Neutrophils Absolute: 6.9 10*3/uL (ref 1.4–7.0)
Neutrophils: 64 %
Platelets: 330 10*3/uL (ref 150–450)
RBC: 4.15 x10E6/uL (ref 3.77–5.28)
RDW: 14.5 % (ref 11.7–15.4)
RPR Ser Ql: NONREACTIVE
Rh Factor: POSITIVE
Rubella Antibodies, IGG: 4.34 index (ref >0.99)
WBC: 10.9 10*3/uL — ABNORMAL HIGH (ref 3.4–10.8)

## 2021-03-12 LAB — COMPREHENSIVE METABOLIC PANEL
ALT: 13 IU/L (ref 0–32)
AST: 14 IU/L (ref 0–40)
Albumin/Globulin Ratio: 1.7 (ref 1.2–2.2)
Albumin: 4.1 g/dL (ref 3.8–4.8)
Alkaline Phosphatase: 56 IU/L (ref 44–121)
BUN/Creatinine Ratio: 8 — ABNORMAL LOW (ref 9–23)
BUN: 5 mg/dL — ABNORMAL LOW (ref 6–20)
Bilirubin Total: <0.2 mg/dL (ref 0.0–1.2)
CO2: 18 mmol/L — ABNORMAL LOW (ref 20–29)
Calcium: 10.2 mg/dL (ref 8.7–10.2)
Chloride: 101 mmol/L (ref 96–106)
Creatinine, Ser: 0.64 mg/dL (ref 0.57–1.00)
Globulin, Total: 2.4 g/dL (ref 1.5–4.5)
Glucose: 67 mg/dL (ref 65–99)
Potassium: 4.4 mmol/L (ref 3.5–5.2)
Sodium: 136 mmol/L (ref 134–144)
Total Protein: 6.5 g/dL (ref 6.0–8.5)
eGFR: 119 mL/min/{1.73_m2} (ref >59)

## 2021-03-12 LAB — PROTEIN / CREATININE RATIO, URINE
Creatinine, Urine: 58.1 mg/dL
Protein, Ur: 5.7 mg/dL
Protein/Creat Ratio: 98 mg/g creat (ref 0–200)

## 2021-03-12 LAB — CERVICOVAGINAL ANCILLARY ONLY
Chlamydia: NEGATIVE
Comment: NEGATIVE
Comment: NORMAL
Neisseria Gonorrhea: NEGATIVE

## 2021-03-12 LAB — HCV INTERPRETATION

## 2021-03-12 LAB — VITAMIN D 25 HYDROXY (VIT D DEFICIENCY, FRACTURES): Vit D, 25-Hydroxy: 30.2 ng/mL (ref 30.0–100.0)

## 2021-03-12 LAB — HEMOGLOBIN A1C
Est. average glucose Bld gHb Est-mCnc: 123 mg/dL
Hgb A1c MFr Bld: 5.9 % — ABNORMAL HIGH (ref 4.8–5.6)

## 2021-03-12 NOTE — Addendum Note (Signed)
Addended by: Clayton Lefort on: 03/12/2021 09:44 AM   Modules accepted: Orders

## 2021-03-13 LAB — CULTURE, OB URINE

## 2021-03-13 LAB — URINE CULTURE, OB REFLEX: Organism ID, Bacteria: NO GROWTH

## 2021-03-16 ENCOUNTER — Encounter: Payer: Self-pay | Admitting: *Deleted

## 2021-03-17 DIAGNOSIS — Z136 Encounter for screening for cardiovascular disorders: Secondary | ICD-10-CM | POA: Diagnosis not present

## 2021-03-21 DIAGNOSIS — M62838 Other muscle spasm: Secondary | ICD-10-CM

## 2021-03-24 MED ORDER — CYCLOBENZAPRINE HCL 5 MG PO TABS: 5.0000 mg | ORAL_TABLET | Freq: Three times a day (TID) | ORAL | 0 refills | Status: DC | PRN

## 2021-03-26 ENCOUNTER — Other Ambulatory Visit: Payer: Self-pay

## 2021-03-26 ENCOUNTER — Other Ambulatory Visit: Payer: Medicaid Other

## 2021-03-26 DIAGNOSIS — Z8632 Personal history of gestational diabetes: Secondary | ICD-10-CM

## 2021-03-26 DIAGNOSIS — O0990 Supervision of high risk pregnancy, unspecified, unspecified trimester: Secondary | ICD-10-CM

## 2021-04-02 ENCOUNTER — Telehealth: Payer: Self-pay

## 2021-04-02 ENCOUNTER — Other Ambulatory Visit: Payer: Self-pay

## 2021-04-02 ENCOUNTER — Other Ambulatory Visit: Payer: Medicaid Other

## 2021-04-02 ENCOUNTER — Ambulatory Visit (INDEPENDENT_AMBULATORY_CARE_PROVIDER_SITE_OTHER): Payer: Medicaid Other

## 2021-04-02 VITALS — BP 125/76 | HR 66

## 2021-04-02 DIAGNOSIS — O0990 Supervision of high risk pregnancy, unspecified, unspecified trimester: Secondary | ICD-10-CM | POA: Diagnosis not present

## 2021-04-02 DIAGNOSIS — Z0130 Encounter for examination of blood pressure without abnormal findings: Secondary | ICD-10-CM

## 2021-04-02 NOTE — Telephone Encounter (Signed)
Call received from [employer] with elevated BP from 04/01/21 at 10:05 PM, no symptoms reported. Per chart review pt is here in office today for 2 HR GTT, will be added to nurse visit schedule.

## 2021-04-02 NOTE — Progress Notes (Signed)
Blood Pressure Check  Here at 75w6dof pregnancy for blood pressure check. Diagnosis of chronic hypertension, no BP medication at this time. Call received from [employer] for elevated BP reading. Pt in office for 2 HR GTT, added on for visit with RN. BP today is 125/76. Denies any s/s of elevated BP. Encouraged pt to continue checking weekly and with any symptoms. Will return for routine prenatal appt on 04/08/21.  RApolonio SchneidersRN 04/02/21

## 2021-04-03 ENCOUNTER — Encounter: Payer: Self-pay | Admitting: Family Medicine

## 2021-04-03 DIAGNOSIS — O24419 Gestational diabetes mellitus in pregnancy, unspecified control: Secondary | ICD-10-CM

## 2021-04-03 HISTORY — DX: Gestational diabetes mellitus in pregnancy, unspecified control: O24.419

## 2021-04-03 LAB — GLUCOSE TOLERANCE, 2 HOURS W/ 1HR
Glucose, 1 hour: 205 mg/dL — ABNORMAL HIGH (ref 65–179)
Glucose, 2 hour: 93 mg/dL (ref 65–152)
Glucose, Fasting: 102 mg/dL — ABNORMAL HIGH (ref 65–91)

## 2021-04-06 ENCOUNTER — Telehealth: Payer: Self-pay | Admitting: Lactation Services

## 2021-04-06 ENCOUNTER — Encounter: Payer: Self-pay | Admitting: General Practice

## 2021-04-06 DIAGNOSIS — O24410 Gestational diabetes mellitus in pregnancy, diet controlled: Secondary | ICD-10-CM

## 2021-04-06 MED ORDER — ACCU-CHEK GUIDE W/DEVICE KIT: 1.0000 | PACK | Freq: Once | 0 refills | Status: AC

## 2021-04-06 MED ORDER — ACCU-CHEK SOFTCLIX LANCETS MISC: 12 refills | Status: DC

## 2021-04-06 MED ORDER — ACCU-CHEK GUIDE VI STRP
ORAL_STRIP | 12 refills | Status: DC
Start: 2021-04-06 — End: 2021-07-14

## 2021-04-06 NOTE — Telephone Encounter (Signed)
Called patient to inform her that 2 hour GTT shows she is GDM.   Message to front desk to schedule for Diabetes Education.   Supplies sent to Pharmacy.   My chart message sent.

## 2021-04-06 NOTE — Telephone Encounter (Signed)
-----   Message from Clarnce Flock, MD sent at 04/03/2021 12:57 PM EDT ----- Abnormal GTT Patient notified by MyChart Clinical staff please place referral to DM educator and send testing supplies

## 2021-04-08 ENCOUNTER — Encounter: Payer: Self-pay | Admitting: Family Medicine

## 2021-04-08 ENCOUNTER — Ambulatory Visit (INDEPENDENT_AMBULATORY_CARE_PROVIDER_SITE_OTHER): Payer: Medicaid Other | Admitting: Family Medicine

## 2021-04-08 ENCOUNTER — Other Ambulatory Visit: Payer: Self-pay

## 2021-04-08 VITALS — BP 120/85 | HR 90 | Wt 201.6 lb

## 2021-04-08 DIAGNOSIS — O0990 Supervision of high risk pregnancy, unspecified, unspecified trimester: Secondary | ICD-10-CM

## 2021-04-08 DIAGNOSIS — R7989 Other specified abnormal findings of blood chemistry: Secondary | ICD-10-CM

## 2021-04-08 DIAGNOSIS — Z8759 Personal history of other complications of pregnancy, childbirth and the puerperium: Secondary | ICD-10-CM

## 2021-04-08 DIAGNOSIS — Z98891 History of uterine scar from previous surgery: Secondary | ICD-10-CM

## 2021-04-08 DIAGNOSIS — O24410 Gestational diabetes mellitus in pregnancy, diet controlled: Secondary | ICD-10-CM

## 2021-04-08 DIAGNOSIS — L299 Pruritus, unspecified: Secondary | ICD-10-CM

## 2021-04-08 DIAGNOSIS — I10 Essential (primary) hypertension: Secondary | ICD-10-CM

## 2021-04-08 MED ORDER — TRIAMCINOLONE ACETONIDE 0.025 % EX CREA: 1.0000 "application " | TOPICAL_CREAM | Freq: Two times a day (BID) | CUTANEOUS | 0 refills | Status: AC

## 2021-04-08 NOTE — Patient Instructions (Signed)

## 2021-04-08 NOTE — Progress Notes (Signed)
PHQ-9 score of 10, declines IBH at this time. Support and education given regarding IBH services for future references.    Altha Harm, CMA

## 2021-04-08 NOTE — Progress Notes (Signed)
   Subjective:  Morgan Newton is a 35 y.o. [phone] at 67w4dbeing seen today for ongoing prenatal care.  She is currently monitored for the following issues for this high-risk pregnancy and has Visit for routine gyn exam; DUB (dysfunctional uterine bleeding); Positive pregnancy test; Supervision of high risk pregnancy, antepartum; History of IUFD; History of cesarean delivery; Chronic hypertension; and GDM (gestational diabetes mellitus) on their problem list.  Patient reports no complaints.  Contractions: Not present. Vag. Bleeding: None.  Movement: Absent. Denies leaking of fluid.   The following portions of the patient's history were reviewed and updated as appropriate: allergies, current medications, past family history, past medical history, past social history, past surgical history and problem list. Problem list updated.  Objective:   Vitals:   04/08/21 1422  BP: 120/85  Pulse: 90  Weight: 201 lb 9.6 oz (91.4 kg)    Fetal Status: Fetal Heart Rate (bpm): 156   Movement: Absent     General:  Alert, oriented and cooperative. Patient is in no acute distress.  Skin: Skin is warm and dry. No rash noted.   Cardiovascular: Normal heart rate noted  Respiratory: Normal respiratory effort, no problems with respiration noted  Abdomen: Soft, gravid, appropriate for gestational age. Pain/Pressure: Present     Pelvic: Vag. Bleeding: None     Cervical exam deferred        Extremities: Normal range of motion.  Edema: None  Mental Status: Normal mood and affect. Normal behavior. Normal judgment and thought content.   Urinalysis:      Assessment and Plan:  Pregnancy: [id number] 123w4d1. Supervision of high risk pregnancy, antepartum BP and FHR normal AFP today  2. Diet controlled gestational diabetes mellitus (GDM) in second trimester Abnormal A1c followed by abnormal 2hr GTT Has DM educator scheduled for 04/22/21  3. Chronic hypertension Normotensive off meds On ASA  4. History  of IUFD At 28 weeks Start antenatal testing at 3235eeks  5. History of cesarean delivery NRFHT>code cesarean, has had two prior vaginal deliveries Desires TOLAC  6. Itching Reports significant palmar and occasional plantar itching in her palms along with peeling skin Does have some peeling and areas of callus Would be very surprised if bile acids are elevated but will check regardless Trial topical steroid cream for itching  Preterm labor symptoms and general obstetric precautions including but not limited to vaginal bleeding, contractions, leaking of fluid and fetal movement were reviewed in detail with the patient. Please refer to After Visit Summary for other counseling recommendations.  Return in 4 weeks (on 05/06/2021).   EcClarnce FlockMD

## 2021-04-09 LAB — COMPREHENSIVE METABOLIC PANEL
ALT: 88 IU/L — ABNORMAL HIGH (ref 0–32)
AST: 64 IU/L — ABNORMAL HIGH (ref 0–40)
Albumin/Globulin Ratio: 1.5 (ref 1.2–2.2)
Albumin: 3.5 g/dL — ABNORMAL LOW (ref 3.8–4.8)
Alkaline Phosphatase: 53 IU/L (ref 44–121)
BUN/Creatinine Ratio: 11 (ref 9–23)
BUN: 7 mg/dL (ref 6–20)
Bilirubin Total: <0.2 mg/dL (ref 0.0–1.2)
CO2: 23 mmol/L (ref 20–29)
Calcium: 9.8 mg/dL (ref 8.7–10.2)
Chloride: 102 mmol/L (ref 96–106)
Creatinine, Ser: 0.63 mg/dL (ref 0.57–1.00)
Globulin, Total: 2.4 g/dL (ref 1.5–4.5)
Glucose: 99 mg/dL (ref 65–99)
Potassium: 4 mmol/L (ref 3.5–5.2)
Sodium: 137 mmol/L (ref 134–144)
Total Protein: 5.9 g/dL — ABNORMAL LOW (ref 6.0–8.5)
eGFR: 119 mL/min/{1.73_m2} (ref >59)

## 2021-04-09 LAB — BILE ACIDS, TOTAL: Bile Acids Total: 3.8 umol/L (ref 0.0–10.0)

## 2021-04-10 DIAGNOSIS — R7989 Other specified abnormal findings of blood chemistry: Secondary | ICD-10-CM | POA: Insufficient documentation

## 2021-04-10 LAB — AFP, SERUM, OPEN SPINA BIFIDA
AFP MoM: 1.28
AFP Value: 56.2 ng/mL
Gest. Age on Collection Date: 18.4 weeks
Maternal Age At EDD: 34.6 yr
OSBR Risk 1 IN: 10000
Test Results:: NEGATIVE
Weight: 202 [lb_av]

## 2021-04-10 NOTE — Addendum Note (Signed)
Addended by: Clayton Lefort on: 04/10/2021 12:16 PM   Modules accepted: Orders

## 2021-04-13 ENCOUNTER — Ambulatory Visit (HOSPITAL_BASED_OUTPATIENT_CLINIC_OR_DEPARTMENT_OTHER): Payer: Medicaid Other | Admitting: Obstetrics

## 2021-04-13 ENCOUNTER — Ambulatory Visit: Payer: Medicaid Other | Admitting: *Deleted

## 2021-04-13 ENCOUNTER — Telehealth: Payer: Self-pay

## 2021-04-13 ENCOUNTER — Other Ambulatory Visit: Payer: Self-pay

## 2021-04-13 ENCOUNTER — Ambulatory Visit: Payer: Medicaid Other | Attending: Family Medicine

## 2021-04-13 VITALS — BP 116/67 | HR 97

## 2021-04-13 DIAGNOSIS — Z3A19 19 weeks gestation of pregnancy: Secondary | ICD-10-CM | POA: Diagnosis not present

## 2021-04-13 DIAGNOSIS — O24410 Gestational diabetes mellitus in pregnancy, diet controlled: Secondary | ICD-10-CM | POA: Insufficient documentation

## 2021-04-13 DIAGNOSIS — O99212 Obesity complicating pregnancy, second trimester: Secondary | ICD-10-CM | POA: Diagnosis not present

## 2021-04-13 DIAGNOSIS — O10919 Unspecified pre-existing hypertension complicating pregnancy, unspecified trimester: Secondary | ICD-10-CM

## 2021-04-13 DIAGNOSIS — O09292 Supervision of pregnancy with other poor reproductive or obstetric history, second trimester: Secondary | ICD-10-CM | POA: Diagnosis not present

## 2021-04-13 DIAGNOSIS — O0990 Supervision of high risk pregnancy, unspecified, unspecified trimester: Secondary | ICD-10-CM | POA: Diagnosis not present

## 2021-04-13 NOTE — Telephone Encounter (Signed)
-----   Message from Clarnce Flock, MD sent at 04/10/2021 12:16 PM EDT ----- Normal AFP, PL updated Reported palmar itching at last visit, despite low suspicion given low GA bile acids and LFTs obtained. Bile acids normal but LFT's unexpectedly elevated. Bilirubin was normal. Unclear significance, has had neg Hep B and C serologies this pregnancy. Will plan to repeat at next visit, obtain RUQ [place] in the meantime.

## 2021-04-13 NOTE — Progress Notes (Signed)
MFM Note  Morgan Newton was seen for a detailed fetal anatomy scan due to maternal obesity with a BMI of 38, chronic hypertension that is not treated with any medications, and early onset gestational diabetes.  The patient failed her 2-hour glucose tolerance test last month.  She is waiting for her glucometer and has not been started on any medications for treatment of gestational diabetes.  Her hemoglobin A1c was 5.9% last month.  Her P/C ratio did not indicate significant proteinuria.   She also has a history of a prior IUFD at 48 weeks.  The etiology of the IUFD remains undetermined.  She denies any problems in her current pregnancy.    She had a cell free DNA test earlier in her pregnancy which indicated a low risk for trisomy 71, 83, and 13. A female fetus is predicted.   She was informed that the fetal growth and amniotic fluid level were appropriate for her gestational age.   There were no obvious fetal anomalies noted on today's ultrasound exam.  However, the views of the fetal anatomy were limited today due to the fetal position.  The patient was informed that anomalies may be missed due to technical limitations. If the fetus is in a suboptimal position or maternal habitus is increased, visualization of the fetus in the maternal uterus may be impaired.  The following were discussed today:  Early onset gestational diabetes and pregnancy  The implications and management of diabetes in pregnancy was discussed in detail with the patient.  She was advised to start monitoring her fingersticks 4 times daily (fasting and 2 hours after each meal) once she receives her glucometer.   She was advised that our goals for her fingerstick values are fasting values of 90-95 or less and two-hour postprandial values of 120 or less.  Should the majority of her fingerstick results be above these values, she may have to be started on metformin or insulin to help her achieve better glycemic control. The  patient was advised that getting her fingerstick values as close to these goals as possible would provide her with the most optimal obstetrical outcome.  Due to gestational diabetes, we will continue to follow her with monthly growth ultrasounds.    She was referred to [facility] pediatric cardiology for a fetal echocardiogram.  The increased risk of polyhydramnios, fetal macrosomia, and preeclampsia associated with diabetes was also discussed.    To decrease her risk of superimposed preeclampsia, she should continue taking a daily baby aspirin (81 mg daily) for preeclampsia prophylaxis.   Prior fetal demise at 28 weeks  Due to her history of a prior demise, we will continue to follow her with monthly ultrasounds.    Weekly fetal testing should be started at around 30 weeks.  To decrease her risk of another adverse pregnancy outcome, delivery may be considered at around 37 to 38 weeks.  The patient stated that all of her questions have been answered to her satisfaction.    We will continue to follow her closely with you throughout her pregnancy.  A total of 30 minutes was spent counseling and coordinating the care for this patient.  Greater than 50% of the time was spent in direct face-to-face contact.   Recommendations:  Monthly growth scan Fetal echocardiogram Monitor fingerstick values Daily baby aspirin for preeclampsia prophylaxis Start weekly fetal testing at 30 weeks Consider delivery at between 37 to 38 weeks

## 2021-04-13 NOTE — Telephone Encounter (Signed)
Call placed to pt. Pt given results and recommendations per Dr Dione Plover. Pt verbalized understanding and agreeable to plan of care.  Pt has RUQ [place] scheduled on 10/3@ 10am. Pt agreeable to date and time of appt.   Colletta Maryland, RN

## 2021-04-14 ENCOUNTER — Other Ambulatory Visit: Payer: Self-pay | Admitting: *Deleted

## 2021-04-14 DIAGNOSIS — O34219 Maternal care for unspecified type scar from previous cesarean delivery: Secondary | ICD-10-CM

## 2021-04-14 DIAGNOSIS — Z8759 Personal history of other complications of pregnancy, childbirth and the puerperium: Secondary | ICD-10-CM

## 2021-04-14 DIAGNOSIS — O10919 Unspecified pre-existing hypertension complicating pregnancy, unspecified trimester: Secondary | ICD-10-CM

## 2021-04-14 DIAGNOSIS — O24419 Gestational diabetes mellitus in pregnancy, unspecified control: Secondary | ICD-10-CM

## 2021-04-16 ENCOUNTER — Other Ambulatory Visit: Payer: Self-pay | Admitting: Lactation Services

## 2021-04-16 MED ORDER — ACCU-CHEK GUIDE W/DEVICE KIT: 1.0000 | PACK | Freq: Once | 0 refills | Status: AC

## 2021-04-20 ENCOUNTER — Other Ambulatory Visit: Payer: Self-pay

## 2021-04-20 ENCOUNTER — Ambulatory Visit (HOSPITAL_COMMUNITY)
Admission: RE | Admit: 2021-04-20 | Discharge: 2021-04-20 | Disposition: A | Discharge status: Home / Self Care | Payer: Medicaid Other | Source: Ambulatory Visit | Attending: Family Medicine | Admitting: Family Medicine

## 2021-04-20 DIAGNOSIS — R7989 Other specified abnormal findings of blood chemistry: Secondary | ICD-10-CM | POA: Insufficient documentation

## 2021-04-22 ENCOUNTER — Other Ambulatory Visit: Payer: Self-pay

## 2021-04-22 ENCOUNTER — Encounter: Payer: Self-pay | Admitting: Registered"

## 2021-04-22 ENCOUNTER — Encounter: Payer: Medicaid Other | Attending: Family Medicine | Admitting: Registered"

## 2021-04-22 DIAGNOSIS — O24419 Gestational diabetes mellitus in pregnancy, unspecified control: Secondary | ICD-10-CM

## 2021-04-22 DIAGNOSIS — Z3A00 Weeks of gestation of pregnancy not specified: Secondary | ICD-10-CM | POA: Diagnosis not present

## 2021-04-22 DIAGNOSIS — O24410 Gestational diabetes mellitus in pregnancy, diet controlled: Secondary | ICD-10-CM | POA: Diagnosis not present

## 2021-04-22 NOTE — Progress Notes (Signed)
Patient was seen on 04/22/2021 for Gestational Diabetes self-management class at [facility]. The following learning objectives were met by the patient during this course:  States the definition of Gestational Diabetes States why dietary management is important in controlling blood glucose Describes the effects each nutrient has on blood glucose levels Demonstrates ability to create a balanced meal plan Demonstrates carbohydrate counting  States when to check blood glucose levels Demonstrates proper blood glucose monitoring techniques States the effect of stress and exercise on blood glucose levels States the importance of limiting caffeine and abstaining from alcohol and smoking  Blood glucose monitor given: Accu-che[employer] Me Lot #[id number] Exp: 05/27/2022 CBG: 86 mg/dL    Patient instructed to monitor glucose levels: FBS: 60 - <95; 1 hour: <140; 2 hour: <120  Patient received handouts: Nutrition Diabetes and Pregnancy, including carb counting list  Patient will be seen for follow-up as needed.

## 2021-04-29 ENCOUNTER — Telehealth: Payer: Self-pay | Admitting: Obstetrics and Gynecology

## 2021-04-29 NOTE — Telephone Encounter (Signed)
OB Telephone note  140/82 BP called by [employer]   Patient called at [phone] and phone rang but no answer  Message sent to clinic to f/u with patient  Durene Romans MD Attending [facility] 04/29/2021 Time: [phone]

## 2021-04-30 ENCOUNTER — Telehealth: Payer: Self-pay

## 2021-04-30 NOTE — Telephone Encounter (Signed)
-----   Message from Aletha Halim, MD sent at 04/29/2021 11:52 PM EDT ----- Regarding: call pt to follow up about BP See telephone note from tonight

## 2021-04-30 NOTE — Telephone Encounter (Signed)
Call placed to pt. Pt states was running around last night getting ready to go out of town and took BP. Pt states feels fine and denies any s/s of elevated BP. Pt unable to take BP right now again while on the phone since in the car going out of town. Pt advised when settles to send in BP to mychart for update. Pt agreeable to plan of care.  Colletta Maryland, RN

## 2021-05-06 ENCOUNTER — Encounter: Payer: Medicaid Other | Admitting: Family Medicine

## 2021-05-07 ENCOUNTER — Ambulatory Visit (INDEPENDENT_AMBULATORY_CARE_PROVIDER_SITE_OTHER): Payer: Medicaid Other | Admitting: Family Medicine

## 2021-05-07 ENCOUNTER — Encounter: Payer: Self-pay | Admitting: Family Medicine

## 2021-05-07 ENCOUNTER — Other Ambulatory Visit: Payer: Self-pay

## 2021-05-07 VITALS — BP 127/78 | HR 88 | Wt 200.2 lb

## 2021-05-07 DIAGNOSIS — O24410 Gestational diabetes mellitus in pregnancy, diet controlled: Secondary | ICD-10-CM

## 2021-05-07 DIAGNOSIS — O0990 Supervision of high risk pregnancy, unspecified, unspecified trimester: Secondary | ICD-10-CM | POA: Diagnosis not present

## 2021-05-07 DIAGNOSIS — Z98891 History of uterine scar from previous surgery: Secondary | ICD-10-CM

## 2021-05-07 DIAGNOSIS — I10 Essential (primary) hypertension: Secondary | ICD-10-CM

## 2021-05-07 DIAGNOSIS — L298 Other pruritus: Secondary | ICD-10-CM

## 2021-05-07 DIAGNOSIS — Z8759 Personal history of other complications of pregnancy, childbirth and the puerperium: Secondary | ICD-10-CM

## 2021-05-07 DIAGNOSIS — R7989 Other specified abnormal findings of blood chemistry: Secondary | ICD-10-CM

## 2021-05-07 NOTE — Patient Instructions (Signed)

## 2021-05-07 NOTE — Progress Notes (Signed)
Fasting sugars 87-97 2 hours 120-140  Concerns with hands peeling and cracking, did receive 12 day ointment. Completed but still having issues

## 2021-05-07 NOTE — Progress Notes (Signed)
   Subjective:  Morgan Newton is a 35 y.o. [phone] at 38w5dbeing seen today for ongoing prenatal care.  She is currently monitored for the following issues for this high-risk pregnancy and has Visit for routine gyn exam; DUB (dysfunctional uterine bleeding); Positive pregnancy test; Supervision of high risk pregnancy, antepartum; History of IUFD; History of cesarean delivery; Chronic hypertension; GDM (gestational diabetes mellitus); and Elevated LFTs on their problem list.  Patient reports no complaints.  Contractions: Not present. Vag. Bleeding: None.  Movement: Present. Denies leaking of fluid.   The following portions of the patient's history were reviewed and updated as appropriate: allergies, current medications, past family history, past medical history, past social history, past surgical history and problem list. Problem list updated.  Objective:   Vitals:   05/07/21 1037  BP: 127/78  Pulse: 88  Weight: 200 lb 3.2 oz (90.8 kg)    Fetal Status: Fetal Heart Rate (bpm): 151   Movement: Present     General:  Alert, oriented and cooperative. Patient is in no acute distress.  Skin: Skin is warm and dry. No rash noted.   Cardiovascular: Normal heart rate noted  Respiratory: Normal respiratory effort, no problems with respiration noted  Abdomen: Soft, gravid, appropriate for gestational age. Pain/Pressure: Absent     Pelvic: Vag. Bleeding: None     Cervical exam deferred        Extremities: Normal range of motion.  Edema: None  Mental Status: Normal mood and affect. Normal behavior. Normal judgment and thought content.   Urinalysis:      Assessment and Plan:  Pregnancy: [id number] 241w5d1. Supervision of high risk pregnancy, antepartum BP and FHR normal  2. Diet controlled gestational diabetes mellitus (GDM) in second trimester Abnormal A1c followed by failed early 2hr GTT Did not bring log Recall fastings always <100 2hr PP are running 120-140's Will have her return in  two weeks to review log Discussed association with IUFD with uncontrolled GDM Noted after visit to not yet have a fetal echo scheduled, repeat order placed  3. Chronic hypertension Well controlled, no meds On ASA  4. History of IUFD At 28 weeks Start antenatal testing at 30 weeks per MFM Delivery between 37-38 weeks  5. History of cesarean delivery VD x2>stat CS for NRFHT Desires TOLAC, consent next visit  6. Elevated LFTs Reported palmar itching in palms Trialed topical steroids but this was not effective Bile acids normal but LFT's suprisingly mildly elevated Recheck bile acids and LFTs today Reports she was taking 1500 mg once or twice a day for a week prior to that check though, may explain LFTs Referral to Dermatology Possible tinea nigra vs lichen planus  Preterm labor symptoms and general obstetric precautions including but not limited to vaginal bleeding, contractions, leaking of fluid and fetal movement were reviewed in detail with the patient. Please refer to After Visit Summary for other counseling recommendations.  Return in 4 weeks (on 06/04/2021) [facility] needs [facility].   EcClarnce FlockMD

## 2021-05-08 LAB — HEPATIC FUNCTION PANEL
ALT: 45 IU/L — ABNORMAL HIGH (ref 0–32)
AST: 36 IU/L (ref 0–40)
Albumin: 3.5 g/dL — ABNORMAL LOW (ref 3.8–4.8)
Alkaline Phosphatase: 62 IU/L (ref 44–121)
Bilirubin Total: <0.2 mg/dL (ref 0.0–1.2)
Bilirubin, Direct: <0.1 mg/dL (ref 0.00–0.40)
Total Protein: 5.8 g/dL — ABNORMAL LOW (ref 6.0–8.5)

## 2021-05-08 LAB — BILE ACIDS, TOTAL: Bile Acids Total: <1 umol/L (ref 0.0–10.0)

## 2021-05-12 ENCOUNTER — Encounter: Payer: Self-pay | Admitting: *Deleted

## 2021-05-12 ENCOUNTER — Ambulatory Visit: Payer: Medicaid Other | Attending: Obstetrics

## 2021-05-12 ENCOUNTER — Ambulatory Visit: Payer: Medicaid Other | Admitting: *Deleted

## 2021-05-12 ENCOUNTER — Other Ambulatory Visit: Payer: Self-pay | Admitting: *Deleted

## 2021-05-12 ENCOUNTER — Other Ambulatory Visit: Payer: Self-pay

## 2021-05-12 VITALS — BP 127/75

## 2021-05-12 DIAGNOSIS — Z8759 Personal history of other complications of pregnancy, childbirth and the puerperium: Secondary | ICD-10-CM

## 2021-05-12 DIAGNOSIS — O09292 Supervision of pregnancy with other poor reproductive or obstetric history, second trimester: Secondary | ICD-10-CM

## 2021-05-12 DIAGNOSIS — O34219 Maternal care for unspecified type scar from previous cesarean delivery: Secondary | ICD-10-CM | POA: Insufficient documentation

## 2021-05-12 DIAGNOSIS — O24419 Gestational diabetes mellitus in pregnancy, unspecified control: Secondary | ICD-10-CM

## 2021-05-12 DIAGNOSIS — O10012 Pre-existing essential hypertension complicating pregnancy, second trimester: Secondary | ICD-10-CM

## 2021-05-12 DIAGNOSIS — O10919 Unspecified pre-existing hypertension complicating pregnancy, unspecified trimester: Secondary | ICD-10-CM | POA: Diagnosis not present

## 2021-05-12 DIAGNOSIS — Z3A23 23 weeks gestation of pregnancy: Secondary | ICD-10-CM

## 2021-05-12 DIAGNOSIS — O10912 Unspecified pre-existing hypertension complicating pregnancy, second trimester: Secondary | ICD-10-CM

## 2021-05-15 DIAGNOSIS — Q211 Atrial septal defect, unspecified: Secondary | ICD-10-CM | POA: Diagnosis not present

## 2021-05-15 DIAGNOSIS — Q250 Patent ductus arteriosus: Secondary | ICD-10-CM | POA: Diagnosis not present

## 2021-05-22 ENCOUNTER — Telehealth (INDEPENDENT_AMBULATORY_CARE_PROVIDER_SITE_OTHER): Payer: Medicaid Other | Admitting: Family Medicine

## 2021-05-22 ENCOUNTER — Encounter: Payer: Self-pay | Admitting: Family Medicine

## 2021-05-22 VITALS — BP 130/83 | HR 104

## 2021-05-22 DIAGNOSIS — O24410 Gestational diabetes mellitus in pregnancy, diet controlled: Secondary | ICD-10-CM

## 2021-05-22 DIAGNOSIS — O34219 Maternal care for unspecified type scar from previous cesarean delivery: Secondary | ICD-10-CM

## 2021-05-22 DIAGNOSIS — O0990 Supervision of high risk pregnancy, unspecified, unspecified trimester: Secondary | ICD-10-CM

## 2021-05-22 DIAGNOSIS — O09292 Supervision of pregnancy with other poor reproductive or obstetric history, second trimester: Secondary | ICD-10-CM

## 2021-05-22 DIAGNOSIS — Z98891 History of uterine scar from previous surgery: Secondary | ICD-10-CM

## 2021-05-22 DIAGNOSIS — I10 Essential (primary) hypertension: Secondary | ICD-10-CM

## 2021-05-22 DIAGNOSIS — R7989 Other specified abnormal findings of blood chemistry: Secondary | ICD-10-CM

## 2021-05-22 DIAGNOSIS — Z8759 Personal history of other complications of pregnancy, childbirth and the puerperium: Secondary | ICD-10-CM

## 2021-05-22 DIAGNOSIS — O10912 Unspecified pre-existing hypertension complicating pregnancy, second trimester: Secondary | ICD-10-CM

## 2021-05-22 DIAGNOSIS — O99891 Other specified diseases and conditions complicating pregnancy: Secondary | ICD-10-CM

## 2021-05-22 DIAGNOSIS — Z3A24 24 weeks gestation of pregnancy: Secondary | ICD-10-CM

## 2021-05-22 MED ORDER — METFORMIN HCL 500 MG PO TABS: 500.0000 mg | ORAL_TABLET | Freq: Two times a day (BID) | ORAL | 5 refills | Status: DC

## 2021-05-22 NOTE — Patient Instructions (Signed)

## 2021-05-22 NOTE — Progress Notes (Signed)
I connected with Morgan Newton 05/22/21 at  9:35 AM EDT by: MyChart video and verified that I am speaking with the correct person using two identifiers.  Patient is located at home and provider is located at [facility].     The purpose of this virtual visit is to provide medical care while limiting exposure to the novel coronavirus. I discussed the limitations, risks, security and privacy concerns of performing an evaluation and management service by MyChart video and the availability of in person appointments. I also discussed with the patient that there may be a patient responsible charge related to this service. By engaging in this virtual visit, you consent to the provision of healthcare.  Additionally, you authorize for your insurance to be billed for the services provided during this visit.  The patient expressed understanding and agreed to proceed.  The following staff members participated in the virtual visit:  Clarnce Flock, MD/MPH Attending Family Medicine Physician, [place] [facility]     PRENATAL VISIT NOTE  Subjective:  Morgan Newton is a 35 y.o. [id number] at [redacted]w[redacted]d for phone visit for ongoing prenatal care.  She is currently monitored for the following issues for this high-risk pregnancy and has Visit for routine gyn exam; DUB (dysfunctional uterine bleeding); Positive pregnancy test; Supervision of high risk pregnancy, antepartum; History of IUFD; History of cesarean delivery; Chronic hypertension; GDM (gestational diabetes mellitus); and Elevated LFTs on their problem list.  Patient reports no complaints.  Contractions: Irregular. Vag. Bleeding: None.  Movement: Present. Denies leaking of fluid.   The following portions of the patient's history were reviewed and updated as appropriate: allergies, current medications, past family history, past medical history, past social history, past surgical history and problem  list.   Objective:   Vitals:   05/22/21 0932  BP: 130/83  Pulse: (!) 104   Self-Obtained  Fetal Status:     Movement: Present     Assessment and Plan:  Pregnancy: [id number] 242w6d. Supervision of high risk pregnancy, antepartum BP normal Good fetal movement  2. Diet controlled gestational diabetes mellitus (GDM) in second trimester Diet controlled Log reviewed, largely uncontrolled though most fastings are low 100's and post prandials only in 120-130's Start metformin 50034mID Last growth US [place]/25/22, EFW 34%, 577g, AFI normal Fetal echo done 10/28 and was normal  3. Chronic hypertension No meds On ASA  4. History of IUFD At 28 weeks Start weekly testing at 30 weeks  5. History of cesarean delivery VD x2>CS>desires TOLAC, sign consent next visit  6. Elevated LFTs Likely secondary to excess tylenol Also had palmar itching, reports still present, has had bile acids neg x2 Referred to Derm last visit as well Recheck LFT's next visit  Preterm labor symptoms and general obstetric precautions including but not limited to vaginal bleeding, contractions, leaking of fluid and fetal movement were reviewed in detail with the patient.  Return in 4 weeks (on 06/19/2021) [facility].  Future Appointments  Date Time Provider DepRavenel1/21/2022  3:00 PM [facility]/21/2022  3:15 PM [facility] US2 [facility]US WMCRiesel  Time spent on virtual visit: 15 minutes  MatClarnce FlockD

## 2021-06-08 ENCOUNTER — Other Ambulatory Visit: Payer: Self-pay | Admitting: *Deleted

## 2021-06-08 ENCOUNTER — Other Ambulatory Visit: Payer: Self-pay

## 2021-06-08 ENCOUNTER — Encounter: Payer: Self-pay | Admitting: *Deleted

## 2021-06-08 ENCOUNTER — Ambulatory Visit: Payer: Medicaid Other | Admitting: *Deleted

## 2021-06-08 ENCOUNTER — Ambulatory Visit: Payer: Medicaid Other | Attending: Obstetrics

## 2021-06-08 VITALS — BP 134/61 | HR 71

## 2021-06-08 DIAGNOSIS — O10912 Unspecified pre-existing hypertension complicating pregnancy, second trimester: Secondary | ICD-10-CM | POA: Diagnosis not present

## 2021-06-08 DIAGNOSIS — O10012 Pre-existing essential hypertension complicating pregnancy, second trimester: Secondary | ICD-10-CM | POA: Diagnosis not present

## 2021-06-08 DIAGNOSIS — Z8759 Personal history of other complications of pregnancy, childbirth and the puerperium: Secondary | ICD-10-CM | POA: Insufficient documentation

## 2021-06-08 DIAGNOSIS — O24415 Gestational diabetes mellitus in pregnancy, controlled by oral hypoglycemic drugs: Secondary | ICD-10-CM

## 2021-06-08 DIAGNOSIS — Z3A27 27 weeks gestation of pregnancy: Secondary | ICD-10-CM | POA: Diagnosis not present

## 2021-06-08 DIAGNOSIS — O24419 Gestational diabetes mellitus in pregnancy, unspecified control: Secondary | ICD-10-CM | POA: Diagnosis not present

## 2021-06-08 DIAGNOSIS — Z6838 Body mass index (BMI) 38.0-38.9, adult: Secondary | ICD-10-CM

## 2021-06-08 DIAGNOSIS — O99212 Obesity complicating pregnancy, second trimester: Secondary | ICD-10-CM

## 2021-06-29 ENCOUNTER — Other Ambulatory Visit: Payer: Self-pay

## 2021-06-29 ENCOUNTER — Encounter: Payer: Self-pay | Admitting: *Deleted

## 2021-06-29 ENCOUNTER — Ambulatory Visit: Payer: Medicaid Other | Attending: Obstetrics | Admitting: Obstetrics

## 2021-06-29 ENCOUNTER — Ambulatory Visit: Payer: Medicaid Other | Admitting: *Deleted

## 2021-06-29 VITALS — BP 122/60 | HR 96

## 2021-06-29 DIAGNOSIS — Z3A00 Weeks of gestation of pregnancy not specified: Secondary | ICD-10-CM | POA: Insufficient documentation

## 2021-06-29 DIAGNOSIS — O24415 Gestational diabetes mellitus in pregnancy, controlled by oral hypoglycemic drugs: Secondary | ICD-10-CM

## 2021-06-29 DIAGNOSIS — Z3689 Encounter for other specified antenatal screening: Secondary | ICD-10-CM | POA: Insufficient documentation

## 2021-06-29 DIAGNOSIS — O09293 Supervision of pregnancy with other poor reproductive or obstetric history, third trimester: Secondary | ICD-10-CM | POA: Diagnosis not present

## 2021-06-29 DIAGNOSIS — I10 Essential (primary) hypertension: Secondary | ICD-10-CM | POA: Diagnosis not present

## 2021-06-29 DIAGNOSIS — Z8759 Personal history of other complications of pregnancy, childbirth and the puerperium: Secondary | ICD-10-CM | POA: Diagnosis not present

## 2021-06-29 DIAGNOSIS — O09299 Supervision of pregnancy with other poor reproductive or obstetric history, unspecified trimester: Secondary | ICD-10-CM | POA: Insufficient documentation

## 2021-06-29 NOTE — Procedures (Signed)
Morgan Newton [birth year] [redacted]w[redacted]d Fetus A Non-Stress Test Interpretation for 06/29/21  Indication:  history of 28 wk IUFD  Fetal Heart Rate A Mode: External Baseline Rate (A): 135 bpm Variability: Moderate Accelerations: 15 x 15  Uterine Activity Mode: Toco Contraction Frequency (min): UI Resting Tone Palpated: Relaxed Resting Time: Adequate  Interpretation (Fetal Testing) Nonstress Test Interpretation: Reactive Overall Impression: Reassuring for gestational age Comments: tracing reviewed by Dr. FAnnamaria Boots

## 2021-06-30 ENCOUNTER — Telehealth: Payer: Self-pay | Admitting: Lactation Services

## 2021-06-30 ENCOUNTER — Encounter: Payer: Self-pay | Admitting: Family Medicine

## 2021-06-30 DIAGNOSIS — O0990 Supervision of high risk pregnancy, unspecified, unspecified trimester: Secondary | ICD-10-CM

## 2021-06-30 NOTE — Telephone Encounter (Signed)
Called and spoke with patient. She was informed she has a follow up appointment scheduled in the office for 12/21 at 2:15.   Offered for patient to come in for lab work this week and she is agreeable. She would like to come in on Friday at 9 am. Labs ordered and placed on lab schedule.

## 2021-07-01 ENCOUNTER — Other Ambulatory Visit: Payer: Self-pay

## 2021-07-01 ENCOUNTER — Ambulatory Visit (INDEPENDENT_AMBULATORY_CARE_PROVIDER_SITE_OTHER): Payer: Medicaid Other

## 2021-07-01 ENCOUNTER — Encounter: Payer: Self-pay | Admitting: Emergency Medicine

## 2021-07-01 ENCOUNTER — Ambulatory Visit
Admission: EM | Admit: 2021-07-01 | Discharge: 2021-07-01 | Disposition: A | Discharge status: Home / Self Care | Payer: Medicaid Other | Attending: Physician Assistant | Admitting: Physician Assistant

## 2021-07-01 DIAGNOSIS — S99922A Unspecified injury of left foot, initial encounter: Secondary | ICD-10-CM | POA: Diagnosis not present

## 2021-07-01 DIAGNOSIS — M79675 Pain in left toe(s): Secondary | ICD-10-CM

## 2021-07-01 NOTE — ED Triage Notes (Signed)
Last night stubbed toe, heard a pop, has had pain/swelling ever since. Left foot

## 2021-07-02 NOTE — ED Provider Notes (Signed)
[facility]    CSN: [id number] Arrival date & time: 07/01/21  1859      History   Chief Complaint Chief Complaint  Patient presents with   Foot Injury    HPI Morgan Newton is a 35 y.o. female.   Patient here today for evaluation of injury to her left foot that occurred last night.  She reports that she accidentally hit her fifth toe and heard a cracking sound is not sure if it came from her toe or her ankle.  She does not report any ankle pain but is continued to have some pain to her fifth toe and lateral foot.  She denies any numbness.  Weightbearing makes pain worse.  The history is provided by the patient.  Foot Injury Associated symptoms: no fever    Past Medical History:  Diagnosis Date   Abnormal Pap smear 2012   colpo   Anemia    Asthma    Chlamydia    Depression    GDM (gestational diabetes mellitus) 04/03/2021   Genital herpes    Gonorrhea    Heart murmur    History of IUFD 03/03/2021   Hypertension    PIH   Infection    urinary tract infection   PID (acute pelvic inflammatory disease)    Pregnancy induced hypertension    Trichimoniasis     Patient Active Problem List   Diagnosis Date Noted   Elevated LFTs 04/10/2021   GDM (gestational diabetes mellitus) 04/03/2021   History of cesarean delivery 03/11/2021   Chronic hypertension 03/11/2021   Supervision of high risk pregnancy, antepartum 03/03/2021   History of IUFD 03/03/2021   Visit for routine gyn exam 02/04/2021   DUB (dysfunctional uterine bleeding) 02/04/2021   Positive pregnancy test 02/04/2021    Past Surgical History:  Procedure Laterality Date   CESAREAN SECTION  07/22/2012   Procedure: CESAREAN SECTION;  Surgeon: Frederico Hamman, MD;  Location: [facility];  Service: Obstetrics;  Laterality: N/A;  Primary Cesarean Section    GANGLION CYST EXCISION     MOUTH SURGERY      OB History     Gravida  9   Para  3   Term  2   Preterm  1   AB  5   Living  2       SAB  2   IAB  3   Ectopic      Multiple      Live Births  2            Home Medications    Prior to Admission medications   Medication Sig Start Date End Date Taking? Authorizing Provider  Accu-Chek Softclix Lancets lancets To check blood sugar 4 times a day. Fasting in the morning and 2 hours after the first bite of breakfast, lunch and dinner. 04/06/21   Clarnce Flock, MD  albuterol (VENTOLIN HFA) 108 (90 Base) MCG/ACT inhaler Two (2) inhalations five to twenty (5 to 20) minutes prior to exercise. 09/09/20   Camillia Herter, NP  aspirin EC 81 MG tablet Take 1 tablet (81 mg total) by mouth daily. Swallow whole. 03/11/21   Clarnce Flock, MD  Blood Pressure Monitoring DEVI 1 each by Does not apply route once a week. 03/03/21   Clarnce Flock, MD  glucose blood (ACCU-CHEK GUIDE) test strip To check blood sugars 4 times a day. Fasting and 2 hours after first bite of breakfast, lunch and dinner. 04/06/21  Clarnce Flock, MD  metFORMIN (GLUCOPHAGE) 500 MG tablet Take 1 tablet (500 mg total) by mouth 2 (two) times daily with a meal. 05/22/21   Clarnce Flock, MD  Prenatal Vit-Fe Fumarate-FA (PREPLUS) 27-1 MG TABS Take 1 tablet by mouth daily. 02/04/21   Chancy Milroy, MD  misoprostol (CYTOTEC) 200 MCG tablet Take 2 tablets by mouth and 2 intravaginally for one dose 04/14/20 07/24/20  Woodroe Mode, MD    Family History Family History  Problem Relation Age of Onset   Hypertension Maternal Grandfather    Hypertension Mother    Thyroid disease Mother    Hypertension Maternal Grandmother    Thyroid disease Maternal Grandmother    Asthma Father    Asthma Maternal Aunt    Asthma Paternal Grandmother    Other Neg Hx    Hearing loss Neg Hx     Social History Social History   Tobacco Use   Smoking status: Some Days    Packs/day: 0.25    Types: Cigarettes   Smokeless tobacco: Never  Vaping Use   Vaping Use: Never used  Substance Use Topics   Alcohol use: Not  Currently    Alcohol/week: 3.0 standard drinks    Types: 3 Standard drinks or equivalent per week    Comment: not during preg   Drug use: No     Allergies   Penicillins and Caffeine   Review of Systems Review of Systems  Constitutional:  Negative for chills and fever.  Eyes:  Negative for discharge and redness.  Respiratory:  Negative for shortness of breath.   Gastrointestinal:  Negative for abdominal pain, nausea and vomiting.  Genitourinary:  Positive for vaginal bleeding and vaginal discharge.  Musculoskeletal:  Positive for arthralgias.  Skin:  Negative for color change and wound.  Neurological:  Negative for numbness.    Physical Exam Triage Vital Signs ED Triage Vitals  Enc Vitals Group     BP 07/01/21 1928 120/72     Pulse Rate 07/01/21 1928 (!) 103     Resp 07/01/21 1928 16     Temp 07/01/21 1928 98.7 F (37.1 C)     Temp Source 07/01/21 1928 Oral     SpO2 07/01/21 1928 98 %     Weight --      Height --      Head Circumference --      Peak Flow --      Pain Score 07/01/21 1929 7     Pain Loc --      Pain Edu? --      Excl. in [place]? --    No data found.  Updated Vital Signs BP 120/72 (BP Location: Left Arm)    Pulse (!) 103    Temp 98.7 F (37.1 C) (Oral)    Resp 16    LMP 08/11/2020 (Exact Date)    SpO2 98%   Physical Exam Vitals and nursing note reviewed.  Constitutional:      General: She is not in acute distress.    Appearance: Normal appearance. She is not ill-appearing.  HENT:     Head: Normocephalic and atraumatic.  Eyes:     Conjunctiva/sclera: Conjunctivae normal.  Cardiovascular:     Rate and Rhythm: Normal rate.  Pulmonary:     Effort: Pulmonary effort is normal.  Musculoskeletal:     Comments: Decreased range of motion of fifth toe to the left foot  Neurological:     Mental Status: She is alert.  Comments: Gross sensation intact to left fifth toe  Psychiatric:        Mood and Affect: Mood normal.        Behavior: Behavior  normal.        Thought Content: Thought content normal.     UC Treatments / Results  Labs (all labs ordered are listed, but only abnormal results are displayed) Labs Reviewed - No data to display  EKG   Radiology DG Foot Complete Left  Result Date: 07/01/2021 CLINICAL DATA:  Caught left fifth toe on a piece of furniture yesterday no EXAM: LEFT FOOT - COMPLETE 3+ VIEW COMPARISON:  None. FINDINGS: There is no evidence of fracture or dislocation. There is no evidence of arthropathy or other focal bone abnormality. Plantar calcaneal enthesophyte. Soft tissues are unremarkable. IMPRESSION: Negative. Electronically Signed   By: Merilyn Baba M.D.   On: 07/01/2021 19:49    Procedures Procedures (including critical care time)  Medications Ordered in UC Medications - No data to display  Initial Impression / Assessment and Plan / UC Course  I have reviewed the triage vital signs and the nursing notes.  Pertinent labs & imaging results that were available during my care of the patient were reviewed by me and considered in my medical decision making (see chart for details).    Xray without fracture. Recommended tylenol if needed for pain, post op shoe provided. Recommend follow up if no improvement over the next week.   Final Clinical Impressions(s) / UC Diagnoses   Final diagnoses:  Foot injury, left, initial encounter   Discharge Instructions   None    ED Prescriptions   None    PDMP not reviewed this encounter.   Francene Finders, PA-C 07/02/21 [phone]

## 2021-07-03 ENCOUNTER — Other Ambulatory Visit: Payer: Medicaid Other

## 2021-07-03 ENCOUNTER — Other Ambulatory Visit: Payer: Self-pay

## 2021-07-03 DIAGNOSIS — O0990 Supervision of high risk pregnancy, unspecified, unspecified trimester: Secondary | ICD-10-CM

## 2021-07-05 LAB — BILE ACIDS, TOTAL: Bile Acids Total: 3.5 umol/L (ref 0.0–10.0)

## 2021-07-05 LAB — CBC
Hematocrit: 34.9 % (ref 34.0–46.6)
Hemoglobin: 11.7 g/dL (ref 11.1–15.9)
MCH: 31.2 pg (ref 26.6–33.0)
MCHC: 33.5 g/dL (ref 31.5–35.7)
MCV: 93 fL (ref 79–97)
Platelets: 293 10*3/uL (ref 150–450)
RBC: 3.75 x10E6/uL — ABNORMAL LOW (ref 3.77–5.28)
RDW: 13 % (ref 11.7–15.4)
WBC: 7.7 10*3/uL (ref 3.4–10.8)

## 2021-07-05 LAB — COMPREHENSIVE METABOLIC PANEL
ALT: 19 IU/L (ref 0–32)
AST: 17 IU/L (ref 0–40)
Albumin/Globulin Ratio: 1.5 (ref 1.2–2.2)
Albumin: 3.3 g/dL — ABNORMAL LOW (ref 3.8–4.8)
Alkaline Phosphatase: 109 IU/L (ref 44–121)
BUN/Creatinine Ratio: 7 — ABNORMAL LOW (ref 9–23)
BUN: 4 mg/dL — ABNORMAL LOW (ref 6–20)
Bilirubin Total: 0.2 mg/dL (ref 0.0–1.2)
CO2: 20 mmol/L (ref 20–29)
Calcium: 9.4 mg/dL (ref 8.7–10.2)
Chloride: 105 mmol/L (ref 96–106)
Creatinine, Ser: 0.57 mg/dL (ref 0.57–1.00)
Globulin, Total: 2.2 g/dL (ref 1.5–4.5)
Glucose: 85 mg/dL (ref 70–99)
Potassium: 4.7 mmol/L (ref 3.5–5.2)
Sodium: 138 mmol/L (ref 134–144)
Total Protein: 5.5 g/dL — ABNORMAL LOW (ref 6.0–8.5)
eGFR: 122 mL/min/{1.73_m2} (ref >59)

## 2021-07-05 LAB — HIV ANTIBODY (ROUTINE TESTING W REFLEX): HIV Screen 4th Generation wRfx: NONREACTIVE

## 2021-07-05 LAB — RPR: RPR Ser Ql: NONREACTIVE

## 2021-07-06 ENCOUNTER — Ambulatory Visit (HOSPITAL_BASED_OUTPATIENT_CLINIC_OR_DEPARTMENT_OTHER): Payer: Medicaid Other | Admitting: *Deleted

## 2021-07-06 ENCOUNTER — Ambulatory Visit: Payer: Medicaid Other | Attending: Obstetrics

## 2021-07-06 ENCOUNTER — Other Ambulatory Visit: Payer: Self-pay | Admitting: *Deleted

## 2021-07-06 ENCOUNTER — Other Ambulatory Visit: Payer: Self-pay

## 2021-07-06 ENCOUNTER — Ambulatory Visit: Payer: Medicaid Other | Admitting: *Deleted

## 2021-07-06 VITALS — BP 123/69 | HR 75

## 2021-07-06 DIAGNOSIS — Z3A31 31 weeks gestation of pregnancy: Secondary | ICD-10-CM | POA: Diagnosis not present

## 2021-07-06 DIAGNOSIS — O10013 Pre-existing essential hypertension complicating pregnancy, third trimester: Secondary | ICD-10-CM | POA: Diagnosis not present

## 2021-07-06 DIAGNOSIS — O364XX0 Maternal care for intrauterine death, not applicable or unspecified: Secondary | ICD-10-CM

## 2021-07-06 DIAGNOSIS — Z6838 Body mass index (BMI) 38.0-38.9, adult: Secondary | ICD-10-CM | POA: Insufficient documentation

## 2021-07-06 DIAGNOSIS — O10913 Unspecified pre-existing hypertension complicating pregnancy, third trimester: Secondary | ICD-10-CM | POA: Insufficient documentation

## 2021-07-06 DIAGNOSIS — O99213 Obesity complicating pregnancy, third trimester: Secondary | ICD-10-CM

## 2021-07-06 DIAGNOSIS — Z8759 Personal history of other complications of pregnancy, childbirth and the puerperium: Secondary | ICD-10-CM | POA: Diagnosis not present

## 2021-07-06 DIAGNOSIS — O09293 Supervision of pregnancy with other poor reproductive or obstetric history, third trimester: Secondary | ICD-10-CM

## 2021-07-06 DIAGNOSIS — O24419 Gestational diabetes mellitus in pregnancy, unspecified control: Secondary | ICD-10-CM

## 2021-07-06 DIAGNOSIS — O24415 Gestational diabetes mellitus in pregnancy, controlled by oral hypoglycemic drugs: Secondary | ICD-10-CM | POA: Insufficient documentation

## 2021-07-06 NOTE — Procedures (Signed)
Morgan Newton 07-28-1985 [redacted]w[redacted]d Fetus A Non-Stress Test Interpretation for 07/06/21  Indication: Gestational Diabetes medication controlled, hx IUFD, BMI 38, CHTN  Fetal Heart Rate A Mode: External Baseline Rate (A): 135 bpm Variability: Moderate Accelerations: 15 x 15 Decelerations: None Multiple birth?: Newton  Uterine Activity Mode: Palpation, Toco Contraction Frequency (min): none Resting Tone Palpated: Relaxed  Interpretation (Fetal Testing) Nonstress Test Interpretation: Reactive Overall Impression: Reassuring for gestational age Comments: Dr. BGertie Exonreviewed tracing

## 2021-07-08 ENCOUNTER — Encounter: Payer: Self-pay | Admitting: Radiology

## 2021-07-08 ENCOUNTER — Encounter: Payer: Medicaid Other | Admitting: Family Medicine

## 2021-07-10 ENCOUNTER — Encounter: Payer: Medicaid Other | Admitting: Obstetrics and Gynecology

## 2021-07-14 ENCOUNTER — Ambulatory Visit: Payer: Medicaid Other | Attending: Obstetrics

## 2021-07-14 ENCOUNTER — Other Ambulatory Visit: Payer: Self-pay

## 2021-07-14 ENCOUNTER — Ambulatory Visit: Payer: Medicaid Other | Admitting: *Deleted

## 2021-07-14 VITALS — BP 127/69 | HR 100

## 2021-07-14 DIAGNOSIS — Z8759 Personal history of other complications of pregnancy, childbirth and the puerperium: Secondary | ICD-10-CM | POA: Diagnosis not present

## 2021-07-14 DIAGNOSIS — O24415 Gestational diabetes mellitus in pregnancy, controlled by oral hypoglycemic drugs: Secondary | ICD-10-CM | POA: Insufficient documentation

## 2021-07-14 DIAGNOSIS — O34219 Maternal care for unspecified type scar from previous cesarean delivery: Secondary | ICD-10-CM | POA: Diagnosis not present

## 2021-07-14 DIAGNOSIS — Z3A32 32 weeks gestation of pregnancy: Secondary | ICD-10-CM

## 2021-07-14 DIAGNOSIS — Z6838 Body mass index (BMI) 38.0-38.9, adult: Secondary | ICD-10-CM | POA: Diagnosis not present

## 2021-07-14 DIAGNOSIS — O09293 Supervision of pregnancy with other poor reproductive or obstetric history, third trimester: Secondary | ICD-10-CM

## 2021-07-14 DIAGNOSIS — O10013 Pre-existing essential hypertension complicating pregnancy, third trimester: Secondary | ICD-10-CM | POA: Diagnosis not present

## 2021-07-19 NOTE — L&D Delivery Note (Signed)
OB/[facility]N Faculty Practice Delivery Note  Morgan Newton is a 36 y.o. [id number] s/p VBAC at [redacted]w[redacted]d She was admitted for IOL for preE without SF and A2GDM.   ROM: 18h 031mith clear fluid GBS Status: GBS neg Maximum Maternal Temperature: 98.9  Labor Progress: Presented for TOLAC, cervix was closed and was started on IV pitocin, she eventually had a balloon placed. Once her balloon was out she was AROMed and had an IUPC and progressed to complete.  Delivery Date/Time: 2337 on 08/22/2021 Delivery: Called to room and patient was complete and pushing. Fetal bradycardia to the 90s noted but patient with excellent pushing effort. Head delivered OA. No nuchal cord present. Shoulder and body delivered in usual fashion. Infant without spontaneous cry, placed on mother's abdomen, dried and stimulated. Cord clamped x 2 immediately by Dr. DaCy Blamer Cord gas was collected and cord pH was 7.21 Cord blood drawn. Placenta delivered spontaneously with gentle cord traction. Fundus firm with massage and Pitocin. Labia, perineum, vagina, and cervix inspected and found to have a bleeding periurethral that was hemostatic with one interrupted suture.  Placenta: Intact, 3V cord, L&D Complications: None Lacerations: Periurethral EBL: 175 Analgesia: epidural  Infant: Female   APGARs 8,9   weight pending  AnRenard MatterMD, MPH OB Fellow, [facility] 08/23/2021, 12:24 AM

## 2021-07-22 ENCOUNTER — Other Ambulatory Visit: Payer: Medicaid Other

## 2021-07-22 ENCOUNTER — Ambulatory Visit (INDEPENDENT_AMBULATORY_CARE_PROVIDER_SITE_OTHER): Payer: Medicaid Other | Admitting: Family Medicine

## 2021-07-22 ENCOUNTER — Other Ambulatory Visit: Payer: Self-pay

## 2021-07-22 ENCOUNTER — Ambulatory Visit: Payer: Medicaid Other | Admitting: *Deleted

## 2021-07-22 ENCOUNTER — Encounter: Payer: Self-pay | Admitting: *Deleted

## 2021-07-22 ENCOUNTER — Ambulatory Visit (INDEPENDENT_AMBULATORY_CARE_PROVIDER_SITE_OTHER): Payer: Medicaid Other

## 2021-07-22 ENCOUNTER — Encounter: Payer: Medicaid Other | Admitting: Family Medicine

## 2021-07-22 VITALS — BP 134/79 | HR 81 | Wt 201.8 lb

## 2021-07-22 DIAGNOSIS — O24415 Gestational diabetes mellitus in pregnancy, controlled by oral hypoglycemic drugs: Secondary | ICD-10-CM

## 2021-07-22 DIAGNOSIS — Z98891 History of uterine scar from previous surgery: Secondary | ICD-10-CM

## 2021-07-22 DIAGNOSIS — Z8759 Personal history of other complications of pregnancy, childbirth and the puerperium: Secondary | ICD-10-CM

## 2021-07-22 DIAGNOSIS — Z23 Encounter for immunization: Secondary | ICD-10-CM | POA: Diagnosis not present

## 2021-07-22 DIAGNOSIS — I10 Essential (primary) hypertension: Secondary | ICD-10-CM

## 2021-07-22 DIAGNOSIS — O0990 Supervision of high risk pregnancy, unspecified, unspecified trimester: Secondary | ICD-10-CM

## 2021-07-22 DIAGNOSIS — R7989 Other specified abnormal findings of blood chemistry: Secondary | ICD-10-CM

## 2021-07-22 NOTE — Addendum Note (Signed)
Addended by: Georgia Lopes on: 07/22/2021 11:55 AM   Modules accepted: Orders

## 2021-07-22 NOTE — Progress Notes (Signed)
Pt has not been checking CBG's regularly - states checked twice in last week.  She has been having increased nausea - especially @ night which wakes her up from sleep. She also has decreased appetite.

## 2021-07-22 NOTE — Progress Notes (Addendum)
° °  Subjective:  Morgan Newton is a 36 y.o. [phone] at 79w4dbeing seen today for ongoing prenatal care.  She is currently monitored for the following issues for this high-risk pregnancy and has Visit for routine gyn exam; DUB (dysfunctional uterine bleeding); Positive pregnancy test; Supervision of high risk pregnancy, antepartum; History of IUFD; History of cesarean delivery; Chronic hypertension; GDM (gestational diabetes mellitus); and Elevated LFTs on their problem list.  Patient reports no complaints.   .  .   . Denies leaking of fluid. No bleeding. Intermittent contractions.  The following portions of the patient's history were reviewed and updated as appropriate: allergies, current medications, past family history, past medical history, past social history, past surgical history and problem list. Problem list updated.  Objective:  BP 134/79    Pulse 81    Wt 201 lb 12.8 oz (91.5 kg)    LMP 08/11/2020 (Exact Date)    BMI 40.76 kg/m   Fetal Status:        Presentation: Vertex  General:  Alert, oriented and cooperative. Patient is in no acute distress.  Skin: Skin is warm and dry. No rash noted.   Cardiovascular: Normal heart rate noted  Respiratory: Normal respiratory effort, no problems with respiration noted  Abdomen: Soft, gravid, appropriate for gestational age.       Pelvic:       Cervical exam deferred        Extremities: Normal range of motion.     Mental Status: Normal mood and affect. Normal behavior. Normal judgment and thought content.   Urinalysis:      Assessment and Plan:  Pregnancy: [id number] 337w4d1. History of IUFD Weekly testing, BPP today 8/10 - [place] B[place] W/NONSTRESS; Future  2. Gestational diabetes mellitus (GDM) in third trimester controlled on oral hypoglycemic drug Not checking sugars regularly Reports she is not taking metformin 50026mID, advised to resume Last growth [place]/19/22, EFW 18%, AFI 13.9 cm Following w MFM Stressed importance of  checking sugars, risk of stillbirth with uncontrolled GDM, etc. Will see back in one week to review sugar log - [place] BPP W/NONSTRESS; Future  3. Supervision of high risk pregnancy, antepartum BP normal NST reactive Offered TDaP today, accepts Would like BTL, papers signed today  4. Chronic hypertension Well controlled, no meds  5. History of cesarean delivery Desires TOLAC, consent reviewed and signed  6. Elevated LFTs Resolved, likely secondary to excess tylenol ingestion  Preterm labor symptoms and general obstetric precautions including but not limited to vaginal bleeding, contractions, leaking of fluid and fetal movement were reviewed in detail with the patient. Please refer to After Visit Summary for other counseling recommendations.  Return in about 1 week (around 07/29/2021) [facility], needs MD, review sugar log.   EckClarnce FlockD

## 2021-07-22 NOTE — Progress Notes (Deleted)
° °  Subjective:  Morgan Newton is a 36 y.o. [phone] at 82w4dbeing seen today for ongoing prenatal care.  She is currently monitored for the following issues for this high-risk pregnancy and has Visit for routine gyn exam; DUB (dysfunctional uterine bleeding); Positive pregnancy test; Supervision of high risk pregnancy, antepartum; History of IUFD; History of cesarean delivery; Chronic hypertension; GDM (gestational diabetes mellitus); and Elevated LFTs on their problem list.  Patient reports {[id number]}.   .  .   . Denies leaking of fluid.   The following portions of the patient's history were reviewed and updated as appropriate: allergies, current medications, past family history, past medical history, past social history, past surgical history and problem list. Problem list updated.  Objective:  There were no vitals filed for this visit.  Fetal Status:        Presentation: Vertex  General:  Alert, oriented and cooperative. Patient is in no acute distress.  Skin: Skin is warm and dry. No rash noted.   Cardiovascular: Normal heart rate noted  Respiratory: Normal respiratory effort, no problems with respiration noted  Abdomen: Soft, gravid, appropriate for gestational age.       Pelvic:       Cervical exam deferred        Extremities: Normal range of motion.     Mental Status: Normal mood and affect. Normal behavior. Normal judgment and thought content.   Urinalysis:      Assessment and Plan:  Pregnancy: [id number] 362w4d1. History of IUFD Weekly testing, BPP today *** - [place] B[place] W/NONSTRESS; Future  2. Gestational diabetes mellitus (GDM) in third trimester controlled on oral hypoglycemic drug Not checking sugars regularly Reports she is *** taking metformin 50060mID Last growth [place]/19/22, EFW 18%, AFI 13.9 cm Following w MFM Stressed importance of checking sugars, risk of stillbirth with uncontrolled GDM, etc. Will see back in one week to review sugar log*** - [place]  BPP W/NONSTRESS; Future  3. Supervision of high risk pregnancy, antepartum BP normal NST *** Offered TDaP today, ***  4. Chronic hypertension Well controlled, no meds  5. History of cesarean delivery Desires TOLAC, ***  6. Elevated LFTs Resolved, likely secondary to excess tylenol ingestion  Preterm labor symptoms and general obstetric precautions including but not limited to vaginal bleeding, contractions, leaking of fluid and fetal movement were reviewed in detail with the patient. Please refer to After Visit Summary for other counseling recommendations.  Return in about 1 week (around 07/29/2021) [facility], needs MD, review sugar log.   EckClarnce FlockD

## 2021-07-23 DIAGNOSIS — F17200 Nicotine dependence, unspecified, uncomplicated: Secondary | ICD-10-CM | POA: Diagnosis not present

## 2021-07-23 DIAGNOSIS — F411 Generalized anxiety disorder: Secondary | ICD-10-CM | POA: Diagnosis not present

## 2021-07-25 ENCOUNTER — Encounter: Payer: Self-pay | Admitting: Family Medicine

## 2021-07-26 ENCOUNTER — Encounter: Payer: Self-pay | Admitting: Radiology

## 2021-07-29 ENCOUNTER — Ambulatory Visit (INDEPENDENT_AMBULATORY_CARE_PROVIDER_SITE_OTHER): Payer: Medicaid Other

## 2021-07-29 ENCOUNTER — Inpatient Hospital Stay (HOSPITAL_COMMUNITY)
Admission: AD | Admit: 2021-07-29 | Discharge: 2021-07-29 | Disposition: A | Discharge status: Home / Self Care | Payer: Medicaid Other | Attending: Obstetrics & Gynecology | Admitting: Obstetrics & Gynecology

## 2021-07-29 ENCOUNTER — Other Ambulatory Visit: Payer: Self-pay

## 2021-07-29 ENCOUNTER — Encounter: Payer: Medicaid Other | Admitting: Obstetrics and Gynecology

## 2021-07-29 ENCOUNTER — Other Ambulatory Visit: Payer: Medicaid Other

## 2021-07-29 ENCOUNTER — Ambulatory Visit: Payer: Medicaid Other | Admitting: *Deleted

## 2021-07-29 ENCOUNTER — Encounter (HOSPITAL_COMMUNITY): Payer: Self-pay | Admitting: Obstetrics & Gynecology

## 2021-07-29 ENCOUNTER — Encounter: Payer: Self-pay | Admitting: Family Medicine

## 2021-07-29 ENCOUNTER — Ambulatory Visit (INDEPENDENT_AMBULATORY_CARE_PROVIDER_SITE_OTHER): Payer: Medicaid Other | Admitting: Family Medicine

## 2021-07-29 VITALS — BP 145/93 | HR 95 | Wt 200.7 lb

## 2021-07-29 DIAGNOSIS — I10 Essential (primary) hypertension: Secondary | ICD-10-CM

## 2021-07-29 DIAGNOSIS — Z3A34 34 weeks gestation of pregnancy: Secondary | ICD-10-CM | POA: Insufficient documentation

## 2021-07-29 DIAGNOSIS — R7989 Other specified abnormal findings of blood chemistry: Secondary | ICD-10-CM

## 2021-07-29 DIAGNOSIS — O4703 False labor before 37 completed weeks of gestation, third trimester: Secondary | ICD-10-CM | POA: Diagnosis not present

## 2021-07-29 DIAGNOSIS — O0990 Supervision of high risk pregnancy, unspecified, unspecified trimester: Secondary | ICD-10-CM

## 2021-07-29 DIAGNOSIS — O10913 Unspecified pre-existing hypertension complicating pregnancy, third trimester: Secondary | ICD-10-CM | POA: Diagnosis not present

## 2021-07-29 DIAGNOSIS — Z8759 Personal history of other complications of pregnancy, childbirth and the puerperium: Secondary | ICD-10-CM

## 2021-07-29 DIAGNOSIS — O09293 Supervision of pregnancy with other poor reproductive or obstetric history, third trimester: Secondary | ICD-10-CM | POA: Diagnosis not present

## 2021-07-29 DIAGNOSIS — O99340 Other mental disorders complicating pregnancy, unspecified trimester: Secondary | ICD-10-CM

## 2021-07-29 DIAGNOSIS — O479 False labor, unspecified: Secondary | ICD-10-CM

## 2021-07-29 DIAGNOSIS — O24415 Gestational diabetes mellitus in pregnancy, controlled by oral hypoglycemic drugs: Secondary | ICD-10-CM

## 2021-07-29 DIAGNOSIS — F419 Anxiety disorder, unspecified: Secondary | ICD-10-CM

## 2021-07-29 DIAGNOSIS — Z98891 History of uterine scar from previous surgery: Secondary | ICD-10-CM

## 2021-07-29 LAB — POCT URINALYSIS DIP (DEVICE)
Bilirubin Urine: NEGATIVE
Glucose, UA: NEGATIVE mg/dL
Ketones, ur: 80 mg/dL — AB
Leukocytes,Ua: NEGATIVE
Nitrite: NEGATIVE
Protein, ur: 30 mg/dL — AB
Specific Gravity, Urine: 1.015 (ref 1.005–1.030)
Urobilinogen, UA: 0.2 mg/dL (ref 0.0–1.0)
pH: 7 (ref 5.0–8.0)

## 2021-07-29 LAB — CBC
HCT: 34.6 % — ABNORMAL LOW (ref 36.0–46.0)
Hemoglobin: 12 g/dL (ref 12.0–15.0)
MCH: 31.5 pg (ref 26.0–34.0)
MCHC: 34.7 g/dL (ref 30.0–36.0)
MCV: 90.8 fL (ref 80.0–100.0)
Platelets: 323 10*3/uL (ref 150–400)
RBC: 3.81 MIL/uL — ABNORMAL LOW (ref 3.87–5.11)
RDW: 12.8 % (ref 11.5–15.5)
WBC: 9.1 10*3/uL (ref 4.0–10.5)
nRBC: 0 % (ref 0.0–0.2)

## 2021-07-29 LAB — COMPREHENSIVE METABOLIC PANEL
ALT: 20 U/L (ref 0–44)
AST: 24 U/L (ref 15–41)
Albumin: 2.7 g/dL — ABNORMAL LOW (ref 3.5–5.0)
Alkaline Phosphatase: 124 U/L (ref 38–126)
Anion gap: 10 (ref 5–15)
BUN: <5 mg/dL — ABNORMAL LOW (ref 6–20)
CO2: 21 mmol/L — ABNORMAL LOW (ref 22–32)
Calcium: 8.9 mg/dL (ref 8.9–10.3)
Chloride: 103 mmol/L (ref 98–111)
Creatinine, Ser: 0.56 mg/dL (ref 0.44–1.00)
GFR, Estimated: >60 mL/min (ref >60)
Glucose, Bld: 70 mg/dL (ref 70–99)
Potassium: 3.5 mmol/L (ref 3.5–5.1)
Sodium: 134 mmol/L — ABNORMAL LOW (ref 135–145)
Total Bilirubin: 0.7 mg/dL (ref 0.3–1.2)
Total Protein: 6 g/dL — ABNORMAL LOW (ref 6.5–8.1)

## 2021-07-29 LAB — PROTEIN / CREATININE RATIO, URINE
Creatinine, Urine: 69.63 mg/dL
Protein Creatinine Ratio: 0.62 mg/mg{Cre} — ABNORMAL HIGH (ref 0.00–0.15)
Total Protein, Urine: 43 mg/dL

## 2021-07-29 MED ORDER — NIFEDIPINE ER OSMOTIC RELEASE 30 MG PO TB24: 30.0000 mg | ORAL_TABLET | Freq: Every day | ORAL | 11 refills | Status: DC

## 2021-07-29 NOTE — Patient Instructions (Signed)
Third Trimester of Pregnancy The third trimester of pregnancy is from week 28 through week 46. This is months 7 through 9. The third trimester is a time when the unborn baby (fetus) is growing rapidly. At the end of the ninth month, the fetus is about 20 inches long and weighs 6-10 pounds. Body changes during your third trimester During the third trimester, your body will continue to go through many changes. The changes vary and generally return to normal after your baby is born. Physical changes Your weight will continue to increase. You can expect to gain 25-35 pounds (11-16 kg) by the end of the pregnancy if you begin pregnancy at a normal weight. If you are underweight, you can expect to gain 28-40 lb (about 13-18 kg), and if you are overweight, you can expect to gain 15-25 lb (about 7-11 kg). You may begin to get stretch marks on your hips, abdomen, and breasts. Your breasts will continue to grow and may hurt. A yellow fluid (colostrum) may leak from your breasts. This is the first milk you are producing for your baby. You may have changes in your hair. These can include thickening of your hair, rapid growth, and changes in texture. Some people also have hair loss during or after pregnancy, or hair that feels dry or thin. Your belly button may stick out. You may notice more swelling in your hands, face, or ankles. Health changes You may have heartburn. You may have constipation. You may develop hemorrhoids. You may develop swollen, bulging veins (varicose veins) in your legs. You may have increased body aches in the pelvis, back, or thighs. This is due to weight gain and increased hormones that are relaxing your joints. You may have increased tingling or numbness in your hands, arms, and legs. The skin on your abdomen may also feel numb. You may feel short of breath because of your expanding uterus. Other changes You may urinate more often because the fetus is moving lower into your pelvis  and pressing on your bladder. You may have more problems sleeping. This may be caused by the size of your abdomen, an increased need to urinate, and an increase in your body's metabolism. You may notice the fetus "dropping," or moving lower in your abdomen (lightening). You may have increased vaginal discharge. You may notice that you have pain around your pelvic bone as your uterus distends. Follow these instructions at home: Medicines Follow your health care provider's instructions regarding medicine use. Specific medicines may be either safe or unsafe to take during pregnancy. Do not take any medicines unless approved by your health care provider. Take a prenatal vitamin that contains at least 600 micrograms (mcg) of folic acid. Eating and drinking Eat a healthy diet that includes fresh fruits and vegetables, whole grains, good sources of protein such as meat, eggs, or tofu, and low-fat dairy products. Avoid raw meat and unpasteurized juice, milk, and cheese. These carry germs that can harm you and your baby. Eat 4 or 5 small meals rather than 3 large meals a day. You may need to take these actions to prevent or treat constipation: Drink enough fluid to keep your urine pale yellow. Eat foods that are high in fiber, such as beans, whole grains, and fresh fruits and vegetables. Limit foods that are high in fat and processed sugars, such as fried or sweet foods. Activity Exercise only as directed by your health care provider. Most people can continue their usual exercise routine during pregnancy. Try to  exercise for 30 minutes at least 5 days a week. Stop exercising if you experience contractions in the uterus. Stop exercising if you develop pain or cramping in the lower abdomen or lower back. Avoid heavy lifting. Do not exercise if it is very hot or humid or if you are at a high altitude. If you choose to, you may continue to have sex unless your health care provider tells you not  to. Relieving pain and discomfort Take frequent breaks and rest with your legs raised (elevated) if you have leg cramps or low back pain. Take warm sitz baths to soothe any pain or discomfort caused by hemorrhoids. Use hemorrhoid cream if your health care provider approves. Wear a supportive bra to prevent discomfort from breast tenderness. If you develop varicose veins: Wear support hose as told by your health care provider. Elevate your feet for 15 minutes, 3-4 times a day. Limit salt in your diet. Safety Talk to your health care provider before traveling far distances. Do not use hot tubs, steam rooms, or saunas. Wear your seat belt at all times when driving or riding in a car. Talk with your health care provider if someone is verbally or physically abusive to you. Preparing for birth To prepare for the arrival of your baby: Take prenatal classes to understand, practice, and ask questions about labor and delivery. Visit the hospital and tour the maternity area. Purchase a rear-facing car seat and make sure you know how to install it in your car. Prepare the baby's room or sleeping area. Make sure to remove all pillows and stuffed animals from the baby's crib to prevent suffocation. General instructions Avoid cat litter boxes and soil used by cats. These carry germs that can cause birth defects in the baby. If you have a cat, ask someone to clean the litter box for you. Do not douche or use tampons. Do not use scented sanitary pads. Do not use any products that contain nicotine or tobacco, such as cigarettes, e-cigarettes, and chewing tobacco. If you need help quitting, ask your health care provider. Do not use any herbal remedies, illegal drugs, or medicines that were not prescribed to you. Chemicals in these products can harm your baby. Do not drink alcohol. You will have more frequent prenatal exams during the third trimester. During a routine prenatal visit, your health care provider  will do a physical exam, perform tests, and discuss your overall health. Keep all follow-up visits. This is important. Where to find more information American Pregnancy Association: americanpregnancy.[place] and Gynecologists: PoolDevices.com.pt Office on [employer]: KeywordPortfolios.com.br Contact a health care provider if you have: A fever. Mild pelvic cramps, pelvic pressure, or nagging pain in your abdominal area or lower back. Vomiting or diarrhea. Bad-smelling vaginal discharge or foul-smelling urine. Pain when you urinate. A headache that does not go away when you take medicine. Visual changes or see spots in front of your eyes. Get help right away if: Your water breaks. You have regular contractions less than 5 minutes apart. You have spotting or bleeding from your vagina. You have severe abdominal pain. You have difficulty breathing. You have chest pain. You have fainting spells. You have not felt your baby move for the time period told by your health care provider. You have new or increased pain, swelling, or redness in an arm or leg. Summary The third trimester of pregnancy is from week 28 through week 40 (months 7 through 9). You may have more problems sleeping.  This can be caused by the size of your abdomen, an increased need to urinate, and an increase in your body's metabolism. You will have more frequent prenatal exams during the third trimester. Keep all follow-up visits. This is important. This information is not intended to replace advice given to you by your health care provider. Make sure you discuss any questions you have with your health care provider. Document Revised: 12/12/2019 Document Reviewed: 10/18/2019 Elsevier Patient Education  2022 [employer].  Preparing for Vaginal Birth After Cesarean Delivery Vaginal birth after cesarean delivery (VBAC) means giving birth vaginally after previously  delivering a baby through a cesarean section (C-section). You and your health care provider will discuss your options and whether you may be a good candidate for VBAC. Types of options After a cesarean birth, your options for future deliveries may include: A scheduled cesarean birth. This is done in a hospital with an operating room. A trial of labor after cesarean. A successful trial of labor results in a vaginal delivery. If it is not successful, you will need to have a cesarean birth. A trial of labor after a cesarean should be attempted in facilities where an emergency cesarean birth can be performed. What are the benefits? The benefits of delivering your baby vaginally instead of by a cesarean birth include: A shorter hospital stay. A faster recovery time. Less pain. Avoiding risks associated with major surgery, such as infection and blood clots. Less blood loss and less need for donated blood (transfusions). What are the risks? The main risk of attempting a VBAC is that it may fail. This would force your health care provider to deliver your baby by a C-section. Other risks are rare and may include: Tearing (rupture) of the scar from a past cesarean birth. Other risks associated with vaginal deliveries. If a repeat cesarean birth is needed, the risks include: Blood loss. Infection. Blood clot. Damage to surrounding organs. Removal of the uterus (hysterectomy), if it is damaged. Placenta problems in future pregnancies. What should I know about my past cesarean delivery? It is important to know what type of incision was made in your uterus in a past cesarean birth, such as vertical or transverse. The type of incision can affect the success of your trial of labor. Who are the best candidates for VBAC? The best candidates for VBAC are women who: Have had one or two prior cesarean births, and their incision makes it safe to attempt a vaginal delivery. Do not have a scar on their uterus.  A scar would make it unsafe to attempt a vaginal delivery. Have not had a tear in the wall of their uterus (uterine rupture). Plan to have more pregnancies. A VBAC is also more likely to be successful: In women who have previously given birth vaginally. When labor starts by itself (spontaneously) before the due date. When is VBAC not an option? As your pregnancy progresses, circumstances may change and you may need to reconsider your options. Your situation may also change as you begin a trial of labor. Your health care provider may not want you to attempt a VBAC if: You have had more than two cesarean births. You are past your due date. Your baby's suspected weight is 8.8 lb (4 kg) or more. The scar on your uterus from a previous cesarean birth makes it unsafe to deliver vaginally. You have a history of a uterine rupture. You have preeclampsia. This is a condition that causes high blood pressure along with other symptoms,  such as swelling and headaches. What else should I know about my options? Delivering a baby through a VBAC is similar to having a normal spontaneous vaginal delivery. Therefore, it is safe: To try with twins. For your health care provider to try to turn the baby from a breech position (external cephalic version) during labor. With epidural analgesia for pain relief. Consider where you would like to deliver your baby. VBAC should be attempted in facilities where an emergency cesarean birth can be performed. VBAC is not recommended for home births. Any changes in your health or your baby's health during your pregnancy may make it necessary to change your initial decision about VBAC. Questions to ask your health care provider Am I a good candidate for the trial of labor? What are my chances of a successful vaginal delivery? Is my preferred birth location equipped for a trial of labor? What are my pain management options during a trial of labor? Where to find more  information [employer] of Obstetricians and Gynecologists: www.acog.[place]: www.midwife.org Summary Vaginal birth after cesarean delivery (VBAC) is giving birth vaginally after previously having a baby through a cesarean section (C-section). A trial of labor should be attempted in facilities where emergency cesarean section procedures can be performed. Your health care provider may recommend a repeat cesarean birth if a vaginal delivery is not safe for you. This information is not intended to replace advice given to you by your health care provider. Make sure you discuss any questions you have with your health care provider. Document Revised: 07/03/2020 Document Reviewed: 07/03/2020 Elsevier Patient Education  2022 [employer].

## 2021-07-29 NOTE — MAU Note (Signed)
.  Morgan Newton is a 37 y.o. at 1w4dhere in MAU reporting: elevated BP in routine PNV today and recommended by provider to be evaluated in MAU.  Patient Reports occasional elevated BP's at home and in office.  Patient has noticed slight swelling in lower legs and upper right quadrant pain that occurs 2X day. Patient has GDM controlled on Metformin.  Pregnancy History includes IUFD with preeclampsia.  Today Patient began having lower ABD tightening.   Vitals:   07/29/21 1157 07/29/21 1220  BP: (!) 158/92 (!) 154/84  Pulse: 73 85  Resp: 20 20  Temp: 98.2 F (36.8 C)   SpO2: 100%      FHT:140  Lab orders placed from triage:  UA

## 2021-07-29 NOTE — Progress Notes (Signed)
° °  Subjective:  Morgan Newton is a 36 y.o. [phone] at 31w4dbeing seen today for ongoing prenatal care.  She is currently monitored for the following issues for this high-risk pregnancy and has Visit for routine gyn exam; DUB (dysfunctional uterine bleeding); Positive pregnancy test; Supervision of high risk pregnancy, antepartum; History of IUFD; History of cesarean delivery; Chronic hypertension; GDM (gestational diabetes mellitus); and Elevated LFTs on their problem list.  Patient reports  RUQ abdominal pain, denies HA, vision changes, SOB .  Contractions: Irregular. Vag. Bleeding: None.  Movement: (!) Decreased. Denies leaking of fluid.   The following portions of the patient's history were reviewed and updated as appropriate: allergies, current medications, past family history, past medical history, past social history, past surgical history and problem list. Problem list updated.  Objective:   Vitals:   07/29/21 1007  BP: (!) 145/93  Pulse: 95  Weight: 200 lb 11.2 oz (91 kg)    Fetal Status: Fetal Heart Rate (bpm): NST   Movement: (!) Decreased     General:  Alert, oriented and cooperative. Patient is in no acute distress.  Skin: Skin is warm and dry. No rash noted.   Cardiovascular: Normal heart rate noted  Respiratory: Normal respiratory effort, no problems with respiration noted  Abdomen: Soft, gravid, appropriate for gestational age. Pain/Pressure: Present     Pelvic: Vag. Bleeding: None Vag D/C Character: Mucous   Cervical exam deferred        Extremities: Normal range of motion.     Mental Status: Normal mood and affect. Normal behavior. Normal judgment and thought content.   Urinalysis:        Assessment and Plan:  Pregnancy: [id number]. Supervision of high risk pregnancy, antepartum BP mild range, see below FHR normal and NST reactive  2. History of IUFD Engaged in antenatal testing BPP 10/10 today  3. Gestational diabetes mellitus (GDM) in third  trimester controlled on oral hypoglycemic drug Did not bring log Reports all fastings less than 90 and all post prandials less than 120 Taking metformin [id number] Stressed importance of bringing log to next visit Last growth [place]/19/22, EFW 18%, AFI 13.9 cm, follow up scheduled for 08/04/21  4. History of cesarean delivery Desires TOLAC, papers signed 07/22/21  5. Anxiety disorder affecting pregnancy, antepartum Significant anxiety during visit today [facility]. Chronic hypertension Checking BP's constantly the last few days, see picture above Also endorsing some RUQ pain, though may be chronic Sent to MAU for eval Also has ketones and proteinuria on UA  7. Elevated LFTs Resolved, previously likely due to APAP  Preterm labor symptoms and general obstetric precautions including but not limited to vaginal bleeding, contractions, leaking of fluid and fetal movement were reviewed in detail with the patient. Please refer to After Visit Summary for other counseling recommendations.  [facility] about 2 weeks (around 08/12/2021) [facility]  *[facility], ob visit, needs MD.   EckClarnce FlockD

## 2021-07-29 NOTE — Progress Notes (Signed)
Pt reports decreased FM x2 days - felt good FM today during BPP and NST.  She also reports having elevated BP's yesterday - she denies having H/A or visual disturbances @ that time. she was asked to bring BP machine to next visit.  Pt reports increased episodes of indigestion/heartburn.  TUMS are mildly effective.  Pt has elevated PHQ-9 and GAD 7 scores.  [facility] offered and she accepted - appt will be scheduled.

## 2021-07-29 NOTE — MAU Note (Signed)
Sent from office, mild BP elevation today.  Denies HA, has been having intermittent RUQ, though none at this time, denies visual changes, reports slight increase in swelling in ankles.  Started having ctx's on the way over here.

## 2021-07-29 NOTE — MAU Provider Note (Addendum)
History     CSN: [id number]  Arrival date and time: 07/29/21 1144   None     Chief Complaint  Patient presents with   Contractions   Hypertension   HPI  Morgan Newton is a 36 y.o. female [phone] @ 109w4dwith a history of CHTN,  here with complaints of elevated BP, tightening in her abdomen, history of IUFD and nervous. Se was seen in the office today and had mildly elevated BP's without symptoms. She has no HA, no vision changes. She does feel some tightening in her upper abdomen. She is not taking anything for blood pressure management.   OB History     Gravida  9   Para  3   Term  2   Preterm  1   AB  5   Living  2      SAB  2   IAB  3   Ectopic      Multiple      Live Births  2           Past Medical History:  Diagnosis Date   Abnormal Pap smear 2012   colpo   Anemia    Asthma    Chlamydia    Depression    GDM (gestational diabetes mellitus) 04/03/2021   Genital herpes    Gonorrhea    Heart murmur    History of IUFD 03/03/2021   Hypertension    PIH   Infection    urinary tract infection   PID (acute pelvic inflammatory disease)    Pregnancy induced hypertension    Trichimoniasis     Past Surgical History:  Procedure Laterality Date   CESAREAN SECTION  07/22/2012   Procedure: CESAREAN SECTION;  Surgeon: BFrederico Hamman MD;  [facility];  Service: Obstetrics;  Laterality: N/A;  Primary Cesarean Section    GANGLION CYST EXCISION     MOUTH SURGERY      Family History  Problem Relation Age of Onset   Hypertension Maternal Grandfather    Hypertension Mother    Thyroid disease Mother    Hypertension Maternal Grandmother    Thyroid disease Maternal Grandmother    Asthma Father    Asthma Maternal Aunt    Asthma Paternal Grandmother    Other Neg Hx    Hearing loss Neg Hx     Social History   Tobacco Use   Smoking status: Some Days    Packs/day: 0.25    Types: Cigarettes   Smokeless tobacco: Never  Vaping Use    Vaping Use: Never used  Substance Use Topics   Alcohol use: Not Currently    Alcohol/week: 3.0 standard drinks    Types: 3 Standard drinks or equivalent per week    Comment: not during preg   Drug use: No    Allergies:  Allergies  Allergen Reactions   Penicillins Shortness Of Breath and Palpitations    ..Marland Kitchenas patient had a PCN reaction causing immediate rash, facial/tongue/throat swelling, SOB or lightheadedness with hypotension: NO Has patient had a PCN reaction causing severe rash involving mucus membranes or skin necrosis: NO Has patient had a PCN reaction that required hospitalization No Has patient had a PCN reaction occurring within the last 10 years: No If all of the above answers are "NO", then may proceed with Cephalosporin use.    Caffeine Other (See Comments)    Heart murmur    Medications Prior to Admission  Medication Sig Dispense Refill Last Dose  aspirin EC 81 MG tablet Take 1 tablet (81 mg total) by mouth daily. Swallow whole. 30 tablet 11    Blood Pressure Monitoring DEVI 1 each by Does not apply route once a week. 1 each 0    metFORMIN (GLUCOPHAGE) 500 MG tablet Take 1 tablet (500 mg total) by mouth 2 (two) times daily with a meal. 60 tablet 5    Prenatal Vit-Fe Fumarate-FA (PREPLUS) 27-1 MG TABS Take 1 tablet by mouth daily. 30 tablet 13    Results for orders placed or performed during the hospital encounter of 07/29/21 (from the past 48 hour(s))  Protein / creatinine ratio, urine     Status: Abnormal   Collection Time: 07/29/21 12:25 PM  Result Value Ref Range   Creatinine, Urine 69.63 mg/dL   Total Protein, Urine 43 mg/dL    Comment: NO NORMAL RANGE ESTABLISHED FOR THIS TEST   Protein Creatinine Ratio 0.62 (H) 0.00 - 0.15 mg/mg[Cre]    Comment: Performed at [facility], [place] [address], [place], [place] [address]  CBC     Status: Abnormal   Collection Time: 07/29/21 12:33 PM  Result Value Ref Range   WBC 9.1 4.0 - 10.5 K/uL   RBC 3.81 (L) 3.87 -  5.11 MIL/uL   Hemoglobin 12.0 12.0 - 15.0 g/dL   HCT 34.6 (L) 36.0 - 46.0 %   MCV 90.8 80.0 - 100.0 fL   MCH 31.5 26.0 - 34.0 pg   MCHC 34.7 30.0 - 36.0 g/dL   RDW 12.8 11.5 - 15.5 %   Platelets 323 150 - 400 K/uL   nRBC 0.0 0.0 - 0.2 %    Comment: Performed at [facility], [place] [address] [place], [place] [address]  Comprehensive metabolic panel     Status: Abnormal   Collection Time: 07/29/21 12:33 PM  Result Value Ref Range   Sodium 134 (L) 135 - 145 mmol/L   Potassium 3.5 3.5 - 5.1 mmol/L   Chloride 103 98 - 111 mmol/L   CO2 21 (L) 22 - 32 mmol/L   Glucose, Bld 70 70 - 99 mg/dL    Comment: Glucose reference range applies only to samples taken after fasting for at least 8 hours.   BUN <5 (L) 6 - 20 mg/dL   Creatinine, Ser 0.56 0.44 - 1.00 mg/dL   Calcium 8.9 8.9 - 10.3 mg/dL   Total Protein 6.0 (L) 6.5 - 8.1 g/dL   Albumin 2.7 (L) 3.5 - 5.0 g/dL   AST 24 15 - 41 U/L   ALT 20 0 - 44 U/L   Alkaline Phosphatase 124 38 - 126 U/L   Total Bilirubin 0.7 0.3 - 1.2 mg/dL   GFR, Estimated >60 >60 mL/min    Comment: (NOTE) Calculated using the CKD-EPI Creatinine Equation (2021)    Anion gap 10 5 - 15    Comment: Performed at [facility], [place] [address] [place], [place] [address]    Review of Systems  Constitutional:  Negative for fever.  Eyes:  Negative for photophobia and visual disturbance.  Neurological:  Negative for headaches.  Physical Exam   Blood pressure 138/77, pulse (!) 104, temperature 98.2 F (36.8 C), temperature source Oral, resp. rate 20, height 4' 11"  (1.499 m), weight 91.2 kg, last menstrual period 08/11/2020, SpO2 100 %.  Patient Vitals for the past 24 hrs:  BP Temp Temp src Pulse Resp SpO2 Height Weight  07/29/21 1416 (!) 152/82 -- -- 96 -- -- -- --  07/29/21 1401 138/77 -- -- (!) 104 -- 100 % -- --  07/29/21 1346 (!) 150/80 -- -- 80 -- 100 % -- --  07/29/21 1330 138/82 -- -- (!) 109 -- 100 % -- --  07/29/21 1315 137/87 -- -- (!) 101 -- 100  % -- --  07/29/21 1301 (!) 143/87 -- -- 76 -- 100 % -- --  07/29/21 1248 137/82 -- -- 82 -- 100 % -- --  07/29/21 1232 140/89 -- -- 81 20 100 % -- --  07/29/21 1220 (!) 154/84 -- -- 85 20 99 % -- --  07/29/21 1157 (!) 158/92 98.2 F (36.8 C) Oral 73 20 100 % 4' 11"  (1.499 m) 91.2 kg    Physical Exam Constitutional:      General: She is not in acute distress.    Appearance: Normal appearance. She is not ill-appearing, toxic-appearing or diaphoretic.  Genitourinary:    Comments: Cervix closed, thick, posterior.  Exam by Noni Saupe, NP Musculoskeletal:        General: No swelling.  Skin:    General: Skin is warm.  Neurological:     Mental Status: She is alert and oriented to person, place, and time.     Deep Tendon Reflexes: Reflexes normal.     Comments: No clonus    Fetal Tracing: Baseline: 130 bpm Variability: Moderate  Accelerations: 15x15 Decelerations: None Toco:  Frequent, with UI   MAU Course  Procedures None  MDM  PIH labs Reviewed labs in detail with patient. She was informed of her Pre E status. She is agreeable for a BP check on Friday.  Reviewed patient with Dr.   Judd Gaudier and Plan   A:  1. Chronic hypertension   2. [redacted] weeks gestation of pregnancy   3. Braxton Hick's contraction      P:  Discharge home in stable condition Rx: Procardia  BP check on Friday- the [facility] scheduled the visit Return to MAU if symptoms worsen Pre E precautions reviewed.   Lezlie Lye, NP 07/29/2021 4:07 PM

## 2021-07-30 ENCOUNTER — Encounter: Payer: Self-pay | Admitting: Family Medicine

## 2021-07-31 ENCOUNTER — Encounter: Payer: Self-pay | Admitting: *Deleted

## 2021-07-31 ENCOUNTER — Ambulatory Visit: Payer: Medicaid Other

## 2021-07-31 DIAGNOSIS — F17200 Nicotine dependence, unspecified, uncomplicated: Secondary | ICD-10-CM | POA: Diagnosis not present

## 2021-07-31 DIAGNOSIS — F411 Generalized anxiety disorder: Secondary | ICD-10-CM | POA: Diagnosis not present

## 2021-08-03 ENCOUNTER — Inpatient Hospital Stay (HOSPITAL_COMMUNITY)
Admission: AD | Admit: 2021-08-03 | Discharge: 2021-08-03 | Disposition: A | Discharge status: Home / Self Care | Payer: Medicaid Other | Attending: Family Medicine | Admitting: Family Medicine

## 2021-08-03 ENCOUNTER — Other Ambulatory Visit: Payer: Self-pay

## 2021-08-03 ENCOUNTER — Encounter (HOSPITAL_COMMUNITY): Payer: Self-pay | Admitting: Family Medicine

## 2021-08-03 DIAGNOSIS — Z3A35 35 weeks gestation of pregnancy: Secondary | ICD-10-CM | POA: Insufficient documentation

## 2021-08-03 DIAGNOSIS — R519 Headache, unspecified: Secondary | ICD-10-CM | POA: Diagnosis not present

## 2021-08-03 DIAGNOSIS — O119 Pre-existing hypertension with pre-eclampsia, unspecified trimester: Secondary | ICD-10-CM

## 2021-08-03 DIAGNOSIS — R42 Dizziness and giddiness: Secondary | ICD-10-CM | POA: Diagnosis not present

## 2021-08-03 DIAGNOSIS — O1413 Severe pre-eclampsia, third trimester: Secondary | ICD-10-CM | POA: Insufficient documentation

## 2021-08-03 DIAGNOSIS — O26893 Other specified pregnancy related conditions, third trimester: Secondary | ICD-10-CM | POA: Diagnosis not present

## 2021-08-03 DIAGNOSIS — G4489 Other headache syndrome: Secondary | ICD-10-CM | POA: Diagnosis not present

## 2021-08-03 DIAGNOSIS — R110 Nausea: Secondary | ICD-10-CM | POA: Diagnosis not present

## 2021-08-03 DIAGNOSIS — O159 Eclampsia, unspecified as to time period: Secondary | ICD-10-CM | POA: Diagnosis not present

## 2021-08-03 DIAGNOSIS — R000 Tachycardia, unspecified: Secondary | ICD-10-CM | POA: Diagnosis not present

## 2021-08-03 LAB — CBC
HCT: 35.7 % — ABNORMAL LOW (ref 36.0–46.0)
Hemoglobin: 12.1 g/dL (ref 12.0–15.0)
MCH: 30.9 pg (ref 26.0–34.0)
MCHC: 33.9 g/dL (ref 30.0–36.0)
MCV: 91.1 fL (ref 80.0–100.0)
Platelets: 347 10*3/uL (ref 150–400)
RBC: 3.92 MIL/uL (ref 3.87–5.11)
RDW: 13 % (ref 11.5–15.5)
WBC: 8.7 10*3/uL (ref 4.0–10.5)
nRBC: 0 % (ref 0.0–0.2)

## 2021-08-03 LAB — PROTEIN / CREATININE RATIO, URINE
Creatinine, Urine: 183.87 mg/dL
Protein Creatinine Ratio: 0.38 mg/mg{Cre} — ABNORMAL HIGH (ref 0.00–0.15)
Total Protein, Urine: 69 mg/dL

## 2021-08-03 LAB — COMPREHENSIVE METABOLIC PANEL
ALT: 34 U/L (ref 0–44)
AST: 40 U/L (ref 15–41)
Albumin: 2.5 g/dL — ABNORMAL LOW (ref 3.5–5.0)
Alkaline Phosphatase: 141 U/L — ABNORMAL HIGH (ref 38–126)
Anion gap: 9 (ref 5–15)
BUN: 5 mg/dL — ABNORMAL LOW (ref 6–20)
CO2: 21 mmol/L — ABNORMAL LOW (ref 22–32)
Calcium: 9.6 mg/dL (ref 8.9–10.3)
Chloride: 106 mmol/L (ref 98–111)
Creatinine, Ser: 0.64 mg/dL (ref 0.44–1.00)
GFR, Estimated: >60 mL/min (ref >60)
Glucose, Bld: 104 mg/dL — ABNORMAL HIGH (ref 70–99)
Potassium: 3.1 mmol/L — ABNORMAL LOW (ref 3.5–5.1)
Sodium: 136 mmol/L (ref 135–145)
Total Bilirubin: 0.5 mg/dL (ref 0.3–1.2)
Total Protein: 5.6 g/dL — ABNORMAL LOW (ref 6.5–8.1)

## 2021-08-03 LAB — URINALYSIS, ROUTINE W REFLEX MICROSCOPIC
Glucose, UA: NEGATIVE mg/dL
Ketones, ur: >80 mg/dL — AB
Leukocytes,Ua: NEGATIVE
Nitrite: NEGATIVE
Protein, ur: 100 mg/dL — AB
Specific Gravity, Urine: 1.015 (ref 1.005–1.030)
pH: 8 (ref 5.0–8.0)

## 2021-08-03 LAB — URINALYSIS, MICROSCOPIC (REFLEX): Bacteria, UA: NONE SEEN

## 2021-08-03 MED ORDER — LACTATED RINGERS IV SOLN: INTRAVENOUS | Status: DC

## 2021-08-03 MED ORDER — DIPHENHYDRAMINE HCL 50 MG/ML IJ SOLN
25.0000 mg | Freq: Once | INTRAMUSCULAR | Status: AC
  Administered 2021-08-03: 25 mg via INTRAVENOUS
  Filled 2021-08-03: qty 1

## 2021-08-03 MED ORDER — METOCLOPRAMIDE HCL 5 MG/ML IJ SOLN
10.0000 mg | Freq: Once | INTRAMUSCULAR | Status: AC
  Administered 2021-08-03: 10 mg via INTRAVENOUS
  Filled 2021-08-03: qty 2

## 2021-08-03 NOTE — MAU Note (Signed)
Pt arrived via EMS. About 30 minutes ago she reports she got a headache and felt dizzy and then had nausea and vomiting.   Denies LOF or vaginal bleeding.   Reports +FM

## 2021-08-03 NOTE — MAU Provider Note (Signed)
History     CSN: [id number]  Arrival date and time: 08/03/21 1455   Event Date/Time   First Provider Initiated Contact with Patient 08/03/21 1508      Chief Complaint  Patient presents with   Headache   HPI Morgan Newton is a 36 y.o. [id number] at 51w2dwho presents via EMS for headache & n/v.  She has chronic hypertension & was diagnosed with si preeclampsia last week. Take procardia QAM & hasn't missed any doses.  Symptoms started about an hour prior to arrival. Reports frontal headache that she currently rated 6/10 (was 8/10 but has improved w/o intervention). Pain worse with lights. Had n/v after headache started which has resolved after being given zofran by EMS. Denies visual disturbance or epigastric pain.  Reports good fetal movement.   OB History     Gravida  9   Para  3   Term  2   Preterm  1   AB  5   Living  2      SAB  2   IAB  3   Ectopic      Multiple      Live Births  2           Past Medical History:  Diagnosis Date   Abnormal Pap smear 2012   colpo   Anemia    Asthma    Chlamydia    Depression    GDM (gestational diabetes mellitus) 04/03/2021   Genital herpes    Gonorrhea    Heart murmur    History of IUFD 03/03/2021   Hypertension    PIH   PID (acute pelvic inflammatory disease)    Pregnancy induced hypertension    Trichimoniasis     Past Surgical History:  Procedure Laterality Date   CESAREAN SECTION  07/22/2012   Procedure: CESAREAN SECTION;  Surgeon: BFrederico Hamman MD;  [facility];  Service: Obstetrics;  Laterality: N/A;  Primary Cesarean Section    GANGLION CYST EXCISION     MOUTH SURGERY      Family History  Problem Relation Age of Onset   Hypertension Maternal Grandfather    Hypertension Mother    Thyroid disease Mother    Hypertension Maternal Grandmother    Thyroid disease Maternal Grandmother    Asthma Father    Asthma Maternal Aunt    Asthma Paternal Grandmother    Other Neg Hx    Hearing  loss Neg Hx     Social History   Tobacco Use   Smoking status: Some Days    Packs/day: 0.25    Types: Cigarettes   Smokeless tobacco: Never  Vaping Use   Vaping Use: Never used  Substance Use Topics   Alcohol use: Not Currently    Alcohol/week: 3.0 standard drinks    Types: 3 Standard drinks or equivalent per week    Comment: not during preg   Drug use: No    Allergies:  Allergies  Allergen Reactions   Penicillins Shortness Of Breath and Palpitations    ..Marland Kitchenas patient had a PCN reaction causing immediate rash, facial/tongue/throat swelling, SOB or lightheadedness with hypotension: NO Has patient had a PCN reaction causing severe rash involving mucus membranes or skin necrosis: NO Has patient had a PCN reaction that required hospitalization No Has patient had a PCN reaction occurring within the last 10 years: No If all of the above answers are "NO", then may proceed with Cephalosporin use.    Caffeine Other (See Comments)  Heart murmur    Medications Prior to Admission  Medication Sig Dispense Refill Last Dose   aspirin EC 81 MG tablet Take 1 tablet (81 mg total) by mouth daily. Swallow whole. 30 tablet 11 08/03/2021   metFORMIN (GLUCOPHAGE) 500 MG tablet Take 1 tablet (500 mg total) by mouth 2 (two) times daily with a meal. 60 tablet 5 08/03/2021   NIFEdipine (PROCARDIA-XL/NIFEDICAL-XL) 30 MG 24 hr tablet Take 1 tablet (30 mg total) by mouth daily. 30 tablet 11 08/03/2021   Prenatal Vit-Fe Fumarate-FA (PREPLUS) 27-1 MG TABS Take 1 tablet by mouth daily. 30 tablet 13 08/02/2021   Blood Pressure Monitoring DEVI 1 each by Does not apply route once a week. 1 each 0     Review of Systems  Constitutional: Negative.   Eyes:  Positive for photophobia. Negative for visual disturbance.  Gastrointestinal:  Positive for nausea and vomiting. Negative for abdominal pain.  Genitourinary: Negative.   Neurological:  Positive for headaches.  Physical Exam   Blood pressure 131/83, pulse  85, temperature 98.6 F (37 C), resp. rate 16, last menstrual period 08/11/2020, SpO2 100 %.  Patient Vitals for the past 24 hrs:  BP Temp Pulse Resp SpO2  08/03/21 1645 131/83 -- 85 -- 100 %  08/03/21 1630 130/83 -- 89 -- 99 %  08/03/21 1615 136/84 -- 91 -- 100 %  08/03/21 1600 132/71 -- (!) 102 -- --  08/03/21 1546 (!) 139/95 -- (!) 112 -- --  08/03/21 1515 (!) 141/90 -- 97 -- 98 %  08/03/21 1457 139/89 98.6 F (37 C) (!) 117 16 100 %    Physical Exam Vitals and nursing note reviewed.  Constitutional:      General: She is not in acute distress.    Appearance: She is well-developed. She is not ill-appearing.  HENT:     Head: Normocephalic and atraumatic.  Eyes:     General: No scleral icterus.    Extraocular Movements: Extraocular movements intact.  Cardiovascular:     Rate and Rhythm: Normal rate and regular rhythm.     Heart sounds: Normal heart sounds.  Pulmonary:     Effort: Pulmonary effort is normal. No respiratory distress.     Breath sounds: Normal breath sounds. No wheezing.  Musculoskeletal:     Right lower leg: No edema.     Left lower leg: No edema.  Skin:    General: Skin is warm and dry.  Neurological:     Mental Status: She is alert.     Deep Tendon Reflexes: Reflexes normal.     Reflex Scores:      Patellar reflexes are 2+ on the right side and 2+ on the left side.    Comments: No clonus  Psychiatric:        Mood and Affect: Mood normal.        Behavior: Behavior normal.   NST:  Baseline: 145 bpm, Variability: Good {> 6 bpm), Accelerations: Reactive, and Decelerations: Absent  MAU Course  Procedures Results for orders placed or performed during the hospital encounter of 08/03/21 (from the past 24 hour(s))  CBC     Status: Abnormal   Collection Time: 08/03/21  3:18 PM  Result Value Ref Range   WBC 8.7 4.0 - 10.5 K/uL   RBC 3.92 3.87 - 5.11 MIL/uL   Hemoglobin 12.1 12.0 - 15.0 g/dL   HCT 35.7 (L) 36.0 - 46.0 %   MCV 91.1 80.0 - 100.0 fL   MCH  30.9 26.0 -  34.0 pg   MCHC 33.9 30.0 - 36.0 g/dL   RDW 13.0 11.5 - 15.5 %   Platelets 347 150 - 400 K/uL   nRBC 0.0 0.0 - 0.2 %  Comprehensive metabolic panel     Status: Abnormal   Collection Time: 08/03/21  3:18 PM  Result Value Ref Range   Sodium 136 135 - 145 mmol/L   Potassium 3.1 (L) 3.5 - 5.1 mmol/L   Chloride 106 98 - 111 mmol/L   CO2 21 (L) 22 - 32 mmol/L   Glucose, Bld 104 (H) 70 - 99 mg/dL   BUN 5 (L) 6 - 20 mg/dL   Creatinine, Ser 0.64 0.44 - 1.00 mg/dL   Calcium 9.6 8.9 - 10.3 mg/dL   Total Protein 5.6 (L) 6.5 - 8.1 g/dL   Albumin 2.5 (L) 3.5 - 5.0 g/dL   AST 40 15 - 41 U/L   ALT 34 0 - 44 U/L   Alkaline Phosphatase 141 (H) 38 - 126 U/L   Total Bilirubin 0.5 0.3 - 1.2 mg/dL   GFR, Estimated >60 >60 mL/min   Anion gap 9 5 - 15  Urinalysis, Routine w reflex microscopic Urine, Clean Catch     Status: Abnormal   Collection Time: 08/03/21  3:30 PM  Result Value Ref Range   Color, Urine YELLOW YELLOW   APPearance CLEAR CLEAR   Specific Gravity, Urine 1.015 1.005 - 1.030   pH 8.0 5.0 - 8.0   Glucose, UA NEGATIVE NEGATIVE mg/dL   Hgb urine dipstick TRACE (A) NEGATIVE   Bilirubin Urine SMALL (A) NEGATIVE   Ketones, ur >80 (A) NEGATIVE mg/dL   Protein, ur 100 (A) NEGATIVE mg/dL   Nitrite NEGATIVE NEGATIVE   Leukocytes,Ua NEGATIVE NEGATIVE  Protein / creatinine ratio, urine     Status: Abnormal   Collection Time: 08/03/21  3:30 PM  Result Value Ref Range   Creatinine, Urine 183.87 mg/dL   Total Protein, Urine 69 mg/dL   Protein Creatinine Ratio 0.38 (H) 0.00 - 0.15 mg/mg[Cre]  Urinalysis, Microscopic (reflex)     Status: None   Collection Time: 08/03/21  3:30 PM  Result Value Ref Range   RBC / HPF 0-5 0 - 5 RBC/hpf   WBC, UA 0-5 0 - 5 WBC/hpf   Bacteria, UA NONE SEEN NONE SEEN   Squamous Epithelial / LPF 0-5 0 - 5   Mucus PRESENT     MDM Patient presents for headache & n/v in setting of CHTN si preeclampsia.  BPs & preeclampsia labs stable. No severe range  BPs in MAU. Headache treated with reglan & benadryl (patient declined tylenol) with improvement in symptoms. Pt has close f/u with MFM & ob. Discussed w/Dr. Nehemiah Settle - ok to discharge home.   Discussed with patient possibility of h/a as SE of procardia but will not change medication based on 1 episode. Pt now thinks her headache was related to anxiety that she was experiencing at the time.   Assessment and Plan   1. Chronic hypertension with superimposed preeclampsia  -reviewed s/s of worsening preeclampsia & reasons to return to MAU -keep f/u appointment -continue procardia as prescribed  2. Headache in pregnancy, antepartum, third trimester  -reviewed tx of future headaches -if headaches recurrent & not severe preeclampsia, may consider changing antihypertensive  3. [redacted] weeks gestation of pregnancy      Jorje Guild 08/03/2021, 5:09 PM

## 2021-08-04 ENCOUNTER — Ambulatory Visit: Payer: Medicaid Other | Admitting: *Deleted

## 2021-08-04 ENCOUNTER — Encounter: Payer: Self-pay | Admitting: *Deleted

## 2021-08-04 ENCOUNTER — Other Ambulatory Visit: Payer: Self-pay | Admitting: Maternal & Fetal Medicine

## 2021-08-04 ENCOUNTER — Ambulatory Visit: Payer: Medicaid Other | Attending: Maternal & Fetal Medicine

## 2021-08-04 VITALS — BP 145/83 | HR 95

## 2021-08-04 DIAGNOSIS — O10013 Pre-existing essential hypertension complicating pregnancy, third trimester: Secondary | ICD-10-CM

## 2021-08-04 DIAGNOSIS — O24419 Gestational diabetes mellitus in pregnancy, unspecified control: Secondary | ICD-10-CM

## 2021-08-04 DIAGNOSIS — Z3A35 35 weeks gestation of pregnancy: Secondary | ICD-10-CM | POA: Diagnosis not present

## 2021-08-04 DIAGNOSIS — O364XX0 Maternal care for intrauterine death, not applicable or unspecified: Secondary | ICD-10-CM

## 2021-08-04 DIAGNOSIS — O99213 Obesity complicating pregnancy, third trimester: Secondary | ICD-10-CM | POA: Insufficient documentation

## 2021-08-04 DIAGNOSIS — I10 Essential (primary) hypertension: Secondary | ICD-10-CM

## 2021-08-04 NOTE — Procedures (Signed)
BETTEY MURAOKA May 25, 1986 [redacted]w[redacted]d Fetus A Non-Stress Test Interpretation for 08/04/21  Indication: Unsatisfactory BPP  Fetal Heart Rate A Mode: External Baseline Rate (A): 140 bpm Variability: Moderate Accelerations: 15 x 15 Decelerations: None  Uterine Activity Mode: Toco Contraction Frequency (min): UI Contraction Quality: Mild Resting Tone Palpated: Relaxed  Interpretation (Fetal Testing) Nonstress Test Interpretation: Reactive Overall Impression: Reassuring for gestational age Comments: tracing reviewed by Dr. SDonalee Citrin

## 2021-08-06 ENCOUNTER — Encounter: Payer: Self-pay | Admitting: Family Medicine

## 2021-08-06 DIAGNOSIS — Z3201 Encounter for pregnancy test, result positive: Secondary | ICD-10-CM

## 2021-08-06 MED ORDER — PREPLUS 27-1 MG PO TABS: 1.0000 | ORAL_TABLET | Freq: Every day | ORAL | 13 refills | Status: DC

## 2021-08-07 DIAGNOSIS — F17200 Nicotine dependence, unspecified, uncomplicated: Secondary | ICD-10-CM | POA: Diagnosis not present

## 2021-08-07 DIAGNOSIS — F411 Generalized anxiety disorder: Secondary | ICD-10-CM | POA: Diagnosis not present

## 2021-08-07 NOTE — BH Specialist Note (Deleted)
Integrated Behavioral Health via Telemedicine Visit  08/07/2021 Morgan Newton [id number]  Number of [place] visits: 1 Session Start time: 9:45***  Session End time: 10:45*** Total time: {IBH Total Time:21014050}  Referring Provider: *** Patient/Family location: Home*** [facility] Provider location: [facility] [place] [facility]  All persons participating in visit: Patient *** and Morgan Newton ***  Types of Service: {CHL AMB TYPE OF [phone]}  I connected with Morgan Newton and/or Morgan Newton's {family members:20773} via  Telephone or Geologist, engineering  (Video is Tree surgeon) and verified that I am speaking with the correct person using two identifiers. Discussed confidentiality: {YES/NO:21197}  I discussed the limitations of telemedicine and the availability of in person appointments.  Discussed there is a possibility of technology failure and discussed alternative modes of communication if that failure occurs.  I discussed that engaging in this telemedicine visit, they consent to the provision of behavioral healthcare and the services will be billed under their insurance.  Patient and/or legal guardian expressed understanding and consented to Telemedicine visit: {YES/NO:21197}  Presenting Concerns: Patient and/or family reports the following symptoms/concerns: *** Duration of problem: ***; Severity of problem: {Mild/Moderate/Severe:20260}  Patient and/or Family's Strengths/Protective Factors: {CHL AMB BH PROTECTIVE FACTORS:[phone]}  Goals Addressed: Patient will:  Reduce symptoms of: {IBH Symptoms:21014056}   Increase knowledge and/or ability of: {IBH Patient Tools:21014057}   Demonstrate ability to: {IBH Goals:21014053}  Progress towards Goals: {CHL AMB BH PROGRESS TOWARDS GOALS:[phone]}  Interventions: Interventions utilized:  {IBH  Interventions:21014054} Standardized Assessments completed: {IBH Screening Tools:21014051}  Patient and/or Family Response: ***  Assessment: Patient currently experiencing ***.   Patient may benefit from ***.  Plan: Follow up with behavioral health clinician on : *** Behavioral recommendations: *** Referral(s): {IBH Referrals:21014055}  I discussed the assessment and treatment plan with the patient and/or parent/guardian. They were provided an opportunity to ask questions and all were answered. They agreed with the plan and demonstrated an understanding of the instructions.   They were advised to call back or seek an in-person evaluation if the symptoms worsen or if the condition fails to improve as anticipated.  Morgan Hamman Armondo Cech, LCSW

## 2021-08-12 ENCOUNTER — Ambulatory Visit: Payer: Medicaid Other | Admitting: General Practice

## 2021-08-12 ENCOUNTER — Ambulatory Visit (INDEPENDENT_AMBULATORY_CARE_PROVIDER_SITE_OTHER): Payer: Medicaid Other | Admitting: Family Medicine

## 2021-08-12 ENCOUNTER — Other Ambulatory Visit (HOSPITAL_COMMUNITY)
Admission: RE | Admit: 2021-08-12 | Discharge: 2021-08-12 | Disposition: A | Discharge status: Home / Self Care | Payer: Medicaid Other | Source: Ambulatory Visit | Attending: Family Medicine | Admitting: Family Medicine

## 2021-08-12 ENCOUNTER — Encounter: Payer: Self-pay | Admitting: Family Medicine

## 2021-08-12 ENCOUNTER — Other Ambulatory Visit: Payer: Self-pay

## 2021-08-12 ENCOUNTER — Ambulatory Visit (INDEPENDENT_AMBULATORY_CARE_PROVIDER_SITE_OTHER): Payer: Medicaid Other

## 2021-08-12 VITALS — BP 148/85 | HR 110 | Wt 199.0 lb

## 2021-08-12 DIAGNOSIS — Z8759 Personal history of other complications of pregnancy, childbirth and the puerperium: Secondary | ICD-10-CM | POA: Diagnosis not present

## 2021-08-12 DIAGNOSIS — I10 Essential (primary) hypertension: Secondary | ICD-10-CM

## 2021-08-12 DIAGNOSIS — O24410 Gestational diabetes mellitus in pregnancy, diet controlled: Secondary | ICD-10-CM

## 2021-08-12 DIAGNOSIS — O0990 Supervision of high risk pregnancy, unspecified, unspecified trimester: Secondary | ICD-10-CM

## 2021-08-12 DIAGNOSIS — O24415 Gestational diabetes mellitus in pregnancy, controlled by oral hypoglycemic drugs: Secondary | ICD-10-CM | POA: Diagnosis not present

## 2021-08-12 DIAGNOSIS — Z98891 History of uterine scar from previous surgery: Secondary | ICD-10-CM

## 2021-08-12 LAB — OB RESULTS CONSOLE GC/CHLAMYDIA: Gonorrhea: NEGATIVE

## 2021-08-12 MED ORDER — POLYETHYLENE GLYCOL 3350 17 GM/SCOOP PO POWD: 17.0000 g | Freq: Every day | ORAL | 1 refills | Status: DC | PRN

## 2021-08-12 MED ORDER — NIFEDIPINE ER OSMOTIC RELEASE 30 MG PO TB24: 30.0000 mg | ORAL_TABLET | Freq: Two times a day (BID) | ORAL | 11 refills | Status: DC

## 2021-08-12 MED ORDER — FAMOTIDINE 40 MG PO TABS: 40.0000 mg | ORAL_TABLET | Freq: Every day | ORAL | 2 refills | Status: DC

## 2021-08-12 NOTE — Progress Notes (Signed)
° °  Subjective:  Morgan Newton is a 36 y.o. [phone] at 42w4dbeing seen today for ongoing prenatal care.  She is currently monitored for the following issues for this high-risk pregnancy and has Visit for routine gyn exam; DUB (dysfunctional uterine bleeding); Positive pregnancy test; Supervision of high risk pregnancy, antepartum; History of IUFD; History of cesarean delivery; Chronic hypertension; GDM (gestational diabetes mellitus); and Elevated LFTs on their problem list.  Patient reports no complaints.  Contractions: Irritability. Vag. Bleeding: None.  Movement: Present. Denies leaking of fluid.   The following portions of the patient's history were reviewed and updated as appropriate: allergies, current medications, past family history, past medical history, past social history, past surgical history and problem list. Problem list updated.  Objective:   Vitals:   08/12/21 0925  BP: (!) 148/85  Pulse: (!) 110  Weight: 199 lb (90.3 kg)    Fetal Status: Fetal Heart Rate (bpm): NST   Movement: Present     General:  Alert, oriented and cooperative. Patient is in no acute distress.  Skin: Skin is warm and dry. No rash noted.   Cardiovascular: Normal heart rate noted  Respiratory: Normal respiratory effort, no problems with respiration noted  Abdomen: Soft, gravid, appropriate for gestational age. Pain/Pressure: Present     Pelvic: Vag. Bleeding: None     Cervical exam deferred        Extremities: Normal range of motion.  Edema: Trace  Mental Status: Normal mood and affect. Normal behavior. Normal judgment and thought content.   Urinalysis:      Assessment and Plan:  Pregnancy: [id number] 390w4d1. Supervision of high risk pregnancy, antepartum BP mildly elevated, FHR normal BPP 10/10 today Swabs today - Strep Gp B Culture+Rflx - [facility]. History of IUFD Weekly antenatal testing  BPP 10/10 today  3. Chronic hypertension Taking  nifedipine 30 XL daily Mild range today Increased to 30 mg BID Discussed that we may need to move up induction to 38 weeks pending next weeks BP check  4. Diet controlled gestational diabetes mellitus (GDM) in second trimester Brought log Not consistently checking but overall well controlled, no change to regimen On metformin 50012mn antenatal testing Last growth US [place]17/23, EFW 27%, AFI 8  5. History of cesarean delivery Desires TOLAC  Preterm labor symptoms and general obstetric precautions including but not limited to vaginal bleeding, contractions, leaking of fluid and fetal movement were reviewed in detail with the patient. Please refer to After Visit Summary for other counseling recommendations.  Return in 1 [facility] (on 08/19/2021) [facility], needs MD.   EckClarnce FlockD

## 2021-08-12 NOTE — Patient Instructions (Signed)

## 2021-08-12 NOTE — Progress Notes (Signed)
Pt informed that the ultrasound is considered a limited OB ultrasound and is not intended to be a complete ultrasound exam.  Patient also informed that the ultrasound is not being completed with the intent of assessing for fetal or placental anomalies or any pelvic abnormalities.  Explained that the purpose of todays ultrasound is to assess for  BPP, presentation, and AFI.  Patient acknowledges the purpose of the exam and the limitations of the study.     Koren Bound RN BSN 08/12/21

## 2021-08-14 DIAGNOSIS — F411 Generalized anxiety disorder: Secondary | ICD-10-CM | POA: Diagnosis not present

## 2021-08-14 DIAGNOSIS — F17200 Nicotine dependence, unspecified, uncomplicated: Secondary | ICD-10-CM | POA: Diagnosis not present

## 2021-08-14 LAB — GC/CHLAMYDIA PROBE AMP (~~LOC~~) NOT AT ARMC
Chlamydia: NEGATIVE
Comment: NEGATIVE
Comment: NORMAL
Neisseria Gonorrhea: NEGATIVE

## 2021-08-16 LAB — STREP GP B CULTURE+RFLX: Strep Gp B Culture+Rflx: NEGATIVE

## 2021-08-19 ENCOUNTER — Encounter: Payer: Self-pay | Admitting: Family Medicine

## 2021-08-19 NOTE — BH Specialist Note (Signed)
Integrated Behavioral Health via Telemedicine Visit  09/01/2021 [name] [id number]  Number of [place] Clinician visits: 1- Initial Visit  Session Start time: 0916   Session End time: 0950  Total time in minutes: 34   Referring Provider: Clayton Lefort, MD Patient/Family location: Home [facility] Provider location: Center for [facility] at [facility]  All persons participating in visit: Patient Morgan Newton and Morgan Newton   Types of Service: Individual psychotherapy and Video visit  I connected with Morgan Newton and/or Morgan Newton's  n/a  via  Telephone or Video Enabled Telemedicine Application  (Video is Caregility application) and verified that I am speaking with the correct person using two identifiers. Discussed confidentiality: Yes   I discussed the limitations of telemedicine and the availability of in person appointments.  Discussed there is a possibility of technology failure and discussed alternative modes of communication if that failure occurs.  I discussed that engaging in this telemedicine visit, they consent to the provision of behavioral healthcare and the services will be billed under their insurance.  Patient and/or legal guardian expressed understanding and consented to Telemedicine visit: Yes   Presenting Concerns: Patient and/or family reports the following symptoms/concerns: Mild anxiety and fatigue that is resolving postpartum, with good support at home and work,  sleeping and eating well; requests [facility] referral; open to implementing self-coping strategy for stress management/BP.  Duration of problem: Recent pregnancy; Severity of problem: mild  Patient and/or Family's Strengths/Protective Factors: Social connections, Concrete supports in place (healthy food, safe environments, etc.), Sense of purpose, and Physical Health (exercise, healthy diet, medication compliance, etc.)  Goals  Addressed: Patient will:  Reduce symptoms of: anxiety   Increase knowledge and/or ability of: coping skills   Demonstrate ability to: Increase healthy adjustment to current life circumstances  Progress towards Goals: Achieved  Interventions: Interventions utilized:  Mindfulness or Psychologist, educational, Psychoeducation and/or Health Education, and Link to [employer] Standardized Assessments completed: GAD-7 and PHQ 9  Patient and/or Family Response: Pt agrees with treatment plan  Assessment: Patient currently experiencing Other specified counseling.   Patient may benefit from psychoeducation and brief therapeutic interventions regarding coping with symptoms of anxiety .  Plan: Follow up with behavioral health clinician on : Call Laetitia Schnepf at [phone], as needed Behavioral recommendations:  -CALM relaxation breathing exercise twice daily (morning; at bedtime with sleep sounds); consider taking BP before and after to notice any change -Prioritize sleeping when baby sleeps next week, as baby goes through next growth spurt -Consider new mom/dad support groups (on AVS) Referral(s): Integrated Orthoptist (In Clinic) and [employer]:  new mom/dad support  I discussed the assessment and treatment plan with the patient and/or parent/guardian. They were provided an opportunity to ask questions and all were answered. They agreed with the plan and demonstrated an understanding of the instructions.   They were advised to call back or seek an in-person evaluation if the symptoms worsen or if the condition fails to improve as anticipated.  Garlan Fair, LCSW  Depression screen [facility] 2/9 09/01/2021 08/20/2021 08/12/2021 07/29/2021 05/07/2021  Decreased Interest 0 0 0 1 1  Down, Depressed, Hopeless 0 1 1 1  0  PHQ - 2 Score 0 1 1 2 1   Altered sleeping 1 3 3 3 1   Tired, decreased energy 0 0 2 2 1   Change in appetite 1 0 2 3 2   Feeling bad or failure about yourself   0 0 1  1 0  Trouble concentrating 0 0 0 0 0  Moving slowly or fidgety/restless 0 0 0 0 0  Suicidal thoughts 0 0 0 0 0  PHQ-9 Score 2 4 9 11 5   Difficult doing work/chores - - - - -  Some recent data might be hidden   GAD 7 : Generalized Anxiety Score 09/01/2021 08/20/2021 08/12/2021 07/29/2021  Nervous, Anxious, on Edge 0 2 1 3   Control/stop worrying 1 1 1 2   Worry too much - different things 0 1 1 2   Trouble relaxing 0 1 1 2   Restless 0 0 0 1  Easily annoyed or irritable 1 3 2 3   Afraid - awful might happen 0 1 1 2   Total GAD 7 Score 2 9 7  15

## 2021-08-20 ENCOUNTER — Encounter: Payer: Self-pay | Admitting: Obstetrics & Gynecology

## 2021-08-20 ENCOUNTER — Other Ambulatory Visit: Payer: Self-pay

## 2021-08-20 ENCOUNTER — Encounter (HOSPITAL_COMMUNITY): Payer: Self-pay | Admitting: Obstetrics & Gynecology

## 2021-08-20 ENCOUNTER — Ambulatory Visit (INDEPENDENT_AMBULATORY_CARE_PROVIDER_SITE_OTHER): Payer: Medicaid Other

## 2021-08-20 ENCOUNTER — Ambulatory Visit (INDEPENDENT_AMBULATORY_CARE_PROVIDER_SITE_OTHER): Payer: Medicaid Other | Admitting: Obstetrics & Gynecology

## 2021-08-20 VITALS — BP 147/87 | HR 108 | Wt 198.7 lb

## 2021-08-20 DIAGNOSIS — O119 Pre-existing hypertension with pre-eclampsia, unspecified trimester: Secondary | ICD-10-CM

## 2021-08-20 DIAGNOSIS — O0990 Supervision of high risk pregnancy, unspecified, unspecified trimester: Secondary | ICD-10-CM

## 2021-08-20 DIAGNOSIS — Z3A37 37 weeks gestation of pregnancy: Secondary | ICD-10-CM

## 2021-08-20 DIAGNOSIS — O24415 Gestational diabetes mellitus in pregnancy, controlled by oral hypoglycemic drugs: Secondary | ICD-10-CM

## 2021-08-20 DIAGNOSIS — Z98891 History of uterine scar from previous surgery: Secondary | ICD-10-CM

## 2021-08-20 DIAGNOSIS — Z8759 Personal history of other complications of pregnancy, childbirth and the puerperium: Secondary | ICD-10-CM

## 2021-08-20 NOTE — Patient Instructions (Signed)
You are scheduled to come to [facility] for induction of labor at 11:45 pm on Friday 08/21/2021

## 2021-08-20 NOTE — Addendum Note (Signed)
Addended by: Shelly Coss on: 08/20/2021 01:40 PM   Modules accepted: Orders

## 2021-08-20 NOTE — Progress Notes (Signed)
° °  PRENATAL VISIT NOTE  Subjective:  Morgan Newton is a 36 y.o. [phone] at 36w5dbeing seen today for ongoing prenatal care.  She is currently monitored for the following issues for this high-risk pregnancy and has Supervision of high risk pregnancy, antepartum; History of IUFD; History of cesarean delivery; Chronic hypertension; and GDM (gestational diabetes mellitus) on their problem list.  Patient reports heart palpitations the last several night especially when laying down. States she feels like her heart rate is high even when she is just laying in bed. No CP/SOB.  Contractions: Irritability. Vag. Bleeding: None.  Movement: Present. Denies leaking of fluid.   The following portions of the patient's history were reviewed and updated as appropriate: allergies, current medications, past family history, past medical history, past social history, past surgical history and problem list.   Objective:   Vitals:   08/20/21 1048  BP: (!) 147/87  Pulse: (!) 108  Weight: 198 lb 11.2 oz (90.1 kg)    Fetal Status: Fetal Heart Rate (bpm): 143   Movement: Present     General:  Alert, oriented and cooperative. Patient is in no acute distress.  Skin: Skin is warm and dry. No rash noted.   Cardiovascular: Normal heart rate noted  Respiratory: Normal respiratory effort, no problems with respiration noted  Abdomen: Soft, gravid, appropriate for gestational age.  Pain/Pressure: Present     Pelvic: Cervical exam deferred        Extremities: Normal range of motion.  Edema: Trace  Mental Status: Normal mood and affect. Normal behavior. Normal judgment and thought content.   Assessment and Plan:  Pregnancy: [id number] 338w5d. Chronic hypertension with concern for superimposed preeclampsia Recommended IOL today or very soon, she declined today.  BPP needs to be done today if not being induced today, she agrees with this plan.  She was added on induction schedule on 08/22/2021 at midnight, will go to  hospital at 11:45 pm tomorrow night.  Orders signed and held. No severe features, will check labs today. Strict FM and PEC precautions advised.  - Comprehensive metabolic panel - CBC - Protein / creatinine ratio, urine - Fetal nonstress test - [place] BPP W/NONSTRESS; Future  2. Gestational diabetes mellitus (GDM) in third trimester controlled on oral hypoglycemic drug Fasting sugars 78-84, 2 hour- 80-120, Dinner time 110's  Continue Metformin  3. History of IUFD Will be induced at [id number]  4. History of cesarean delivery Desires TOLAC, consent signed 07/22/2021  5. [redacted] weeks gestation of pregnancy 6. Supervision of high risk pregnancy, antepartum Preterm labor symptoms and general obstetric precautions including but not limited to vaginal bleeding, contractions, leaking of fluid and fetal movement were reviewed in detail with the patient. Please refer to After Visit Summary for other counseling recommendations.   Return for Postpartum check.  Future Appointments  Date Time Provider [place]2/10/2021 12:00 AM [facility] [place] None  09/01/2021  9:15 AM [facility]/03/2022 10:35 AM ErChancy MilroyMD [facility]  UgVerita SchneidersMD

## 2021-08-21 ENCOUNTER — Encounter: Payer: Self-pay | Admitting: Obstetrics & Gynecology

## 2021-08-21 ENCOUNTER — Other Ambulatory Visit: Payer: Self-pay

## 2021-08-21 LAB — CBC
Hematocrit: 35.8 % (ref 34.0–46.6)
Hemoglobin: 12 g/dL (ref 11.1–15.9)
MCH: 30.7 pg (ref 26.6–33.0)
MCHC: 33.5 g/dL (ref 31.5–35.7)
MCV: 92 fL (ref 79–97)
Platelets: 329 10*3/uL (ref 150–450)
RBC: 3.91 x10E6/uL (ref 3.77–5.28)
RDW: 13 % (ref 11.7–15.4)
WBC: 9.4 10*3/uL (ref 3.4–10.8)

## 2021-08-21 LAB — COMPREHENSIVE METABOLIC PANEL
ALT: 19 IU/L (ref 0–32)
AST: 22 IU/L (ref 0–40)
Albumin/Globulin Ratio: 1.4 (ref 1.2–2.2)
Albumin: 3.4 g/dL — ABNORMAL LOW (ref 3.8–4.8)
Alkaline Phosphatase: 174 IU/L — ABNORMAL HIGH (ref 44–121)
BUN/Creatinine Ratio: 5 — ABNORMAL LOW (ref 9–23)
BUN: 3 mg/dL — ABNORMAL LOW (ref 6–20)
Bilirubin Total: 0.3 mg/dL (ref 0.0–1.2)
CO2: 19 mmol/L — ABNORMAL LOW (ref 20–29)
Calcium: 8.9 mg/dL (ref 8.7–10.2)
Chloride: 105 mmol/L (ref 96–106)
Creatinine, Ser: 0.56 mg/dL — ABNORMAL LOW (ref 0.57–1.00)
Globulin, Total: 2.4 g/dL (ref 1.5–4.5)
Glucose: 79 mg/dL (ref 70–99)
Potassium: 4 mmol/L (ref 3.5–5.2)
Sodium: 137 mmol/L (ref 134–144)
Total Protein: 5.8 g/dL — ABNORMAL LOW (ref 6.0–8.5)
eGFR: 123 mL/min/{1.73_m2} (ref >59)

## 2021-08-21 LAB — PROTEIN / CREATININE RATIO, URINE
Creatinine, Urine: 57.4 mg/dL
Protein, Ur: 41.5 mg/dL
Protein/Creat Ratio: 723 mg/g creat — ABNORMAL HIGH (ref 0–200)

## 2021-08-22 ENCOUNTER — Inpatient Hospital Stay (HOSPITAL_COMMUNITY): Payer: Medicaid Other

## 2021-08-22 ENCOUNTER — Inpatient Hospital Stay (HOSPITAL_COMMUNITY): Payer: Medicaid Other | Admitting: Anesthesiology

## 2021-08-22 ENCOUNTER — Inpatient Hospital Stay (HOSPITAL_COMMUNITY)
Admission: AD | Admit: 2021-08-22 | Discharge: 2021-08-24 | DRG: 797 | Disposition: A | Discharge status: Home / Self Care | Payer: Medicaid Other | Attending: Obstetrics and Gynecology | Admitting: Obstetrics and Gynecology

## 2021-08-22 DIAGNOSIS — Z3A38 38 weeks gestation of pregnancy: Secondary | ICD-10-CM

## 2021-08-22 DIAGNOSIS — O09293 Supervision of pregnancy with other poor reproductive or obstetric history, third trimester: Secondary | ICD-10-CM | POA: Diagnosis not present

## 2021-08-22 DIAGNOSIS — O1002 Pre-existing essential hypertension complicating childbirth: Secondary | ICD-10-CM | POA: Diagnosis not present

## 2021-08-22 DIAGNOSIS — F17210 Nicotine dependence, cigarettes, uncomplicated: Secondary | ICD-10-CM | POA: Diagnosis present

## 2021-08-22 DIAGNOSIS — O0990 Supervision of high risk pregnancy, unspecified, unspecified trimester: Secondary | ICD-10-CM

## 2021-08-22 DIAGNOSIS — I10 Essential (primary) hypertension: Secondary | ICD-10-CM | POA: Diagnosis present

## 2021-08-22 DIAGNOSIS — O164 Unspecified maternal hypertension, complicating childbirth: Secondary | ICD-10-CM | POA: Diagnosis not present

## 2021-08-22 DIAGNOSIS — Z20822 Contact with and (suspected) exposure to covid-19: Secondary | ICD-10-CM | POA: Diagnosis not present

## 2021-08-22 DIAGNOSIS — Z9079 Acquired absence of other genital organ(s): Secondary | ICD-10-CM

## 2021-08-22 DIAGNOSIS — Z880 Allergy status to penicillin: Secondary | ICD-10-CM

## 2021-08-22 DIAGNOSIS — O1092 Unspecified pre-existing hypertension complicating childbirth: Secondary | ICD-10-CM | POA: Diagnosis present

## 2021-08-22 DIAGNOSIS — O114 Pre-existing hypertension with pre-eclampsia, complicating childbirth: Secondary | ICD-10-CM | POA: Diagnosis not present

## 2021-08-22 DIAGNOSIS — O34219 Maternal care for unspecified type scar from previous cesarean delivery: Secondary | ICD-10-CM | POA: Diagnosis not present

## 2021-08-22 DIAGNOSIS — Z8759 Personal history of other complications of pregnancy, childbirth and the puerperium: Secondary | ICD-10-CM

## 2021-08-22 DIAGNOSIS — O24419 Gestational diabetes mellitus in pregnancy, unspecified control: Secondary | ICD-10-CM | POA: Diagnosis present

## 2021-08-22 DIAGNOSIS — O24425 Gestational diabetes mellitus in childbirth, controlled by oral hypoglycemic drugs: Secondary | ICD-10-CM | POA: Diagnosis not present

## 2021-08-22 DIAGNOSIS — O24424 Gestational diabetes mellitus in childbirth, insulin controlled: Secondary | ICD-10-CM | POA: Diagnosis not present

## 2021-08-22 DIAGNOSIS — O99334 Smoking (tobacco) complicating childbirth: Secondary | ICD-10-CM | POA: Diagnosis not present

## 2021-08-22 DIAGNOSIS — Z302 Encounter for sterilization: Secondary | ICD-10-CM

## 2021-08-22 DIAGNOSIS — O34211 Maternal care for low transverse scar from previous cesarean delivery: Secondary | ICD-10-CM | POA: Diagnosis not present

## 2021-08-22 DIAGNOSIS — O1404 Mild to moderate pre-eclampsia, complicating childbirth: Secondary | ICD-10-CM | POA: Diagnosis not present

## 2021-08-22 DIAGNOSIS — O10919 Unspecified pre-existing hypertension complicating pregnancy, unspecified trimester: Secondary | ICD-10-CM | POA: Diagnosis present

## 2021-08-22 DIAGNOSIS — Z98891 History of uterine scar from previous surgery: Secondary | ICD-10-CM

## 2021-08-22 LAB — GLUCOSE, CAPILLARY
Glucose-Capillary: 84 mg/dL (ref 70–99)
Glucose-Capillary: 136 mg/dL — ABNORMAL HIGH (ref 70–99)
Glucose-Capillary: 80 mg/dL (ref 70–99)
Glucose-Capillary: 65 mg/dL — ABNORMAL LOW (ref 70–99)
Glucose-Capillary: 65 mg/dL — ABNORMAL LOW (ref 70–99)
Glucose-Capillary: 70 mg/dL (ref 70–99)
Glucose-Capillary: 82 mg/dL (ref 70–99)

## 2021-08-22 LAB — COMPREHENSIVE METABOLIC PANEL
ALT: 23 U/L (ref 0–44)
AST: 29 U/L (ref 15–41)
Albumin: 2.8 g/dL — ABNORMAL LOW (ref 3.5–5.0)
Alkaline Phosphatase: 180 U/L — ABNORMAL HIGH (ref 38–126)
Anion gap: 11 (ref 5–15)
BUN: 7 mg/dL (ref 6–20)
CO2: 19 mmol/L — ABNORMAL LOW (ref 22–32)
Calcium: 9.2 mg/dL (ref 8.9–10.3)
Chloride: 104 mmol/L (ref 98–111)
Creatinine, Ser: 0.66 mg/dL (ref 0.44–1.00)
GFR, Estimated: >60 mL/min (ref >60)
Glucose, Bld: 75 mg/dL (ref 70–99)
Potassium: 3.5 mmol/L (ref 3.5–5.1)
Sodium: 134 mmol/L — ABNORMAL LOW (ref 135–145)
Total Bilirubin: 0.5 mg/dL (ref 0.3–1.2)
Total Protein: 5.9 g/dL — ABNORMAL LOW (ref 6.5–8.1)

## 2021-08-22 LAB — RESP PANEL BY RT-PCR (FLU A&B, COVID) ARPGX2
Influenza A by PCR: NEGATIVE
Influenza B by PCR: NEGATIVE
SARS Coronavirus 2 by RT PCR: NEGATIVE

## 2021-08-22 LAB — CBC
HCT: 37.6 % (ref 36.0–46.0)
HCT: 37.4 % (ref 36.0–46.0)
Hemoglobin: 12.6 g/dL (ref 12.0–15.0)
Hemoglobin: 12.9 g/dL (ref 12.0–15.0)
MCH: 30.7 pg (ref 26.0–34.0)
MCH: 31.2 pg (ref 26.0–34.0)
MCHC: 33.5 g/dL (ref 30.0–36.0)
MCHC: 34.5 g/dL (ref 30.0–36.0)
MCV: 91.5 fL (ref 80.0–100.0)
MCV: 90.3 fL (ref 80.0–100.0)
Platelets: 336 10*3/uL (ref 150–400)
Platelets: 314 10*3/uL (ref 150–400)
RBC: 4.11 MIL/uL (ref 3.87–5.11)
RBC: 4.14 MIL/uL (ref 3.87–5.11)
RDW: 13.1 % (ref 11.5–15.5)
RDW: 13 % (ref 11.5–15.5)
WBC: 9.1 10*3/uL (ref 4.0–10.5)
WBC: 6.4 10*3/uL (ref 4.0–10.5)
nRBC: 0 % (ref 0.0–0.2)
nRBC: 0 % (ref 0.0–0.2)

## 2021-08-22 LAB — TYPE AND SCREEN
ABO/RH(D): O POS
Antibody Screen: NEGATIVE

## 2021-08-22 LAB — RPR: RPR Ser Ql: NONREACTIVE

## 2021-08-22 MED ORDER — HYDROXYZINE HCL 50 MG PO TABS: 50.0000 mg | ORAL_TABLET | Freq: Four times a day (QID) | ORAL | Status: DC | PRN

## 2021-08-22 MED ORDER — OXYTOCIN-SODIUM CHLORIDE 30-0.9 UT/500ML-% IV SOLN: 2.5000 [IU]/h | INTRAVENOUS | Status: DC

## 2021-08-22 MED ORDER — ZOLPIDEM TARTRATE 5 MG PO TABS: 5.0000 mg | ORAL_TABLET | Freq: Every evening | ORAL | Status: DC | PRN

## 2021-08-22 MED ORDER — PHENYLEPHRINE 40 MCG/ML (10ML) SYRINGE FOR IV PUSH (FOR BLOOD PRESSURE SUPPORT)
80.0000 ug | PREFILLED_SYRINGE | INTRAVENOUS | Status: AC | PRN
  Administered 2021-08-22 (×3): 80 ug via INTRAVENOUS

## 2021-08-22 MED ORDER — TERBUTALINE SULFATE 1 MG/ML IJ SOLN: 0.2500 mg | Freq: Once | INTRAMUSCULAR | Status: DC | PRN

## 2021-08-22 MED ORDER — OXYCODONE-ACETAMINOPHEN 5-325 MG PO TABS: 1.0000 | ORAL_TABLET | ORAL | Status: DC | PRN

## 2021-08-22 MED ORDER — EPHEDRINE 5 MG/ML INJ
10.0000 mg | INTRAVENOUS | Status: DC | PRN
  Filled 2021-08-22: qty 5

## 2021-08-22 MED ORDER — LACTATED RINGERS IV SOLN: 500.0000 mL | Freq: Once | INTRAVENOUS | Status: DC

## 2021-08-22 MED ORDER — EPHEDRINE 5 MG/ML INJ
10.0000 mg | INTRAVENOUS | Status: DC | PRN
  Administered 2021-08-22: 10 mg via INTRAVENOUS

## 2021-08-22 MED ORDER — FLEET ENEMA 7-19 GM/118ML RE ENEM: 1.0000 | ENEMA | Freq: Every day | RECTAL | Status: DC | PRN

## 2021-08-22 MED ORDER — LABETALOL HCL 5 MG/ML IV SOLN: 80.0000 mg | INTRAVENOUS | Status: DC | PRN

## 2021-08-22 MED ORDER — LACTATED RINGERS IV SOLN
500.0000 mL | INTRAVENOUS | Status: DC | PRN
  Administered 2021-08-22 (×3): 500 mL via INTRAVENOUS

## 2021-08-22 MED ORDER — OXYTOCIN BOLUS FROM INFUSION
333.0000 mL | Freq: Once | INTRAVENOUS | Status: AC
  Administered 2021-08-22: 333 mL via INTRAVENOUS

## 2021-08-22 MED ORDER — PHENYLEPHRINE 40 MCG/ML (10ML) SYRINGE FOR IV PUSH (FOR BLOOD PRESSURE SUPPORT)
80.0000 ug | PREFILLED_SYRINGE | INTRAVENOUS | Status: DC | PRN
  Filled 2021-08-22: qty 10

## 2021-08-22 MED ORDER — LIDOCAINE HCL (PF) 1 % IJ SOLN: 30.0000 mL | INTRAMUSCULAR | Status: DC | PRN

## 2021-08-22 MED ORDER — LACTATED RINGERS IV SOLN: INTRAVENOUS | Status: DC

## 2021-08-22 MED ORDER — LABETALOL HCL 5 MG/ML IV SOLN: 40.0000 mg | INTRAVENOUS | Status: DC | PRN

## 2021-08-22 MED ORDER — OXYTOCIN-SODIUM CHLORIDE 30-0.9 UT/500ML-% IV SOLN
1.0000 m[IU]/min | INTRAVENOUS | Status: DC
  Administered 2021-08-22: 1 m[IU]/min via INTRAVENOUS
  Filled 2021-08-22: qty 500

## 2021-08-22 MED ORDER — SOD CITRATE-CITRIC ACID 500-334 MG/5ML PO SOLN: 30.0000 mL | ORAL | Status: DC | PRN

## 2021-08-22 MED ORDER — LABETALOL HCL 5 MG/ML IV SOLN: 20.0000 mg | INTRAVENOUS | Status: DC | PRN

## 2021-08-22 MED ORDER — OXYTOCIN-SODIUM CHLORIDE 30-0.9 UT/500ML-% IV SOLN
1.0000 m[IU]/min | INTRAVENOUS | Status: DC
  Administered 2021-08-22: 8 m[IU]/min via INTRAVENOUS
  Filled 2021-08-22: qty 500

## 2021-08-22 MED ORDER — DIPHENHYDRAMINE HCL 50 MG/ML IJ SOLN: 12.5000 mg | INTRAMUSCULAR | Status: DC | PRN

## 2021-08-22 MED ORDER — ACETAMINOPHEN 325 MG PO TABS
650.0000 mg | ORAL_TABLET | ORAL | Status: DC | PRN
  Administered 2021-08-22: 650 mg via ORAL
  Filled 2021-08-22: qty 2

## 2021-08-22 MED ORDER — NIFEDIPINE ER OSMOTIC RELEASE 30 MG PO TB24
30.0000 mg | ORAL_TABLET | Freq: Two times a day (BID) | ORAL | Status: DC
  Administered 2021-08-22: 30 mg via ORAL
  Filled 2021-08-22 (×2): qty 1

## 2021-08-22 MED ORDER — OXYCODONE-ACETAMINOPHEN 5-325 MG PO TABS: 2.0000 | ORAL_TABLET | ORAL | Status: DC | PRN

## 2021-08-22 MED ORDER — LIDOCAINE HCL (PF) 1 % IJ SOLN
INTRAMUSCULAR | Status: DC | PRN
  Administered 2021-08-22: 6 mL via EPIDURAL

## 2021-08-22 MED ORDER — FENTANYL-BUPIVACAINE-NACL 0.5-0.125-0.9 MG/250ML-% EP SOLN
12.0000 mL/h | EPIDURAL | Status: DC | PRN
  Administered 2021-08-22: 12 mL/h via EPIDURAL
  Filled 2021-08-22: qty 250

## 2021-08-22 MED ORDER — TRANEXAMIC ACID-NACL 1000-0.7 MG/100ML-% IV SOLN
INTRAVENOUS | Status: AC
  Administered 2021-08-22: 1000 mg via INTRAVENOUS
  Filled 2021-08-22: qty 100

## 2021-08-22 MED ORDER — FENTANYL CITRATE (PF) 100 MCG/2ML IJ SOLN
50.0000 ug | INTRAMUSCULAR | Status: DC | PRN
  Administered 2021-08-22 (×2): 100 ug via INTRAVENOUS
  Administered 2021-08-22: 50 ug via INTRAVENOUS
  Administered 2021-08-22: 100 ug via INTRAVENOUS
  Filled 2021-08-22 (×4): qty 2

## 2021-08-22 MED ORDER — TRANEXAMIC ACID-NACL 1000-0.7 MG/100ML-% IV SOLN: 1000.0000 mg | Freq: Once | INTRAVENOUS | Status: AC

## 2021-08-22 MED ORDER — ONDANSETRON HCL 4 MG/2ML IJ SOLN
4.0000 mg | Freq: Four times a day (QID) | INTRAMUSCULAR | Status: DC | PRN
  Administered 2021-08-22 (×2): 4 mg via INTRAVENOUS
  Filled 2021-08-22 (×2): qty 2

## 2021-08-22 MED ORDER — HYDRALAZINE HCL 20 MG/ML IJ SOLN: 10.0000 mg | INTRAMUSCULAR | Status: DC | PRN

## 2021-08-22 NOTE — Anesthesia Procedure Notes (Signed)
Epidural Patient location during procedure: OB Start time: 08/22/2021 12:20 PM End time: 08/22/2021 12:30 PM  Staffing Anesthesiologist: Merlinda Frederick, MD Performed: anesthesiologist   Preanesthetic Checklist Completed: patient identified, IV checked, site marked, risks and benefits discussed, monitors and equipment checked, pre-op evaluation and timeout performed  Epidural Patient position: sitting Prep: DuraPrep Patient monitoring: heart rate, cardiac monitor, continuous pulse ox and blood pressure Approach: midline Location: L2-L3 Injection technique: LOR saline  Needle:  Needle type: Tuohy  Needle gauge: 17 G Needle length: 9 cm Needle insertion depth: 7 cm Catheter type: closed end flexible Catheter size: 20 Guage Catheter at skin depth: 12 cm Test dose: negative and Other  Assessment Events: blood not aspirated, injection not painful, no injection resistance and negative IV test  Additional Notes Informed consent obtained prior to proceeding including risk of failure, 1% risk of PDPH, risk of minor discomfort and bruising.  Discussed rare but serious complications including epidural abscess, permanent nerve injury, epidural hematoma.  Discussed alternatives to epidural analgesia and patient desires to proceed.  Timeout performed pre-procedure verifying patient name, procedure, and platelet count.  Patient tolerated procedure well.

## 2021-08-22 NOTE — Anesthesia Preprocedure Evaluation (Signed)
Anesthesia Evaluation  Patient identified by MRN, date of birth, ID band Patient awake    Reviewed: Allergy & Precautions, NPO status , Patient's Chart, lab work & pertinent test results  Airway Mallampati: II  TM Distance: >3 FB Neck ROM: Full    Dental no notable dental hx.    Pulmonary asthma , Current Smoker,    Pulmonary exam normal breath sounds clear to auscultation       Cardiovascular hypertension, Pt. on medications Normal cardiovascular exam Rhythm:Regular Rate:Normal     Neuro/Psych PSYCHIATRIC DISORDERS Depression negative neurological ROS     GI/Hepatic negative GI ROS, Neg liver ROS,   Endo/Other  diabetes, Gestational, Oral Hypoglycemic Agents  Renal/GU negative Renal ROS  negative genitourinary   Musculoskeletal negative musculoskeletal ROS (+)   Abdominal   Peds negative pediatric ROS (+)  Hematology  (+) Blood dyscrasia, anemia ,   Anesthesia Other Findings   Reproductive/Obstetrics (+) Pregnancy                             Anesthesia Physical Anesthesia Plan  ASA: 3  Anesthesia Plan: General and Epidural   Post-op Pain Management:    Induction:   PONV Risk Score and Plan: 2  Airway Management Planned: Natural Airway  Additional Equipment:   Intra-op Plan:   Post-operative Plan:   Informed Consent: I have reviewed the patients History and Physical, chart, labs and discussed the procedure including the risks, benefits and alternatives for the proposed anesthesia with the patient or authorized representative who has indicated his/her understanding and acceptance.       Plan Discussed with: Anesthesiologist  Anesthesia Plan Comments:         Anesthesia Quick Evaluation

## 2021-08-22 NOTE — Progress Notes (Addendum)
Labor Progress Note Morgan Newton is a 36 y.o. [phone] at 16w0dpresenting for TOLAC/IOL for A2GDM and cHTN with SIPE  S: She has been trying different positions and feels her contractions are getting stronger.   O:  BP (!) 152/90    Pulse 86    Temp 98.5 F (36.9 C) (Oral)    Resp 17    LMP 08/11/2020 (Exact Date)   Fetal heart rate: 120 baseline, moderate variability, accelerations present, no decelerations Contractions every 2-3 mins CVE: Dilation: 2 Effacement (%): 50 Station: -2 Exam by:: Dr. DCy BlamerUterus anteflexed   A&P: 36y.o. [id number] for TOLAC/IOL for A2GDM and cHTN with SIPE  #Labor: Cervix now 2 cm in external os and internal os ~1cm. And now suspect SROM. Some variables after SROM, resolved with fluid bolus. Inserted cooks during this check and continue low dose pitocin to max of 8 while balloon still in. #Pain: IV fentanyl, planning for epidural #FWB: Category I #GBS negative #cHTN with SIPE: Continue Procardia 30 mg BID #A2GDM: q4h CBG and then q2h once more active  AnReggy EyeMedical Student 7:52 AM  GME ATTESTATION:  I saw and evaluated the patient. I agree with the findings and the plan of care as documented in the students note and have made all necessary edits.  AnRenard MatterMD, MPH OB Fellow, FaNorth Haverhillor WoFort Cobb/10/2021 8:49 AM

## 2021-08-22 NOTE — H&P (Addendum)
OBSTETRIC ADMISSION HISTORY AND PHYSICAL  Morgan Newton is a 36 y.o. female [phone] with IUP at 50w0dby U/S presenting for TOLAC/IOL for A2GDM (on Metformin)and chronic HTN with superimposed pre-eclampsia(on Procardia 350mBID, p:c 0.7 on 2/2). She reports +FMs, No LOF, no VB, no blurry vision, or peripheral edema, and RUQ pain. She has a mild headache that is improving and has been having stronger contractions since arriving at the hospital. She plans on breast feeding . She request BTL for birth control. [facility] U/S --->  Estimated Date of Delivery: 09/05/21  Sono:   @[redacted]w[redacted]d , CWD, normal anatomy, cephalic presentation, fundal lie, 2478g, 27% EFW   Prenatal History/Complications:  - Chronic HTN with superimposed pre-eclampsia, on procardia 30 mg BID - A2GDM on metformin - History of IUFD (in setting of preterm delivery at 28 weeks) - History of cesarean delivery, TOLAC  consent signed  Past Medical History: Past Medical History:  Diagnosis Date   Abnormal Pap smear 2012   colpo   Anemia    Asthma    Chlamydia    Depression    DUB (dysfunctional uterine bleeding) 02/04/2021   GDM (gestational diabetes mellitus) 04/03/2021   Genital herpes    Gonorrhea    Heart murmur    History of IUFD 03/03/2021   Hypertension    PIH   PID (acute pelvic inflammatory disease)    Pregnancy induced hypertension    Trichimoniasis     Past Surgical History: Past Surgical History:  Procedure Laterality Date   CESAREAN SECTION  07/22/2012   Procedure: CESAREAN SECTION;  Surgeon: BeFrederico HammanMD;  [facility];  Service: Obstetrics;  Laterality: N/A;  Primary Cesarean Section    GANGLION CYST EXCISION     MOUTH SURGERY      Obstetrical History: OB History     Gravida  9   Para  3   Term  2   Preterm  1   AB  5   Living  2      SAB  2   IAB  3   Ectopic      Multiple      Live Births  2            Social History Social History   Socioeconomic History   Marital status: Married    Spouse name: Not on file   Number of children: Not on file   Years of education: Not on file   Highest education level: Not on file  Occupational History   Not on file  Tobacco Use   Smoking status: Some Days    Packs/day: 0.25    Types: Cigarettes   Smokeless tobacco: Never  Vaping Use   Vaping Use: Never used  Substance and Sexual Activity   Alcohol use: Not Currently    Alcohol/week: 3.0 standard drinks    Types: 3 Standard drinks or equivalent per week    Comment: not during preg   Drug use: No   Sexual activity: Not Currently    Birth control/protection: None  Other Topics Concern   Not on file  Social History Narrative   Not on file   Social Determinants of Health   Financial Resource Strain: Not on file  Food Insecurity: Food Insecurity Present   Worried About RuMesicn the Last Year: Sometimes true   Ran Out of Food in the Last Year: Sometimes true  Transportation Needs: No Data processing manager (Medical): No   Lack of Transportation (Non-Medical): No  Physical Activity: Not on file  Stress: Not on file  Social Connections: Not on file    Family History: Family History  Problem Relation Age of Onset   Hypertension Maternal Grandfather    Hypertension Mother    Thyroid disease Mother    Hypertension Maternal Grandmother    Thyroid disease Maternal Grandmother    Asthma Father    Asthma Maternal Aunt    Asthma Paternal Grandmother    Other Neg Hx    Hearing loss Neg Hx     Allergies: Allergies  Allergen Reactions   Penicillins Shortness Of Breath and Palpitations    .Marland KitchenHas patient had a PCN reaction causing immediate rash, facial/tongue/throat swelling, SOB or lightheadedness with hypotension: NO Has patient had a PCN reaction causing severe rash involving mucus membranes or skin necrosis: NO Has patient had a PCN reaction  that required hospitalization No Has patient had a PCN reaction occurring within the last 10 years: No If all of the above answers are "NO", then may proceed with Cephalosporin use.    Caffeine Other (See Comments)    Heart murmur    Medications Prior to Admission  Medication Sig Dispense Refill Last Dose   aspirin EC 81 MG tablet Take 1 tablet (81 mg total) by mouth daily. Swallow whole. 30 tablet 11 08/21/2021   famotidine (PEPCID) 40 MG tablet Take 1 tablet (40 mg total) by mouth daily. 30 tablet 2 08/21/2021   metFORMIN (GLUCOPHAGE) 500 MG tablet Take 1 tablet (500 mg total) by mouth 2 (two) times daily with a meal. 60 tablet 5 08/21/2021   NIFEdipine (PROCARDIA-XL/NIFEDICAL-XL) 30 MG 24 hr tablet Take 1 tablet (30 mg total) by mouth in the morning and at bedtime. 60 tablet 11 08/21/2021   Prenatal Vit-Fe Fumarate-FA (PREPLUS) 27-1 MG TABS Take 1 tablet by mouth daily. 30 tablet 13 08/21/2021   Blood Pressure Monitoring DEVI 1 each by Does not apply route once a week. 1 each 0    polyethylene glycol powder (GLYCOLAX/MIRALAX) 17 GM/SCOOP powder Take 17 g by mouth daily as needed. 510 g 1      Review of Systems   All systems reviewed and negative except as stated in HPI  Blood pressure 140/75, pulse 77, temperature 98.7 F (37.1 C), temperature source Oral, resp. rate 17, last menstrual period 08/11/2020. General appearance: alert, cooperative, and no distress Lungs: clear to auscultation bilaterally Heart: regular rate and rhythm Abdomen: soft, non-tender; bowel sounds normal Extremities: Homans sign is negative, no sign of DVT Presentation: cephalic Fetal monitoringBaseline: 130 bpm, Variability: Good {> 6 bpm), Accelerations: Reactive, and Decelerations: Absent Uterine activityFrequency: Every 1-2 minutes Dilation: Closed Effacement (%): Thick Station: -3 Exam by:: Dr. Cy Blamer   Prenatal labs: ABO, Rh: --/--/O POS (02/04 0135) Antibody: NEG (02/04 0135) Rubella: 4.34 (08/24  1444) RPR: Non Reactive (12/16 0945)  HBsAg: Negative (08/24 1444)  HIV: Non Reactive (12/16 0945)  GBS: Negative/-- (01/25 1313)  2 hr Glucola abnormal Genetic screening normal Anatomy US normal  Prenatal Transfer Tool  Maternal Diabetes: Yes:  Diabetes Type:  Insulin/Medication controlled Genetic Screening: Normal Maternal Ultrasounds/Referrals: Normal Fetal Ultrasounds or other Referrals:  None Maternal Substance Abuse:  No Significant Maternal Medications:  Meds include: Other: Metformin, procardia, baby aspirin Significant Maternal Lab Results: Group B Strep negative  Results for orders placed or performed during the hospital encounter of 08/22/21 (from  the past 24 hour(s))  CBC   Collection Time: 08/22/21  1:30 AM  Result Value Ref Range   WBC 9.1 4.0 - 10.5 K/uL   RBC 4.11 3.87 - 5.11 MIL/uL   Hemoglobin 12.6 12.0 - 15.0 g/dL   HCT 37.6 36.0 - 46.0 %   MCV 91.5 80.0 - 100.0 fL   MCH 30.7 26.0 - 34.0 pg   MCHC 33.5 30.0 - 36.0 g/dL   RDW 13.1 11.5 - 15.5 %   Platelets 336 150 - 400 K/uL   nRBC 0.0 0.0 - 0.2 %  Comprehensive metabolic panel   Collection Time: 08/22/21  1:30 AM  Result Value Ref Range   Sodium 134 (L) 135 - 145 mmol/L   Potassium 3.5 3.5 - 5.1 mmol/L   Chloride 104 98 - 111 mmol/L   CO2 19 (L) 22 - 32 mmol/L   Glucose, Bld 75 70 - 99 mg/dL   BUN 7 6 - 20 mg/dL   Creatinine, Ser 0.66 0.44 - 1.00 mg/dL   Calcium 9.2 8.9 - 10.3 mg/dL   Total Protein 5.9 (L) 6.5 - 8.1 g/dL   Albumin 2.8 (L) 3.5 - 5.0 g/dL   AST 29 15 - 41 U/L   ALT 23 0 - 44 U/L   Alkaline Phosphatase 180 (H) 38 - 126 U/L   Total Bilirubin 0.5 0.3 - 1.2 mg/dL   GFR, Estimated >60 >60 mL/min   Anion gap 11 5 - 15  Type and screen   Collection Time: 08/22/21  1:35 AM  Result Value Ref Range   ABO/RH(D) O POS    Antibody Screen NEG    Sample Expiration      08/25/2021,2359 Performed at [facility], [address], [place], [place] [address]   Glucose, capillary    Collection Time: 08/22/21  2:36 AM  Result Value Ref Range   Glucose-Capillary 84 70 - 99 mg/dL    Patient Active Problem List   Diagnosis Date Noted   Chronic hypertension affecting pregnancy 08/22/2021   GDM (gestational diabetes mellitus) 04/03/2021   History of cesarean delivery 03/11/2021   Chronic hypertension 03/11/2021   Supervision of high risk pregnancy, antepartum 03/03/2021   History of IUFD 03/03/2021    Assessment/Plan:  Morgan Newton is a 36 y.o. [phone] at 52w0dhere for IOL for A2GDM and chronic HTN with superimposed pre-eclampsia.  #Labor/TOLAC:  Patient's cervix closed. Cat I tracing. Prior CS due to fetal bradycardia during IOL. Patient unsure how dilated she was. She has had two prior vaginal deliveries prior to CS. No prior VBACs. Discussed TOLAC with patient including rare but serious risk of uterine rupture as well as benefits of vaginal delivery. Patient confirms she continues to desire TOLAC. Given TOLAC will start with low dose pitocin. And recheck in 2-3 hours and assess for placement of foley balloon.   #Pain: Plans epidural #FWB: Category I #ID:  GBS negative #MOF: Breast milk #MOC: BTL (signed on 1/4) #Circ:  yes  #Hx of Demise Patient had prior demise at 236 weeks  #cHTN with SIPE Patient with cHTN on Procardia 363mBID. Protein:cr ratio 0.7 on 2/2. Last BPP 8/10.  - continue Procardia 302mID - plan for Lasix post partum   #A2GDM (on Metformin) On Metformin. Per patient BG within normal range (fasting below 90 and post prandials in 100s-110s). EFW 27%ile on 1/17 with AC 48%ile. Normal AFI.  - q4hr CBG and then q2hr once more active  [employer]  Doyne Keel, [place]  GME ATTESTATION:  I saw and evaluated the patient. I agree with the findings and the plan of care as documented in the student's note and I have made all necessary edits.  Renard Matter, MD, MPH OB Fellow, Faculty Practice

## 2021-08-22 NOTE — Progress Notes (Signed)
Morgan Newton is a 36 y.o. [phone] at 36w0dadmitted for induction of labor due to preE without SF and A2GDM.  Subjective: Reports feels comfortable with epidural. Not feeling contractions or pain at this time. Had questions about estimated fetal weight per last growth [place] Discussed that on 1/17 was 5lb 7 oz 27%ile which is normal  Objective: BP 96/66    Pulse 78    Temp 98 F (36.7 C) (Oral)    Resp 17    LMP 08/11/2020 (Exact Date)    SpO2 100%  No intake/output data recorded. Total I/O In: -  Out: 300 [Urine:300]  FHT:  FHR: 130 bpm, variability: moderate,  accelerations:  Present,  decelerations:  Absent UC:   regular, every 2-3 minutes SVE:   Dilation: 5.5 Effacement (%): 90 Station: -1 Exam by:: Dr. AGwenlyn Perking Labs: Lab Results  Component Value Date   WBC 6.4 08/22/2021   HGB 12.9 08/22/2021   HCT 37.4 08/22/2021   MCV 90.3 08/22/2021   PLT 314 08/22/2021    Assessment / Plan: [id number] 383w0ddmitted for induction of labor due to preE without SF and A2GDM.  Labor: Progressing on Pitocin with IUPC in place. Contractions adequate. Will hold off on check until 4 hrs with adequate contractions or patient feeling pressure Preeclampsia:  no signs or symptoms of toxicity Fetal Wellbeing:  Category I Pain Control:  Epidural I/D:   GBS neg    #A2GDM BG 82  Gaspare Netzel 08/22/2021, 10:02 PM

## 2021-08-22 NOTE — Progress Notes (Signed)
Labor Progress Note Morgan Newton is a 36 y.o. [phone] at 88w0dwho presented for IOL/TOLAC due to [place]  S: Doing well. Working on position changes with RTherapist, sports Mom at bedside. No concerns.   O:  BP (!) 103/56    Pulse 81    Temp 98 F (36.7 C) (Oral)    Resp 17    LMP 08/11/2020 (Exact Date)   EFM: Baseline 120 bpm, moderate variability, + accels, early decels Toco: Every 2-3 minutes, MVUs 220  CVE: Dilation: 5.5 Effacement (%): 90 Station: -1 Presentation: Vertex Exam by:: Dr. AGwenlyn Perking  A&P: 36y.o. [id number] #Labor: Progressing well. Fetal head feels somewhat asynclitic but improved from prior exam. Contraction pattern adequate. Will continue to titrate Pitocin as needed. Will continue position changes to help with fetal head engagement in pelvis. Will reassess in 4 hours, sooner as needed.  #Pain: Epidural  #FWB: Cat 1 #GBS negative  #cHTN with SIPE: Recent symptomatic low BP, improved with fluid bolus. Will hold home Procardia and continue to monitor closely.   #A2GDM: CBGs within normal range. Will continue q4hr glucose checks.   ChGenia DelMD 6:24 PM

## 2021-08-22 NOTE — Progress Notes (Signed)
Labor Progress Note Morgan Newton is a 36 y.o. [phone] at 49w0dwho presented for IOL/TOLAC due to [name]  S: Doing well. Now comfortable with epidural. No concerns at this time.  O:  BP 126/63    Pulse 72    Temp 98.5 F (36.9 C) (Oral)    Resp 17    LMP 08/11/2020 (Exact Date)   EFM: Baseline 115 bpm, moderate variability, + accels, early decels Toco: Every 2-3 minutes, MVUs 200  CVE: Dilation: 4.5 Effacement (%): 50 Station: -3 Presentation: Vertex Exam by:: SJaymes GraffRN   A&P: 36y.o. [id number] #Labor: Progressing well s/p Cook's balloon. IUPC placed this check without difficulty. Will continue to titrate Pitocin. Currently adequate per most recent MVUs. Will adjust as needed. Plan to reassess in 4-5 hours. #Pain: Epidural  #FWB: Cat 1  #GBS negative  #cHTN with SIPE: Normal to mild range BP. No symptoms. On Procardia 30 mg BID. Will continue to monitor.  #A2GDM: CBGs stable. Continue q4hr glucose checks.   ChGenia DelMD 1:42 PM

## 2021-08-23 ENCOUNTER — Encounter (HOSPITAL_COMMUNITY)
Admission: AD | Disposition: A | Discharge status: Home / Self Care | Payer: Self-pay | Source: Home / Self Care | Attending: Obstetrics and Gynecology

## 2021-08-23 ENCOUNTER — Other Ambulatory Visit: Payer: Self-pay

## 2021-08-23 ENCOUNTER — Encounter (HOSPITAL_COMMUNITY): Payer: Self-pay | Admitting: Obstetrics & Gynecology

## 2021-08-23 ENCOUNTER — Inpatient Hospital Stay (HOSPITAL_COMMUNITY): Payer: Medicaid Other | Admitting: Anesthesiology

## 2021-08-23 DIAGNOSIS — Z302 Encounter for sterilization: Secondary | ICD-10-CM | POA: Diagnosis not present

## 2021-08-23 HISTORY — PX: TUBAL LIGATION: SHX77

## 2021-08-23 LAB — GLUCOSE, CAPILLARY
Glucose-Capillary: 98 mg/dL (ref 70–99)
Glucose-Capillary: 90 mg/dL (ref 70–99)

## 2021-08-23 SURGERY — LIGATION, FALLOPIAN TUBE, POSTPARTUM
Anesthesia: Epidural

## 2021-08-23 MED ORDER — ONDANSETRON HCL 4 MG/2ML IJ SOLN
INTRAMUSCULAR | Status: DC | PRN
  Administered 2021-08-23: 4 mg via INTRAVENOUS

## 2021-08-23 MED ORDER — FAMOTIDINE 20 MG PO TABS
40.0000 mg | ORAL_TABLET | Freq: Once | ORAL | Status: AC
  Administered 2021-08-23: 40 mg via ORAL
  Filled 2021-08-23: qty 2

## 2021-08-23 MED ORDER — BUPIVACAINE HCL (PF) 0.5 % IJ SOLN
INTRAMUSCULAR | Status: AC
  Filled 2021-08-23: qty 30

## 2021-08-23 MED ORDER — METOCLOPRAMIDE HCL 10 MG PO TABS
10.0000 mg | ORAL_TABLET | Freq: Once | ORAL | Status: AC
  Administered 2021-08-23: 10 mg via ORAL
  Filled 2021-08-23: qty 1

## 2021-08-23 MED ORDER — MEASLES, MUMPS & RUBELLA VAC IJ SOLR: 0.5000 mL | Freq: Once | INTRAMUSCULAR | Status: DC

## 2021-08-23 MED ORDER — DIPHENHYDRAMINE HCL 25 MG PO CAPS: 25.0000 mg | ORAL_CAPSULE | Freq: Four times a day (QID) | ORAL | Status: DC | PRN

## 2021-08-23 MED ORDER — KETOROLAC TROMETHAMINE 30 MG/ML IJ SOLN
INTRAMUSCULAR | Status: DC | PRN
Start: 2021-08-23 — End: 2021-08-23
  Administered 2021-08-23: 30 mg via INTRAVENOUS

## 2021-08-23 MED ORDER — FENTANYL CITRATE (PF) 100 MCG/2ML IJ SOLN
INTRAMUSCULAR | Status: AC
  Filled 2021-08-23: qty 2

## 2021-08-23 MED ORDER — SIMETHICONE 80 MG PO CHEW
80.0000 mg | CHEWABLE_TABLET | ORAL | Status: DC | PRN
  Administered 2021-08-23 – 2021-08-24 (×2): 80 mg via ORAL
  Filled 2021-08-23 (×2): qty 1

## 2021-08-23 MED ORDER — LIDOCAINE-EPINEPHRINE (PF) 2 %-1:200000 IJ SOLN
INTRAMUSCULAR | Status: DC | PRN
  Administered 2021-08-23 (×2): 2 mL via INTRADERMAL
  Administered 2021-08-23: 3 mL via INTRADERMAL
  Administered 2021-08-23: 5 mL via INTRADERMAL
  Administered 2021-08-23: 2 mL via INTRADERMAL
  Administered 2021-08-23 (×2): 3 mL via INTRADERMAL

## 2021-08-23 MED ORDER — IBUPROFEN 600 MG PO TABS
600.0000 mg | ORAL_TABLET | Freq: Four times a day (QID) | ORAL | Status: DC
  Administered 2021-08-23 – 2021-08-24 (×5): 600 mg via ORAL
  Filled 2021-08-23 (×5): qty 1

## 2021-08-23 MED ORDER — ACETAMINOPHEN 325 MG PO TABS
650.0000 mg | ORAL_TABLET | ORAL | Status: DC | PRN
  Administered 2021-08-23 – 2021-08-24 (×4): 650 mg via ORAL
  Filled 2021-08-23 (×4): qty 2

## 2021-08-23 MED ORDER — PRENATAL MULTIVITAMIN CH
1.0000 | ORAL_TABLET | Freq: Every day | ORAL | Status: DC
  Administered 2021-08-24: 1 via ORAL
  Filled 2021-08-23: qty 1

## 2021-08-23 MED ORDER — MEDROXYPROGESTERONE ACETATE 150 MG/ML IM SUSP: 150.0000 mg | INTRAMUSCULAR | Status: DC | PRN

## 2021-08-23 MED ORDER — ONDANSETRON HCL 4 MG/2ML IJ SOLN: 4.0000 mg | INTRAMUSCULAR | Status: DC | PRN

## 2021-08-23 MED ORDER — OXYCODONE HCL 5 MG PO TABS
5.0000 mg | ORAL_TABLET | ORAL | Status: DC | PRN
  Administered 2021-08-23 – 2021-08-24 (×4): 5 mg via ORAL
  Filled 2021-08-23 (×4): qty 1

## 2021-08-23 MED ORDER — ONDANSETRON HCL 4 MG PO TABS: 4.0000 mg | ORAL_TABLET | ORAL | Status: DC | PRN

## 2021-08-23 MED ORDER — DIBUCAINE (PERIANAL) 1 % EX OINT: 1.0000 "application " | TOPICAL_OINTMENT | CUTANEOUS | Status: DC | PRN

## 2021-08-23 MED ORDER — LACTATED RINGERS IV SOLN: INTRAVENOUS | Status: DC

## 2021-08-23 MED ORDER — NIFEDIPINE ER OSMOTIC RELEASE 30 MG PO TB24
30.0000 mg | ORAL_TABLET | Freq: Every day | ORAL | Status: DC
  Administered 2021-08-23: 30 mg via ORAL
  Filled 2021-08-23: qty 1

## 2021-08-23 MED ORDER — KETOROLAC TROMETHAMINE 30 MG/ML IJ SOLN: 30.0000 mg | Freq: Once | INTRAMUSCULAR | Status: DC | PRN

## 2021-08-23 MED ORDER — BUPIVACAINE HCL (PF) 0.5 % IJ SOLN
INTRAMUSCULAR | Status: DC | PRN
  Administered 2021-08-23: 10 mL

## 2021-08-23 MED ORDER — LACTATED RINGERS IV SOLN
INTRAVENOUS | Status: DC | PRN
Start: 2021-08-23 — End: 2021-08-23

## 2021-08-23 MED ORDER — TETANUS-DIPHTH-ACELL PERTUSSIS 5-2.5-18.5 LF-MCG/0.5 IM SUSY: 0.5000 mL | PREFILLED_SYRINGE | Freq: Once | INTRAMUSCULAR | Status: DC

## 2021-08-23 MED ORDER — WITCH HAZEL-GLYCERIN EX PADS: 1.0000 "application " | MEDICATED_PAD | CUTANEOUS | Status: DC | PRN

## 2021-08-23 MED ORDER — MIDAZOLAM HCL 5 MG/5ML IJ SOLN
INTRAMUSCULAR | Status: DC | PRN
  Administered 2021-08-23 (×2): 1 mg via INTRAVENOUS

## 2021-08-23 MED ORDER — SENNOSIDES-DOCUSATE SODIUM 8.6-50 MG PO TABS
2.0000 | ORAL_TABLET | Freq: Every day | ORAL | Status: DC
  Administered 2021-08-24: 2 via ORAL
  Filled 2021-08-23: qty 2

## 2021-08-23 MED ORDER — FENTANYL CITRATE (PF) 100 MCG/2ML IJ SOLN
INTRAMUSCULAR | Status: DC | PRN
  Administered 2021-08-23 (×2): 50 ug via INTRAVENOUS

## 2021-08-23 MED ORDER — BENZOCAINE-MENTHOL 20-0.5 % EX AERO
1.0000 "application " | INHALATION_SPRAY | CUTANEOUS | Status: DC | PRN
  Administered 2021-08-23: 1 via TOPICAL
  Filled 2021-08-23: qty 56

## 2021-08-23 MED ORDER — COCONUT OIL OIL
1.0000 "application " | TOPICAL_OIL | Status: DC | PRN
  Administered 2021-08-23: 1 via TOPICAL

## 2021-08-23 MED ORDER — FENTANYL CITRATE (PF) 100 MCG/2ML IJ SOLN
25.0000 ug | INTRAMUSCULAR | Status: DC | PRN
  Administered 2021-08-23: 50 ug via INTRAVENOUS

## 2021-08-23 MED ORDER — KETOROLAC TROMETHAMINE 30 MG/ML IJ SOLN
INTRAMUSCULAR | Status: AC
  Filled 2021-08-23: qty 1

## 2021-08-23 MED ORDER — MIDAZOLAM HCL 2 MG/2ML IJ SOLN
INTRAMUSCULAR | Status: AC
  Filled 2021-08-23: qty 2

## 2021-08-23 SURGICAL SUPPLY — 26 items
ADH SKN CLS APL DERMABOND .7 (GAUZE/BANDAGES/DRESSINGS) ×1
APL PRP STRL LF DISP 70% ISPRP (MISCELLANEOUS) ×1
CHLORAPREP W/TINT 26 (MISCELLANEOUS) ×2 IMPLANT
CLIP FILSHIE TUBAL LIGA STRL (Clip) ×1 IMPLANT
CLOTH BEACON ORANGE TIMEOUT ST (SAFETY) ×2 IMPLANT
DERMABOND ADVANCED (GAUZE/BANDAGES/DRESSINGS) ×1
DERMABOND ADVANCED .7 DNX12 (GAUZE/BANDAGES/DRESSINGS) IMPLANT
DRSG OPSITE POSTOP 3X4 (GAUZE/BANDAGES/DRESSINGS) ×2 IMPLANT
GLOVE BIOGEL PI IND STRL 7.0 (GLOVE) ×2 IMPLANT
GLOVE BIOGEL PI INDICATOR 7.0 (GLOVE) ×2
GLOVE SURG ENC MOIS LTX SZ6 (GLOVE) ×2 IMPLANT
GOWN STRL REUS W/TWL LRG LVL3 (GOWN DISPOSABLE) ×4 IMPLANT
MAT PREVALON FULL STRYKER (MISCELLANEOUS) ×1 IMPLANT
NEEDLE HYPO 22GX1.5 SAFETY (NEEDLE) ×2 IMPLANT
NS IRRIG 1000ML POUR BTL (IV SOLUTION) ×2 IMPLANT
PACK ABDOMINAL MINOR (CUSTOM PROCEDURE TRAY) ×2 IMPLANT
PROTECTOR NERVE ULNAR (MISCELLANEOUS) ×2 IMPLANT
SPONGE LAP 4X18 RFD (DISPOSABLE) IMPLANT
SUT MON AB-0 CT1 36 (SUTURE) IMPLANT
SUT VIC AB 0 CT1 27 (SUTURE) ×2
SUT VIC AB 0 CT1 27XBRD ANBCTR (SUTURE) ×1 IMPLANT
SUT VICRYL 4-0 PS2 18IN ABS (SUTURE) ×2 IMPLANT
SYR CONTROL 10ML LL (SYRINGE) ×2 IMPLANT
TOWEL OR 17X24 6PK STRL BLUE (TOWEL DISPOSABLE) ×4 IMPLANT
TRAY FOLEY CATH SILVER 14FR (SET/KITS/TRAYS/PACK) ×2 IMPLANT
WATER STERILE IRR 1000ML POUR (IV SOLUTION) ×2 IMPLANT

## 2021-08-23 NOTE — Progress Notes (Addendum)
Post Partum Day 1 Subjective: Morgan Newton is a 36 y.o. [id number] 59w1ds/p VBAC.  Pt with A2GDM well controlled with metformin, cHTN with SIPE-well controlled on Procardia.  No acute events overnight.  Pt denies problems with ambulating, voiding or po intake.  She denies nausea or vomiting.  Pain is well controlled.  She has had flatus.  Lochia Minimal.  Plan for birth control is bilateral tubal ligation.  Method of Feeding: breast  Reports would like Circumcision for baby. Consent done and placed note in baby's chart.   Objective: Blood pressure 132/81, pulse (!) 106, temperature 99.2 F (37.3 C), temperature source Oral, resp. rate 18, last menstrual period 08/11/2020, SpO2 100 %. Brachial pulse: 107  Physical Exam:  General: alert, cooperative and no distress Lochia:normal flow Chest: normal WOB Heart: RRR w/o M/G/R Abdomen: +BS, soft, mild TTP (appropriate) Uterine Fundus: firm, @ U DVT Evaluation: No evidence of DVT seen on physical exam. Extremities: no edema  Recent Labs    08/22/21 0130 08/22/21 1132  HGB 12.6 12.9  HCT 37.6 37.4    Assessment/Plan: ASSESSMENT: Morgan HEGWOODis a 36y.o. [id number]/p VBAC.  A2GDM on metformin. cHTN w/ SIPE on Procoardia.   #S/P VBAC: doing well, continue routine postpartum care.  #A2GDM: Fasting BG will be done on PPD#2 AM when fasting. Plan for 6-12 week GTT testing to evaluate for underlying DM.  #cHTHN w/ SIPE: mild range BP's (130-140's/80-90's).  Pt asymptomatic.  Pregnancy med Procardia 301mID. Here some low normal BP so will do Procardia 30 mg daily and watch BP today. Can uptitrate as needed.Plan for 1  week PP BP check.    Plan for discharge tomorrow, Breastfeeding, and Contraception NPO for BTL @ 1400. Continue routine PP care Breastfeeding support PRN  LOS: 1 day   Morgan Newton 08/23/2021, 7:34 AM   GME ATTESTATION:  I saw and evaluated the patient. I agree with the findings and the plan of care as documented  in the students note and have made all necessary edits.  Morgan Newton, MPH OB Fellow, FacRichlandr WomMaroa5/2023 9:13 AM

## 2021-08-23 NOTE — Transfer of Care (Signed)
Immediate Anesthesia Transfer of Care Note  Patient: Morgan Newton  Procedure(s) Performed: POST PARTUM TUBAL LIGATION  Patient Location: PACU  Anesthesia Type:Spinal  Level of Consciousness: awake, alert  and oriented  Airway & Oxygen Therapy: Patient Spontanous Breathing  Post-op Assessment: Report given to RN and Post -op Vital signs reviewed and stable  Post vital signs: Reviewed and stable  Last Vitals:  Vitals Value Taken Time  BP 134/76 08/23/21 1247  Temp    Pulse 98 08/23/21 1249  Resp 23 08/23/21 1249  SpO2 100 % 08/23/21 1249  Vitals shown include unvalidated device data.  Last Pain:  Vitals:   08/23/21 0755  TempSrc: Oral  PainSc:       Patients Stated Pain Goal: 0 (94/32/76 1470)  Complications: No notable events documented.

## 2021-08-23 NOTE — Lactation Note (Signed)
This note was copied from a baby's chart. Lactation Consultation Note  Patient Name: Morgan Newton HRCBU'L Date: 08/23/2021 Reason for consult: Initial assessment;Early term 37-38.6wks Age:35 hours   P3 mother whose infant is now 65 hours old.  This is an ETI at 38+0 weeks.  Mother breast fed her second child (now 11 years old) for 7 weeks.  Her current feeding preference is breast.  Baby "Morgan Newton" was swaddled and asleep in mother's arms when I arrived.  Mother reported a good feeding after delivery.  Reviewed breast feeding basics and encouraged lots of STS.  Mother able to observe colostrum and will finger feed/spoon feed any colostrum she obtains to "Morgan Newton."  Suggested mother call her RN/LC for latch assistance as needed.  Mother is currently NPO: tubal at noon today.  Father present.     Maternal Data Has patient been taught Hand Expression?: Yes Does the patient have breastfeeding experience prior to this delivery?: Yes How long did the patient breastfeed?: 7 weeks with her second child  Feeding Mother's Current Feeding Choice: Breast Milk  LATCH Score Latch: Grasps breast easily, tongue down, lips flanged, rhythmical sucking.  Audible Swallowing: A few with stimulation  Type of Nipple: Everted at rest and after stimulation  Comfort (Breast/Nipple): Soft / non-tender  Hold (Positioning): Full assist, staff holds infant at breast  LATCH Score: 7   Lactation Tools Discussed/Used    Interventions Interventions: Breast feeding basics reviewed;Education  Discharge Pump: Personal  Consult Status Consult Status: Follow-up Date: 08/24/21 Follow-up type: In-patient    Orla Estrin R Zahari Fazzino 08/23/2021, 3:30 AM

## 2021-08-23 NOTE — Anesthesia Postprocedure Evaluation (Signed)
Anesthesia Post Note  Patient: ALYSA DUCA  Procedure(s) Performed: AN AD HOC LABOR EPIDURAL     Patient location during evaluation: Mother Baby Anesthesia Type: Epidural Level of consciousness: awake and alert Pain management: pain level controlled Vital Signs Assessment: post-procedure vital signs reviewed and stable Respiratory status: spontaneous breathing, nonlabored ventilation and respiratory function stable Cardiovascular status: stable Postop Assessment: no headache, no backache, epidural receding, no apparent nausea or vomiting, patient able to bend at knees, adequate PO intake and able to ambulate Anesthetic complications: no   No notable events documented.  Last Vitals:  Vitals:   08/23/21 0355 08/23/21 0755  BP: 132/81 125/86  Pulse: (!) 106 86  Resp: 18 16  Temp: 37.3 C 37.1 C  SpO2: 100% 98%    Last Pain:  Vitals:   08/23/21 0755  TempSrc: Oral  PainSc:    Pain Goal: Patients Stated Pain Goal: 0 (08/22/21 1820)                 Jabier Mutton

## 2021-08-23 NOTE — Progress Notes (Signed)
CSW acknowledged consult and attempted to meet with MOB; however, MOB was out of the room for a procedure. CSW will meet with MOB tomorrow.   Abundio Miu, [place] Worker [facility] Cell#: [phone]

## 2021-08-23 NOTE — Op Note (Signed)
Morgan Newton 08/23/2021  PREOPERATIVE DIAGNOSES: Multiparity, undesired fertility  POSTOPERATIVE DIAGNOSES: Multiparity, undesired fertility  PROCEDURE:  Postpartum Bilateral Partial Salpingectomy  SURGEON: Dr. Radene Gunning  ANESTHESIA:  Epidural and local analgesia using 0.5% Bupivicaine  COMPLICATIONS:  None immediate.  ESTIMATED BLOOD LOSS: 25 ml.  INDICATIONS:  36 y.o. [id number] with undesired fertility, status post vaginal delivery, desires permanent sterilization.  Other reversible forms of contraception were discussed with patient; she declines all other modalities. Risks of procedure discussed with patient including but not limited to: risk of regret, permanence of method, bleeding, infection, injury to surrounding organs and need for additional procedures.  Failure risk of 1% with increased risk of ectopic gestation if pregnancy occurs was also discussed with patient.      FINDINGS:  Normal uterus, tubes, and ovaries.  PROCEDURE DETAILS:  The patient was taken to the operating room where her epidural anesthesia was dosed up to surgical level and found to be adequate.  She was then placed in the dorsal supine position and prepped and draped in sterile fashion.  After an adequate timeout was performed, attention was turned to the patient's abdomen where a small transverse skin incision was made under the umbilical fold. The incision was taken down to the layer of fascia using the scalpel, and fascia was incised, and extended bilaterally using Mayo scissors. The peritoneum was entered in a sharp fashion. Attention was then turned to the patient's uterus, the right fallopian tube was grasped with Babcock clamps and followed out to the fimbriated end. The distal 5 cm portion of the tube and mesosalpinx was doubly clamped using Kelly clamps and the distal portion of the tube removed using Metzenbaum scissors. The remaining pedicle was tied off using a 2-0 plain gut free tie and a subsequent  suture ligation with 2-0 plain gut. The pedicle was inspected and found to be hemostatic.  A similar process was carried out on the left side allowing for bilateral tubal sterilization.  Good hemostasis was noted overall. The instruments were then removed from the patient's abdomen and the fascial incision was repaired with 0 Vicryl, and the skin was closed with a 4-0 Vicryl subcuticular stitch. Incision injection with 0.5% bupivacaine for a total of 10 cc. Skin covered with dermabond.  The patient tolerated the procedure well.  Instrument, sponge, and needle counts were correct times two.  The patient was then taken to the recovery room awake and in stable condition.   Radene Gunning, MD, [place] Attending [place], [facility]

## 2021-08-23 NOTE — Anesthesia Postprocedure Evaluation (Signed)
Anesthesia Post Note  Patient: JAREN KEARN  Procedure(s) Performed: POST PARTUM TUBAL LIGATION     Patient location during evaluation: PACU Anesthesia Type: Epidural Level of consciousness: oriented and awake and alert Pain management: pain level controlled Vital Signs Assessment: post-procedure vital signs reviewed and stable Respiratory status: spontaneous breathing, respiratory function stable and patient connected to nasal cannula oxygen Cardiovascular status: blood pressure returned to baseline and stable Postop Assessment: no headache, no backache, no apparent nausea or vomiting and epidural receding Anesthetic complications: no   No notable events documented.  Last Vitals:  Vitals:   08/23/21 1441 08/23/21 1534  BP: 137/88 132/79  Pulse: 83 80  Resp: 16 18  Temp: 36.8 C 36.9 C  SpO2: 100% 100%    Last Pain:  Vitals:   08/23/21 1534  TempSrc: Oral  PainSc: 4    Pain Goal: Patients Stated Pain Goal: 0 (08/22/21 1820)                 Jackelynn Hosie L Maxson Oddo

## 2021-08-23 NOTE — Anesthesia Preprocedure Evaluation (Signed)
Anesthesia Evaluation    Reviewed: Allergy & Precautions, Patient's Chart, lab work & pertinent test results  Airway Mallampati: III  TM Distance: >3 FB Neck ROM: Full    Dental no notable dental hx.    Pulmonary asthma , Current Smoker,    Pulmonary exam normal breath sounds clear to auscultation       Cardiovascular hypertension (cHTN with superimposed preE), Normal cardiovascular exam Rhythm:Regular Rate:Normal     Neuro/Psych PSYCHIATRIC DISORDERS Depression negative neurological ROS     GI/Hepatic negative GI ROS, Neg liver ROS,   Endo/Other  diabetes, GestationalMorbid obesity (BMI 40)  Renal/GU negative Renal ROS  negative genitourinary   Musculoskeletal negative musculoskeletal ROS (+)   Abdominal   Peds  Hematology negative hematology ROS (+)   Anesthesia Other Findings PPTL  Reproductive/Obstetrics                             Anesthesia Physical Anesthesia Plan  ASA: 3  Anesthesia Plan: Epidural   Post-op Pain Management:    Induction:   PONV Risk Score and Plan: 2 and Treatment may vary due to age or medical condition, Midazolam, Ondansetron and Dexamethasone  Airway Management Planned: Natural Airway  Additional Equipment:   Intra-op Plan:   Post-operative Plan:   Informed Consent: I have reviewed the patients History and Physical, chart, labs and discussed the procedure including the risks, benefits and alternatives for the proposed anesthesia with the patient or authorized representative who has indicated his/her understanding and acceptance.       Plan Discussed with: Anesthesiologist  Anesthesia Plan Comments: ( )        Anesthesia Quick Evaluation

## 2021-08-23 NOTE — Discharge Summary (Addendum)
Postpartum Discharge Summary      Patient Name: Morgan Newton DOB: 06/12/1986 MRN: [id number]  Date of admission: 08/22/2021 Delivery date:08/22/2021  Delivering provider: Renard Matter  Date of discharge: 08/24/2021  Admitting diagnosis: Chronic hypertension affecting pregnancy [O10.919] Intrauterine pregnancy: [redacted]w[redacted]d    Secondary diagnosis:  Principal Problem:   Chronic hypertension affecting pregnancy Active Problems:   Supervision of high risk pregnancy, antepartum   History of IUFD   History of cesarean delivery   Chronic hypertension   GDM (gestational diabetes mellitus)   History of bilateral salpingectomy  Additional problems: None    Discharge diagnosis: Term Pregnancy Delivered, VBAC, Preeclampsia (mild), and GDM A2                                              Post partum procedures:postpartum tubal ligation Augmentation: Pitocin and IP Foley Complications: None  Hospital course: Induction of Labor With Vaginal Delivery   36y.o. yo [id number] 35w1das admitted to the hospital 08/22/2021 for induction of labor.  Indication for induction: Preeclampsia and A2 DM.  Patient had an uncomplicated labor course as follows: Membrane Rupture Time/Date: 5:34 AM ,08/22/2021   Delivery Method:Vaginal, Spontaneous  Episiotomy: None  Lacerations:  Periurethral  Details of delivery can be found in separate delivery note.  Patient had a routine postpartum course. She underwent postpartum bilateral partial salpingectomy. She was continued on her pre-delivery Procardia dose of 3073mID and was started on Lasix 65m24mily for 5 days. Patient is discharged home 08/24/21.  Newborn Data: Birth date:08/22/2021  Birth time:11:37 PM  Gender:Female  Living status:Living  Apgars:8 ,9  Weight:2761 g   Magnesium Sulfate received: No BMZ received: No Rhophylac:N/A MMR:N/A T-DaP:Given prenatally Flu: No Transfusion:No  Physical exam  Vitals:   08/23/21 1534 08/23/21 1950 08/23/21 2356  08/24/21 0343  BP: 132/79 (!) 156/79 139/87 129/69  Pulse: 80 79 86 74  Resp: _0 Temp: 98.5 F (36.9 C) 99 F (37.2 C) 98.5 F (36.9 C) 98.5 F (36.9 C)  TempSrc: Oral Oral Oral Oral  SpO2: 100% 100% 97% 100%   General: alert Lochia: appropriate Uterine Fundus: firm Incision: Honeycomb Dressing is clean, dry, and intact DVT Evaluation: No evidence of DVT seen on physical exam. Labs: Lab Results  Component Value Date   WBC 6.4 08/22/2021   HGB 12.9 08/22/2021   HCT 37.4 08/22/2021   MCV 90.3 08/22/2021   PLT 314 08/22/2021   CMP Latest Ref Rng & Units 08/22/2021  Glucose 70 - 99 mg/dL 75  BUN 6 - 20 mg/dL 7  Creatinine 0.44 - 1.00 mg/dL 0.66  Sodium 135 - 145 mmol/L 134(L)  Potassium 3.5 - 5.1 mmol/L 3.5  Chloride 98 - 111 mmol/L 104  CO2 22 - 32 mmol/L 19(L)  Calcium 8.9 - 10.3 mg/dL 9.2  Total Protein 6.5 - 8.1 g/dL 5.9(L)  Total Bilirubin 0.3 - 1.2 mg/dL 0.5  Alkaline Phos 38 - 126 U/L 180(H)  AST 15 - 41 U/L 29  ALT 0 - 44 U/L 23   Edinburgh Score: Edinburgh Postnatal Depression Scale Screening Tool 08/23/2021  I have been able to laugh and see the funny side of things. 0  I have looked forward with enjoyment to things. 0  I have blamed myself unnecessarily when things went wrong. 1  I have  been anxious or worried for no good reason. 2  I have felt scared or panicky for no good reason. 1  Things have been getting on top of me. 1  I have been so unhappy that I have had difficulty sleeping. 0  I have felt sad or miserable. 1  I have been so unhappy that I have been crying. 0  The thought of harming myself has occurred to me. 0  Edinburgh Postnatal Depression Scale Total 6     After visit meds:  Allergies as of 08/24/2021       Reactions   Penicillins Shortness Of Breath, Palpitations   .Marland KitchenHas patient had a PCN reaction causing immediate rash, facial/tongue/throat swelling, SOB or lightheadedness with hypotension: NO Has patient had a PCN reaction  causing severe rash involving mucus membranes or skin necrosis: NO Has patient had a PCN reaction that required hospitalization No Has patient had a PCN reaction occurring within the last 10 years: No If all of the above answers are "NO", then may proceed with Cephalosporin use.   Caffeine Other (See Comments)   Heart murmur        Medication List     STOP taking these medications    aspirin EC 81 MG tablet   famotidine 40 MG tablet Commonly known as: Pepcid   metFORMIN 500 MG tablet Commonly known as: Glucophage   polyethylene glycol powder 17 GM/SCOOP powder Commonly known as: GLYCOLAX/MIRALAX       TAKE these medications    acetaminophen 325 MG tablet Commonly known as: Tylenol Take 2 tablets (650 mg total) by mouth every 4 (four) hours as needed (for pain scale < 4).   Blood Pressure Monitoring Devi 1 each by Does not apply route once a week.   furosemide 20 MG tablet Commonly known as: LASIX Take 1 tablet (20 mg total) by mouth daily for 4 days.   ibuprofen 600 MG tablet Commonly known as: ADVIL Take 1 tablet (600 mg total) by mouth every 6 (six) hours.   NIFEdipine 30 MG 24 hr tablet Commonly known as: PROCARDIA-XL/NIFEDICAL-XL Take 1 tablet (30 mg total) by mouth in the morning and at bedtime.   oxyCODONE 5 MG immediate release tablet Commonly known as: Oxy IR/ROXICODONE Take 1 tablet (5 mg total) by mouth every 6 (six) hours as needed for severe pain.   PrePLUS 27-1 MG Tabs Take 1 tablet by mouth daily.         Discharge home in stable condition Infant Feeding: Breast Infant Disposition:home with mother Discharge instruction: per After Visit Summary and Postpartum booklet. Activity: Advance as tolerated. Pelvic rest for 6 weeks.  Diet: routine diet Future Appointments: Future Appointments  Date Time Provider [place]  09/01/2021  9:15 AM [place] [facility]  09/02/2021  9:00 AM [facility] NURSE [facility]   09/02/2021  9:30 AM [facility] LAB [facility]  09/24/2021 10:35 AM Chancy Milroy, MD [facility]   Follow up Visit: Message sent to [facility] by Dr. Cy Blamer on 2/5 Please schedule this patient for a In person postpartum visit in 4 weeks with the following provider: MD. Additional Postpartum F/U:2 hour GTT and BP check 1 week  High risk pregnancy complicated by: GDM and preE without SF Delivery mode:  Vaginal, Spontaneous  Anticipated Birth Control:  BTL done PP   Renard Matter, MD, MPH OB Fellow, Faculty Practice    Attestation of Attending Supervision of Obstetric Fellow: Evaluation and management procedures were performed by the  Obstetric Fellow under my supervision and collaboration.  I have reviewed the Obstetric Fellow's note and chart, and I agree with the management and plan. I have also made any necessary editorial changes.   Clarnce Flock, MD Family Medicine Attending, [facility] for [facility], [facility] 08/24/2021 10:46 AM

## 2021-08-23 NOTE — Progress Notes (Signed)
-   Consent process done for permanent sterilization. She is 100% sure she is done having any additional children. She would like to have permanent sterilization.  - Discussed surgery of salpingectomy vs tubal ligation. She would like to do a salpingectomy.  - Discussed risks: bleeding, infection, injury to surrounding organs/tissues, possible need for open surgery - We reviewed risks are no different between tubal ligation and salpingectomy. Reviewed efficacy of each one and also discussed possible risk reduction of salpingectomy for ovarian cancer.  - All questions answered. Consent was already signed but added an addendum to specify "salpingectomy" on her consent - I initialed this part and she signed adjacent to it as well.   Radene Gunning, MD Attending [place], [facility] for [facility], [place]

## 2021-08-23 NOTE — Lactation Note (Signed)
This note was copied from a baby's chart. Lactation Consultation Note  Patient Name: Morgan Newton KGYBN'L Date: 08/23/2021 Reason for consult: L&D Initial assessment Age:35 hours P3, ETI female infant. Per mom, she did not BF 1st child and 2nd child she breastfeed for 7 weeks. LC entered room, infant was cuing to breastfeed. Mom latched infant on her right breast using the football hold position, infant sustained latch and breastfeed for 15 minutes. Mom knows to continue to breastfeed infant according to primal cues, 8 to 12+ or more times within 24 hours, skin to skin. Mom knows to call RN/LC for further latch assistance if needed. [place] congratulated parents on the birth of their son.  Maternal Data    Feeding Mother's Current Feeding Choice: Breast Milk  LATCH Score Latch: Grasps breast easily, tongue down, lips flanged, rhythmical sucking.  Audible Swallowing: A few with stimulation  Type of Nipple: Everted at rest and after stimulation  Comfort (Breast/Nipple): Soft / non-tender  Hold (Positioning): Full assist, staff holds infant at breast  LATCH Score: 7   Lactation Tools Discussed/Used    Interventions Interventions: Assisted with latch;Skin to skin;Breast compression;Adjust position;Support pillows;Position options;Education  Discharge    Consult Status Consult Status: Follow-up from L&D    Vicente Serene 08/23/2021, 1:17 AM

## 2021-08-24 ENCOUNTER — Other Ambulatory Visit (HOSPITAL_COMMUNITY): Payer: Self-pay

## 2021-08-24 DIAGNOSIS — Z9079 Acquired absence of other genital organ(s): Secondary | ICD-10-CM

## 2021-08-24 LAB — GLUCOSE, CAPILLARY: Glucose-Capillary: 70 mg/dL (ref 70–99)

## 2021-08-24 MED ORDER — NIFEDIPINE ER OSMOTIC RELEASE 30 MG PO TB24
30.0000 mg | ORAL_TABLET | Freq: Two times a day (BID) | ORAL | Status: DC
  Administered 2021-08-24: 30 mg via ORAL
  Filled 2021-08-24: qty 1

## 2021-08-24 MED ORDER — ACETAMINOPHEN 325 MG PO TABS
650.0000 mg | ORAL_TABLET | ORAL | 0 refills | Status: DC | PRN
  Filled 2021-08-24: qty 60, 5d supply, fill #0

## 2021-08-24 MED ORDER — FUROSEMIDE 20 MG PO TABS
20.0000 mg | ORAL_TABLET | Freq: Every day | ORAL | Status: DC
  Administered 2021-08-24: 20 mg via ORAL
  Filled 2021-08-24: qty 1

## 2021-08-24 MED ORDER — IBUPROFEN 600 MG PO TABS
600.0000 mg | ORAL_TABLET | Freq: Four times a day (QID) | ORAL | 0 refills | Status: DC
  Filled 2021-08-24: qty 30, 8d supply, fill #0

## 2021-08-24 MED ORDER — FUROSEMIDE 20 MG PO TABS
20.0000 mg | ORAL_TABLET | Freq: Every day | ORAL | 0 refills | Status: DC
  Filled 2021-08-24: qty 4, 4d supply, fill #0

## 2021-08-24 MED ORDER — NIFEDIPINE ER 30 MG PO TB24
30.0000 mg | ORAL_TABLET | Freq: Two times a day (BID) | ORAL | 0 refills | Status: DC
  Filled 2021-08-24: qty 60, 30d supply, fill #0

## 2021-08-24 MED ORDER — OXYCODONE HCL 5 MG PO TABS
5.0000 mg | ORAL_TABLET | Freq: Four times a day (QID) | ORAL | 0 refills | Status: DC | PRN
  Filled 2021-08-24: qty 5, 2d supply, fill #0

## 2021-08-24 NOTE — Lactation Note (Signed)
This note was copied from a baby's chart. Lactation Consultation Note  Patient Name: Boy Princessa Lesmeister [id number] Date: 08/24/2021 Reason for consult: Follow-up assessment;Early term 37-38.6wks Age:35 hours   LC Follow Up Consult:  RN requested a lactation visit; mother with concerns.  Arrived to find "Lennette Bihari" asleep on mother's chest.  She reported a good feeding recently.  Mother is concerned that she doesn't "have enough" for him due to baby desiring to eat so frequently.  Discussed cluster feeding, tummy size, feeding cues and encouraged her to continue feeding on cue.  Reassurance given that "Lennette Bihari" is feeding progression is good.  He has had multiple voids/stools and is content after feeding.  Mother also complaining of pain due to breast feeding and pain from her tubal yesterday.  Encouraged to continue taking pain medication as needed and to call her RN before pain gets too severe.  Mother also aware than cramping is a sign of a good latch.  Offered to return as needed for latch assistance.  Encouraged mother to walk in the hallway today.  RN in room to administer pain medication.   Maternal Data    Feeding Mother's Current Feeding Choice: Breast Milk  LATCH Score Latch: Repeated attempts needed to sustain latch, nipple held in mouth throughout feeding, stimulation needed to elicit sucking reflex.  Audible Swallowing: Spontaneous and intermittent  Type of Nipple: Everted at rest and after stimulation  Comfort (Breast/Nipple): Soft / non-tender  Hold (Positioning): Assistance needed to correctly position infant at breast and maintain latch.  LATCH Score: 8   Lactation Tools Discussed/Used    Interventions Interventions: Breast feeding basics reviewed;Education  Discharge Discharge Education: Engorgement and breast care Pump: Personal  Consult Status Consult Status: Complete Date: 08/24/21 Follow-up type: Call as needed    Nashali Ditmer R Gurkaran Rahm 08/24/2021, 4:45 AM

## 2021-08-24 NOTE — Social Work (Signed)
CSW received consult for hx of Anxiety, Depression and Edinburgh 6.  CSW met with Morgan Newton to offer support and complete assessment.   ° °CSW met with Morgan Newton at bedside and introduced CSW role. CSW observed Morgan Newton sitting in the recliner and FOB present and engaged with video game console. CSW explained the reason for the visit and offered Morgan Newton privacy. Morgan Newton identified her spouse as a support and welcomed him to stay. FOB reported that there were no individuals on the headset at time of the assessment. CSW inquired how Morgan Newton has felt since giving birth. Morgan Newton reported feeling good and adjusting to breastfeeding. Morgan Newton shared the L&D was “rough” however, she reflected on the positives that she and baby are both healthy and doing well. CSW inquired how Morgan Newton felt emotionally during the pregnancy. Morgan Newton reported that her emotions were "up and down".  Morgan Newton reported she has been anxious about work and the possible transition to another job since her current position does not pay for maternity leave. Morgan Newton reported that her spouse assured her “that things would work out.” Morgan Newton reported that she has a history of anxiety and depression and was diagnosed in 2012. Morgan Newton reported that she currently sees a Virtual therapist through the "[employer] Morgan Newton shared, "they helped me get my mind back, and helped me calm down about stuff." Morgan Newton acknowledges her faith and talking with her spouse as coping strategies. CSW praised Morgan Newton for her efforts and encouraged her to continue using them. CSW inquired about PPD history. Morgan Newton reported that she had baby blues after the birth her older child in 2014 that lasted for about six months. Morgan Newton reported her faith and church family helped her get through it. Morgan Newton was receptive to PPD resources. CSW provided education regarding the baby blues period vs. perinatal mood disorders, discussed treatment and gave resources for mental health follow up if concerns arise. CSW recommended Morgan Newton self-evaluation during the  postpartum time period using the New Mom Checklist from Postpartum Progress and encouraged Morgan Newton to contact a medical professional if symptoms are noted at any time. CSW assessed Morgan Newton for safety. Morgan Newton denied thoughts of harm to self and others.  ° °Morgan Newton reported that she has all essential items for the infant including a bassinet where the infant will sleep.CSW provided review of Sudden Infant Death Syndrome (SIDS) precautions. Morgan Newton reported understanding Morgan Newton reported that she and FOB are still deciding on a pediatrician and will have to the appointments. CSW offered Morgan Newton resources and informed Morgan Newton about [facility]. Morgan Newton gave CSW permission to make a referral. CSW assessed Morgan Newton for additional needs. Morgan Newton reported no further need.  ° °CSW identifies no further need for intervention and no barriers to discharge at this time.  ° °Merrissa Giacobbe, MSW, LCSW °[facility]  °Clinical Social Worker  °[phone] °08/24/2021  10:38 AM °

## 2021-08-25 ENCOUNTER — Telehealth: Payer: Self-pay | Admitting: *Deleted

## 2021-08-25 LAB — SURGICAL PATHOLOGY

## 2021-08-25 NOTE — Telephone Encounter (Signed)
Transition Care Management Unsuccessful Follow-up Telephone Call  Date of discharge and from where:  08/24/2021 - [facility]  Attempts:  1st Attempt  Reason for unsuccessful TCM follow-up call:  Left voice message

## 2021-08-26 NOTE — Telephone Encounter (Signed)
Transition Care Management Unsuccessful Follow-up Telephone Call  Date of discharge and from where:  08/24/2021 - [place]  Attempts:  2nd Attempt  Reason for unsuccessful TCM follow-up call:  Left voice message

## 2021-08-27 NOTE — Telephone Encounter (Signed)
Transition Care Management Unsuccessful Follow-up Telephone Call  Date of discharge and from where:  08/24/2021 from [facility]  Attempts:  3rd Attempt  Reason for unsuccessful TCM follow-up call:  Unable to reach patient   .

## 2021-09-01 ENCOUNTER — Ambulatory Visit (INDEPENDENT_AMBULATORY_CARE_PROVIDER_SITE_OTHER): Payer: Medicaid Other | Admitting: Clinical

## 2021-09-01 DIAGNOSIS — Z7189 Other specified counseling: Secondary | ICD-10-CM | POA: Diagnosis not present

## 2021-09-01 NOTE — Patient Instructions (Signed)
[facility] [place], [place] [address] [phone] (main office) [phone] [facility] office)  New mom/dad support groups: www.conehealthybaby.com  www.postpartum.net   /Emotional [employer] and Websites Here are a few free apps meant to help you to help yourself.  To find, try searching on the internet to see if the app is offered on Apple/Android devices. If your first choice doesn't come up on your device, the good news is that there are many choices! Play around with different apps to see which ones are helpful to you.    Calm This is an app meant to help increase calm feelings. Includes info, strategies, and tools for tracking your feelings.      Calm Harm  This app is meant to help with self-harm. Provides many 5-minute or 15-min coping strategies for doing instead of hurting yourself.       [place] is a problem-solving tool to help deal with emotions and cope with stress you encounter wherever you are.      MindShift This app can help people cope with anxiety. Rather than trying to avoid anxiety, you can make an important shift and face it.      MY3  MY3 features a support system, safety plan and resources with the goal of offering a tool to use in a time of need.       My Life My Voice  This mood journal offers a simple solution for tracking your thoughts, feelings and moods. Animated emoticons can help identify your mood.       Relax Melodies Designed to help with sleep, on this app you can mix sounds and meditations for relaxation.      Smiling Mind Smiling Mind is meditation made easy: it's a simple tool that helps put a smile on your mind.        Stop, Breathe & Think  A friendly, simple guide for people through meditations for mindfulness and compassion.  Stop, Breathe and Think Kids Enter your current feelings and choose a mission to help you cope.  Offers videos for certain moods instead of just sound recordings.       Team Orange The goal of this tool is to help teens change how they think, act, and react. This app helps you focus on your own good feelings and experiences.      The [place] Box The [place] Box (VHB) contains simple tools to help patients with coping, relaxation, distraction, and positive thinking.

## 2021-09-02 ENCOUNTER — Other Ambulatory Visit: Payer: Self-pay

## 2021-09-02 ENCOUNTER — Telehealth (HOSPITAL_COMMUNITY): Payer: Self-pay | Admitting: *Deleted

## 2021-09-02 ENCOUNTER — Telehealth: Payer: Self-pay | Admitting: Family Medicine

## 2021-09-02 ENCOUNTER — Ambulatory Visit (INDEPENDENT_AMBULATORY_CARE_PROVIDER_SITE_OTHER): Payer: Medicaid Other

## 2021-09-02 ENCOUNTER — Other Ambulatory Visit: Payer: Medicaid Other

## 2021-09-02 VITALS — BP 136/84 | HR 60 | Wt 181.4 lb

## 2021-09-02 DIAGNOSIS — O24410 Gestational diabetes mellitus in pregnancy, diet controlled: Secondary | ICD-10-CM

## 2021-09-02 DIAGNOSIS — R309 Painful micturition, unspecified: Secondary | ICD-10-CM

## 2021-09-02 DIAGNOSIS — R350 Frequency of micturition: Secondary | ICD-10-CM

## 2021-09-02 LAB — POCT URINALYSIS DIP (DEVICE)
Bilirubin Urine: NEGATIVE
Glucose, UA: NEGATIVE mg/dL
Ketones, ur: NEGATIVE mg/dL
Nitrite: NEGATIVE
Protein, ur: NEGATIVE mg/dL
Specific Gravity, Urine: 1.015 (ref 1.005–1.030)
Urobilinogen, UA: 0.2 mg/dL (ref 0.0–1.0)
pH: 6 (ref 5.0–8.0)

## 2021-09-02 NOTE — Telephone Encounter (Signed)
Called pt and pt states that she delivered on 08/23/2021 instead of the date that was originally placed on her FMLA paperwork.  I informed pt that she will be able to obtain her letter via [employer]. Pt verbalized understanding.   Frances Nickels  09/02/21

## 2021-09-02 NOTE — Progress Notes (Signed)
Blood Pressure Check Visit  DEVORAH GIVHAN is here for blood pressure check following vaginal delivery on 08/23/21. BP today is 140/100. BP recheck approximately 10 minutes later was 136/84. Patient denies any dizziness, blurred vision, headache, shortness of breath, peripheral edema. Patient takes Nifedipine 30 mg twice daily but states she has not taken her morning dose yet due to having to fast for her 2 hour blood glucose test this morning. Reviewed with Dr. Dione Plover. Per Dr. Dione Plover patient to continue taking her Nifedipine 30 mg BID and follow up with [place] at her postpartum appointment. I reviewed patient's postpartum appointment date and time with her. Patient has complaint of painful urination and urinary frequency. Urine culture sent. I informed patient we will notify her with any abnormal results.   Seth Bake, RN 09/02/2021

## 2021-09-02 NOTE — Telephone Encounter (Signed)
Opened chart for Hospital Discharge Follow-Up Call.  Saw that patient has already been seen by [facility] and RN in last two days.  Will defer call.

## 2021-09-02 NOTE — Telephone Encounter (Signed)
Patient state that her employer is asking for a letter of the reason she had to be induced, the day she got induced and when can she return back to work.

## 2021-09-03 LAB — GLUCOSE TOLERANCE, 2 HOURS
Glucose, 2 hour: 145 mg/dL — ABNORMAL HIGH (ref 70–139)
Glucose, GTT - Fasting: 88 mg/dL (ref 70–99)

## 2021-09-17 ENCOUNTER — Telehealth: Payer: Self-pay | Admitting: Family Medicine

## 2021-09-17 NOTE — Telephone Encounter (Signed)
Called number listed under mobile for patient, there was no answer tot he phone call so a voicemail was left with the call back number for the office.  ?

## 2021-09-24 ENCOUNTER — Ambulatory Visit: Payer: Medicaid Other | Admitting: Obstetrics and Gynecology

## 2021-09-24 ENCOUNTER — Other Ambulatory Visit: Payer: Self-pay

## 2021-09-24 ENCOUNTER — Ambulatory Visit (INDEPENDENT_AMBULATORY_CARE_PROVIDER_SITE_OTHER): Payer: Medicaid Other | Admitting: Student

## 2021-09-24 VITALS — BP 131/84 | HR 57 | Ht 59.0 in | Wt 181.0 lb

## 2021-09-24 DIAGNOSIS — O24410 Gestational diabetes mellitus in pregnancy, diet controlled: Secondary | ICD-10-CM | POA: Diagnosis not present

## 2021-09-24 NOTE — Progress Notes (Signed)
? ? ?Post Partum Visit Note ? ?Morgan Newton is a 36 y.o. [phone] female who presents for a postpartum visit. She is 4 weeks postpartum following a normal spontaneous vaginal delivery and tubal ligation was performed.  I have fully reviewed the prenatal and intrapartum course. The delivery was at 39.1 gestational weeks.  Anesthesia: epidural. Postpartum course has been uneventful. Baby is doing well. Baby is feeding by both breast and bottle - Enfamil with Iron. Bleeding no bleeding. Bowel function is normal. Bladder function is normal. Patient is sexually active. Contraception method is tubal ligation. Postpartum depression screening: positive. Offered for patient to see Roselyn Reef but she declines at this time- she is aware of how to make an appt. ? ? ?The pregnancy intention screening data noted above was reviewed. Potential methods of contraception were discussed. The patient elected to proceed with No data recorded. ? ? Edinburgh Postnatal Depression Scale - 09/24/21 1547   ? ?  ? Edinburgh Postnatal Depression Scale:  In the Past 7 Days  ? I have been able to laugh and see the funny side of things. 0   ? I have looked forward with enjoyment to things. 1   ? I have blamed myself unnecessarily when things went wrong. 2   ? I have been anxious or worried for no good reason. 2   ? I have felt scared or panicky for no good reason. 2   ? Things have been getting on top of me. 2   ? I have been so unhappy that I have had difficulty sleeping. 0   ? I have felt sad or miserable. 2   ? I have been so unhappy that I have been crying. 0   ? The thought of harming myself has occurred to me. 0   ? Edinburgh Postnatal Depression Scale Total 11   ? ?  ?  ? ?  ? ? ?Health Maintenance Due  ?Topic Date Due  ? COVID-19 Vaccine (1) Never done  ? URINE MICROALBUMIN  Never done  ? ? ?The following portions of the patient's history were reviewed and updated as appropriate: allergies, current medications, past family history, past  medical history, past social history, past surgical history, and problem list. ? ?Review of Systems ?Pertinent items are noted in HPI. ? ?Objective:  ?BP 131/84   Pulse (!) 57   Ht 4' 11"  (1.499 m)   Wt 181 lb (82.1 kg)   LMP 08/11/2020 (Exact Date)   Breastfeeding Yes   BMI 36.56 kg/m?   ? ?General:  alert, cooperative, and no distress  ? Breasts:  Not examined  ?Lungs: clear to auscultation bilaterally  ?Heart:  regular rate and rhythm, S1, S2 normal, no murmur, click, rub or gallop  ?Abdomen: Not examined    ?Wound NA  ?GU exam:   Not examined  ?     ?Assessment:  ? ? ?Healthy  postpartum exam.  ? ?Plan:  ? ?Essential components of care per ACOG recommendations: ? ?1.  Mood and well being: Patient with positive depression screening today. Reviewed local resources for support.  ?- Patient tobacco use? Yes. Patient desires to quit? Yes.Discussed reduction and cessation  ?- hx of drug use? No.   ? ?2. Infant care and feeding:  ?-Patient currently breastmilk feeding?  ?-Social determinants of health (SDOH) reviewed in EPIC. No concerns ? ?3. Sexuality, contraception and birth spacing ?- Patient does not want a pregnancy in the next year.  Desired family size is  3 children.  ?- Reviewed reproductive life planning. Reviewed contraceptive methods based on pt preferences and effectiveness.  Patient desired bilateral tubal ligation, whichy was done at the hospital before discharge.    ?- Discussed birth spacing of 18 months ? ?4. Sleep and fatigue ?-Encouraged family/partner/community support of 4 hrs of uninterrupted sleep to help with mood and fatigue ? ?5. Physical Recovery  ?- Discussed patients delivery and complications. She describes her labor as good. ?- Patient had a Vaginal, no problems at delivery. Patient had a 2nd degree laceration. Perineal healing reviewed. Patient expressed understanding ?- Patient has urinary incontinence? No. ?- Patient is safe to resume physical and sexual activity ? ?6.  Health  Maintenance ?- HM due items addressed Yes ?- Last pap smear  ?Diagnosis  ?Date Value Ref Range Status  ?02/04/2021   Final  ? - Negative for intraepithelial lesion or malignancy (NILM)  ? Pap smear not done at today's visit. She declines any other testing.  ?-Breast Cancer screening indicated? No.  ? ?7. Chronic Disease/Pregnancy Condition follow up:  failed postpartum GTT; patient believes that she had  been drinking too much soda in the days leading up to there 2 hour GTT and she would like her HgA1c drawn. Will draw and let her know the results.   ?-She should also follow up with her PCP for HTN management.  ?- PCP follow up ? ?Starr Lake, CNM ?[facility]  ?

## 2021-09-25 ENCOUNTER — Encounter: Payer: Self-pay | Admitting: Student

## 2021-09-25 LAB — HEMOGLOBIN A1C
Est. average glucose Bld gHb Est-mCnc: 120 mg/dL
Hgb A1c MFr Bld: 5.8 % — ABNORMAL HIGH (ref 4.8–5.6)

## 2021-10-05 ENCOUNTER — Encounter: Payer: Self-pay | Admitting: Student

## 2021-10-06 ENCOUNTER — Other Ambulatory Visit: Payer: Self-pay | Admitting: Lactation Services

## 2021-10-06 MED ORDER — METOCLOPRAMIDE HCL 10 MG PO TABS: 10.0000 mg | ORAL_TABLET | Freq: Three times a day (TID) | ORAL | 0 refills | Status: DC

## 2021-10-06 NOTE — Progress Notes (Signed)
Reglan ordered at mom's request for low milk supply with approval from Dr. Ernestina Patches. Mom informed via Mychart.  ?

## 2021-12-28 NOTE — Progress Notes (Deleted)
Patient ID: Morgan Newton, female    DOB: [birth year]  MRN: [id number]  CC: No chief complaint on file.   Subjective: Morgan Newton is a 36 y.o. female who presents for  Her concerns today include: ***  My hands have started to dehydrate, peel, crack and weep. I've been using hydrocortisone, A&D cream, aquafor, Vaseline, I even tried an excema cream T.A 0.025%. Nothing is working. I also am having an issue with my right wrist and thumb. Feels like it hyper extends or pops out of place doing simple daily task. This has been occuring since the recent pregnancy. Baby was born 08/20/21. The symptoms continue to worsen. Please help. I think I need a referral.    Patient Active Problem List   Diagnosis Date Noted   History of bilateral salpingectomy 08/24/2021   Chronic hypertension affecting pregnancy 08/22/2021   GDM (gestational diabetes mellitus) 04/03/2021   History of cesarean delivery 03/11/2021   Chronic hypertension 03/11/2021   History of IUFD 03/03/2021     Current Outpatient Medications on File Prior to Visit  Medication Sig Dispense Refill   acetaminophen (TYLENOL) 325 MG tablet Take 2 tablets (650 mg total) by mouth every 4 (four) hours as needed (for pain scale < 4). 60 tablet 0   ibuprofen (ADVIL) 600 MG tablet Take 1 tablet (600 mg total) by mouth every 6 (six) hours. 30 tablet 0   metoCLOPramide (REGLAN) 10 MG tablet Take 1 tablet (10 mg total) by mouth 3 (three) times daily before meals. Take 3 times a day for 30 days,Then take 2 a day for 4 days, Then take 1 a day for 4 days, Then stop 102 tablet 0   NIFEdipine (ADALAT CC) 30 MG 24 hr tablet Take 1 tablet (30 mg total) by mouth in the morning and at bedtime. 60 tablet 0   polyethylene glycol (MIRALAX / GLYCOLAX) 17 g packet Take 17 g by mouth daily.     Prenatal Vit-Fe Fumarate-FA (PREPLUS) 27-1 MG TABS Take 1 tablet by mouth daily. 30 tablet 13   [DISCONTINUED] misoprostol (CYTOTEC) 200 MCG tablet Take 2 tablets by  mouth and 2 intravaginally for one dose 4 tablet 0   No current facility-administered medications on file prior to visit.    Allergies  Allergen Reactions   Penicillins Shortness Of Breath and Palpitations    .Marland KitchenHas patient had a PCN reaction causing immediate rash, facial/tongue/throat swelling, SOB or lightheadedness with hypotension: NO Has patient had a PCN reaction causing severe rash involving mucus membranes or skin necrosis: NO Has patient had a PCN reaction that required hospitalization No Has patient had a PCN reaction occurring within the last 10 years: No If all of the above answers are "NO", then may proceed with Cephalosporin use.    Caffeine Other (See Comments)    Heart murmur    Social History   Socioeconomic History   Marital status: Married    Spouse name: Not on file   Number of children: Not on file   Years of education: Not on file   Highest education level: Not on file  Occupational History   Not on file  Tobacco Use   Smoking status: Some Days    Packs/day: 0.25    Types: Cigarettes   Smokeless tobacco: Never  Vaping Use   Vaping Use: Never used  Substance and Sexual Activity   Alcohol use: Not Currently    Alcohol/week: 3.0 standard drinks of alcohol    Types: 3  Standard drinks or equivalent per week    Comment: not during preg   Drug use: No   Sexual activity: Not Currently    Birth control/protection: None  Other Topics Concern   Not on file  Social History Narrative   Not on file   Social Determinants of Health   Financial Resource Strain: Not on file  Food Insecurity: Food Insecurity Present (08/20/2021)   Hunger Vital Sign    Worried About [place] in the Last Year: Sometimes true    Ran Out of Food in the Last Year: Sometimes true  Transportation Needs: No Transportation Needs (08/20/2021)   PRAPARE - Hydrologist (Medical): No    Lack of Transportation (Non-Medical): No  Physical Activity: Not  on file  Stress: Not on file  Social Connections: Not on file  Intimate Partner Violence: Not At Risk (03/11/2021)   Humiliation, Afraid, Rape, and Kick questionnaire    Fear of Current or Ex-Partner: No    Emotionally Abused: No    Physically Abused: No    Sexually Abused: No    Family History  Problem Relation Age of Onset   Hypertension Maternal Grandfather    Hypertension Mother    Thyroid disease Mother    Hypertension Maternal Grandmother    Thyroid disease Maternal Grandmother    Asthma Father    Asthma Maternal Aunt    Asthma Paternal Grandmother    Other Neg Hx    Hearing loss Neg Hx     Past Surgical History:  Procedure Laterality Date   CESAREAN SECTION  07/22/2012   Procedure: CESAREAN SECTION;  Surgeon: Frederico Hamman, MD;  Location: [facility];  Service: Obstetrics;  Laterality: N/A;  Primary Cesarean Section    GANGLION CYST EXCISION     MOUTH SURGERY     TUBAL LIGATION N/A 08/23/2021   Procedure: POST PARTUM TUBAL LIGATION;  Surgeon: Radene Gunning, MD;  Location: [facility];  Service: Gynecology;  Laterality: N/A;    ROS: Review of Systems Negative except as stated above  PHYSICAL EXAM: There were no vitals taken for this visit.  Physical Exam  {female adult master:310786} {female adult master:310785}     Latest Ref Rng & Units 08/22/2021    1:30 AM 08/20/2021   11:28 AM 08/03/2021    3:18 PM  CMP  Glucose 70 - 99 mg/dL 75  79  104   BUN 6 - 20 mg/dL 7  3  5    Creatinine 0.44 - 1.00 mg/dL 0.66  0.56  0.64   Sodium 135 - 145 mmol/L 134  137  136   Potassium 3.5 - 5.1 mmol/L 3.5  4.0  3.1   Chloride 98 - 111 mmol/L 104  105  106   CO2 22 - 32 mmol/L 19  19  21    Calcium 8.9 - 10.3 mg/dL 9.2  8.9  9.6   Total Protein 6.5 - 8.1 g/dL 5.9  5.8  5.6   Total Bilirubin 0.3 - 1.2 mg/dL 0.5  0.3  0.5   Alkaline Phos 38 - 126 U/L 180  174  141   AST 15 - 41 U/L 29  22  40   ALT 0 - 44 U/L 23  19  34    Lipid Panel     Component Value Date/Time   CHOL 183  10/07/2020 1150   TRIG 72 10/07/2020 1150   HDL 47 10/07/2020 1150   CHOLHDL 3.9  10/07/2020 1150   LDLCALC 123 (H) 10/07/2020 1150    CBC    Component Value Date/Time   WBC 6.4 08/22/2021 1132   RBC 4.14 08/22/2021 1132   HGB 12.9 08/22/2021 1132   HGB 12.0 08/20/2021 1128   HCT 37.4 08/22/2021 1132   HCT 35.8 08/20/2021 1128   PLT 314 08/22/2021 1132   PLT 329 08/20/2021 1128   MCV 90.3 08/22/2021 1132   MCV 92 08/20/2021 1128   MCH 31.2 08/22/2021 1132   MCHC 34.5 08/22/2021 1132   RDW 13.0 08/22/2021 1132   RDW 13.0 08/20/2021 1128   LYMPHSABS 2.9 03/11/2021 1444   MONOABS 0.8 06/26/2019 1923   EOSABS 0.2 03/11/2021 1444   BASOSABS 0.0 03/11/2021 1444    ASSESSMENT AND PLAN:  There are no diagnoses linked to this encounter.   Patient was given the opportunity to ask questions.  Patient verbalized understanding of the plan and was able to repeat key elements of the plan. Patient was given clear instructions to go to Emergency Department or return to medical center if symptoms don't improve, worsen, or new problems develop.The patient verbalized understanding.   No orders of the defined types were placed in this encounter.    Requested Prescriptions    No prescriptions requested or ordered in this encounter    No follow-ups on file.  Camillia Herter, NP

## 2021-12-29 ENCOUNTER — Ambulatory Visit: Payer: Medicaid Other | Admitting: Family

## 2021-12-29 NOTE — Progress Notes (Unsigned)
Patient ID: Morgan Newton, female    DOB: 23-Oct-1985  MRN: [id number]  CC: No chief complaint on file.   Subjective: Morgan Newton is a 36 y.o. female who presents for  Her concerns today include: ***  My hands have started to dehydrate, peel, crack and weep. I've been using hydrocortisone, A&D cream, aquafor, Vaseline, I even tried an excema cream T.A 0.025%. Nothing is working. I also am having an issue with my right wrist and thumb. Feels like it hyper extends or pops out of place doing simple daily task. This has been occuring since the recent pregnancy. Baby was born 08/20/21. The symptoms continue to worsen. Please help. I think I need a referral.    Patient Active Problem List   Diagnosis Date Noted   History of bilateral salpingectomy 08/24/2021   Chronic hypertension affecting pregnancy 08/22/2021   GDM (gestational diabetes mellitus) 04/03/2021   History of cesarean delivery 03/11/2021   Chronic hypertension 03/11/2021   History of IUFD 03/03/2021     Current Outpatient Medications on File Prior to Visit  Medication Sig Dispense Refill   acetaminophen (TYLENOL) 325 MG tablet Take 2 tablets (650 mg total) by mouth every 4 (four) hours as needed (for pain scale < 4). 60 tablet 0   ibuprofen (ADVIL) 600 MG tablet Take 1 tablet (600 mg total) by mouth every 6 (six) hours. 30 tablet 0   metoCLOPramide (REGLAN) 10 MG tablet Take 1 tablet (10 mg total) by mouth 3 (three) times daily before meals. Take 3 times a day for 30 days,Then take 2 a day for 4 days, Then take 1 a day for 4 days, Then stop 102 tablet 0   NIFEdipine (ADALAT CC) 30 MG 24 hr tablet Take 1 tablet (30 mg total) by mouth in the morning and at bedtime. 60 tablet 0   polyethylene glycol (MIRALAX / GLYCOLAX) 17 g packet Take 17 g by mouth daily.     Prenatal Vit-Fe Fumarate-FA (PREPLUS) 27-1 MG TABS Take 1 tablet by mouth daily. 30 tablet 13   [DISCONTINUED] misoprostol (CYTOTEC) 200 MCG tablet Take 2 tablets by  mouth and 2 intravaginally for one dose 4 tablet 0   No current facility-administered medications on file prior to visit.    Allergies  Allergen Reactions   Penicillins Shortness Of Breath and Palpitations    .Marland KitchenHas patient had a PCN reaction causing immediate rash, facial/tongue/throat swelling, SOB or lightheadedness with hypotension: NO Has patient had a PCN reaction causing severe rash involving mucus membranes or skin necrosis: NO Has patient had a PCN reaction that required hospitalization No Has patient had a PCN reaction occurring within the last 10 years: No If all of the above answers are "NO", then may proceed with Cephalosporin use.    Caffeine Other (See Comments)    Heart murmur    Social History   Socioeconomic History   Marital status: Married    Spouse name: Not on file   Number of children: Not on file   Years of education: Not on file   Highest education level: Not on file  Occupational History   Not on file  Tobacco Use   Smoking status: Some Days    Packs/day: 0.25    Types: Cigarettes   Smokeless tobacco: Never  Vaping Use   Vaping Use: Never used  Substance and Sexual Activity   Alcohol use: Not Currently    Alcohol/week: 3.0 standard drinks of alcohol    Types: 3  Standard drinks or equivalent per week    Comment: not during preg   Drug use: No   Sexual activity: Not Currently    Birth control/protection: None  Other Topics Concern   Not on file  Social History Narrative   Not on file   Social Determinants of Health   Financial Resource Strain: Not on file  Food Insecurity: Food Insecurity Present (08/20/2021)   Hunger Vital Sign    Worried About [place] in the Last Year: Sometimes true    Ran Out of Food in the Last Year: Sometimes true  Transportation Needs: No Transportation Needs (08/20/2021)   PRAPARE - Hydrologist (Medical): No    Lack of Transportation (Non-Medical): No  Physical Activity: Not  on file  Stress: Not on file  Social Connections: Not on file  Intimate Partner Violence: Not At Risk (03/11/2021)   Humiliation, Afraid, Rape, and Kick questionnaire    Fear of Current or Ex-Partner: No    Emotionally Abused: No    Physically Abused: No    Sexually Abused: No    Family History  Problem Relation Age of Onset   Hypertension Maternal Grandfather    Hypertension Mother    Thyroid disease Mother    Hypertension Maternal Grandmother    Thyroid disease Maternal Grandmother    Asthma Father    Asthma Maternal Aunt    Asthma Paternal Grandmother    Other Neg Hx    Hearing loss Neg Hx     Past Surgical History:  Procedure Laterality Date   CESAREAN SECTION  07/22/2012   Procedure: CESAREAN SECTION;  Surgeon: Frederico Hamman, MD;  Location: [facility];  Service: Obstetrics;  Laterality: N/A;  Primary Cesarean Section    GANGLION CYST EXCISION     MOUTH SURGERY     TUBAL LIGATION N/A 08/23/2021   Procedure: POST PARTUM TUBAL LIGATION;  Surgeon: Radene Gunning, MD;  Location: [facility];  Service: Gynecology;  Laterality: N/A;    ROS: Review of Systems Negative except as stated above  PHYSICAL EXAM: There were no vitals taken for this visit.  Physical Exam  {female adult master:310786} {female adult master:310785}     Latest Ref Rng & Units 08/22/2021    1:30 AM 08/20/2021   11:28 AM 08/03/2021    3:18 PM  CMP  Glucose 70 - 99 mg/dL 75  79  104   BUN 6 - 20 mg/dL 7  3  5    Creatinine 0.44 - 1.00 mg/dL 0.66  0.56  0.64   Sodium 135 - 145 mmol/L 134  137  136   Potassium 3.5 - 5.1 mmol/L 3.5  4.0  3.1   Chloride 98 - 111 mmol/L 104  105  106   CO2 22 - 32 mmol/L 19  19  21    Calcium 8.9 - 10.3 mg/dL 9.2  8.9  9.6   Total Protein 6.5 - 8.1 g/dL 5.9  5.8  5.6   Total Bilirubin 0.3 - 1.2 mg/dL 0.5  0.3  0.5   Alkaline Phos 38 - 126 U/L 180  174  141   AST 15 - 41 U/L 29  22  40   ALT 0 - 44 U/L 23  19  34    Lipid Panel     Component Value Date/Time   CHOL 183  10/07/2020 1150   TRIG 72 10/07/2020 1150   HDL 47 10/07/2020 1150   CHOLHDL 3.9  10/07/2020 1150   LDLCALC 123 (H) 10/07/2020 1150    CBC    Component Value Date/Time   WBC 6.4 08/22/2021 1132   RBC 4.14 08/22/2021 1132   HGB 12.9 08/22/2021 1132   HGB 12.0 08/20/2021 1128   HCT 37.4 08/22/2021 1132   HCT 35.8 08/20/2021 1128   PLT 314 08/22/2021 1132   PLT 329 08/20/2021 1128   MCV 90.3 08/22/2021 1132   MCV 92 08/20/2021 1128   MCH 31.2 08/22/2021 1132   MCHC 34.5 08/22/2021 1132   RDW 13.0 08/22/2021 1132   RDW 13.0 08/20/2021 1128   LYMPHSABS 2.9 03/11/2021 1444   MONOABS 0.8 06/26/2019 1923   EOSABS 0.2 03/11/2021 1444   BASOSABS 0.0 03/11/2021 1444    ASSESSMENT AND PLAN:  There are no diagnoses linked to this encounter.   Patient was given the opportunity to ask questions.  Patient verbalized understanding of the plan and was able to repeat key elements of the plan. Patient was given clear instructions to go to Emergency Department or return to medical center if symptoms don't improve, worsen, or new problems develop.The patient verbalized understanding.   No orders of the defined types were placed in this encounter.    Requested Prescriptions    No prescriptions requested or ordered in this encounter    No follow-ups on file.  Camillia Herter, NP

## 2021-12-30 ENCOUNTER — Ambulatory Visit (INDEPENDENT_AMBULATORY_CARE_PROVIDER_SITE_OTHER): Payer: Medicaid Other | Admitting: Family

## 2021-12-30 ENCOUNTER — Encounter: Payer: Self-pay | Admitting: Family

## 2021-12-30 VITALS — BP 118/88 | HR 62 | Temp 98.3°F | Resp 18 | Ht 59.92 in | Wt 186.0 lb

## 2021-12-30 DIAGNOSIS — M25531 Pain in right wrist: Secondary | ICD-10-CM | POA: Diagnosis not present

## 2021-12-30 DIAGNOSIS — L309 Dermatitis, unspecified: Secondary | ICD-10-CM | POA: Diagnosis not present

## 2021-12-30 DIAGNOSIS — M79641 Pain in right hand: Secondary | ICD-10-CM

## 2021-12-30 DIAGNOSIS — M79644 Pain in right finger(s): Secondary | ICD-10-CM

## 2021-12-30 MED ORDER — GABAPENTIN 300 MG PO CAPS: 300.0000 mg | ORAL_CAPSULE | Freq: Three times a day (TID) | ORAL | 1 refills | Status: DC

## 2021-12-30 NOTE — Progress Notes (Signed)
Pt presents for eczema on both palms of hands happened during pregnancy and states she feels like she has carpal tunnel in wrist and thumb of left hand, states that thumb pops and limited mobility

## 2021-12-31 ENCOUNTER — Telehealth: Payer: Self-pay | Admitting: Family

## 2022-01-07 ENCOUNTER — Ambulatory Visit: Payer: Medicaid Other | Admitting: Orthopedic Surgery

## 2022-01-09 IMAGING — DX DG FOOT COMPLETE 3+V*L*
3 series · 3 of 3 positions shown · non-contrast
Comparison: None.

CLINICAL DATA: Caught left fifth toe on a piece of furniture
yesterday no

EXAM:
LEFT FOOT - COMPLETE 3+ VIEW

[foot supine dp]
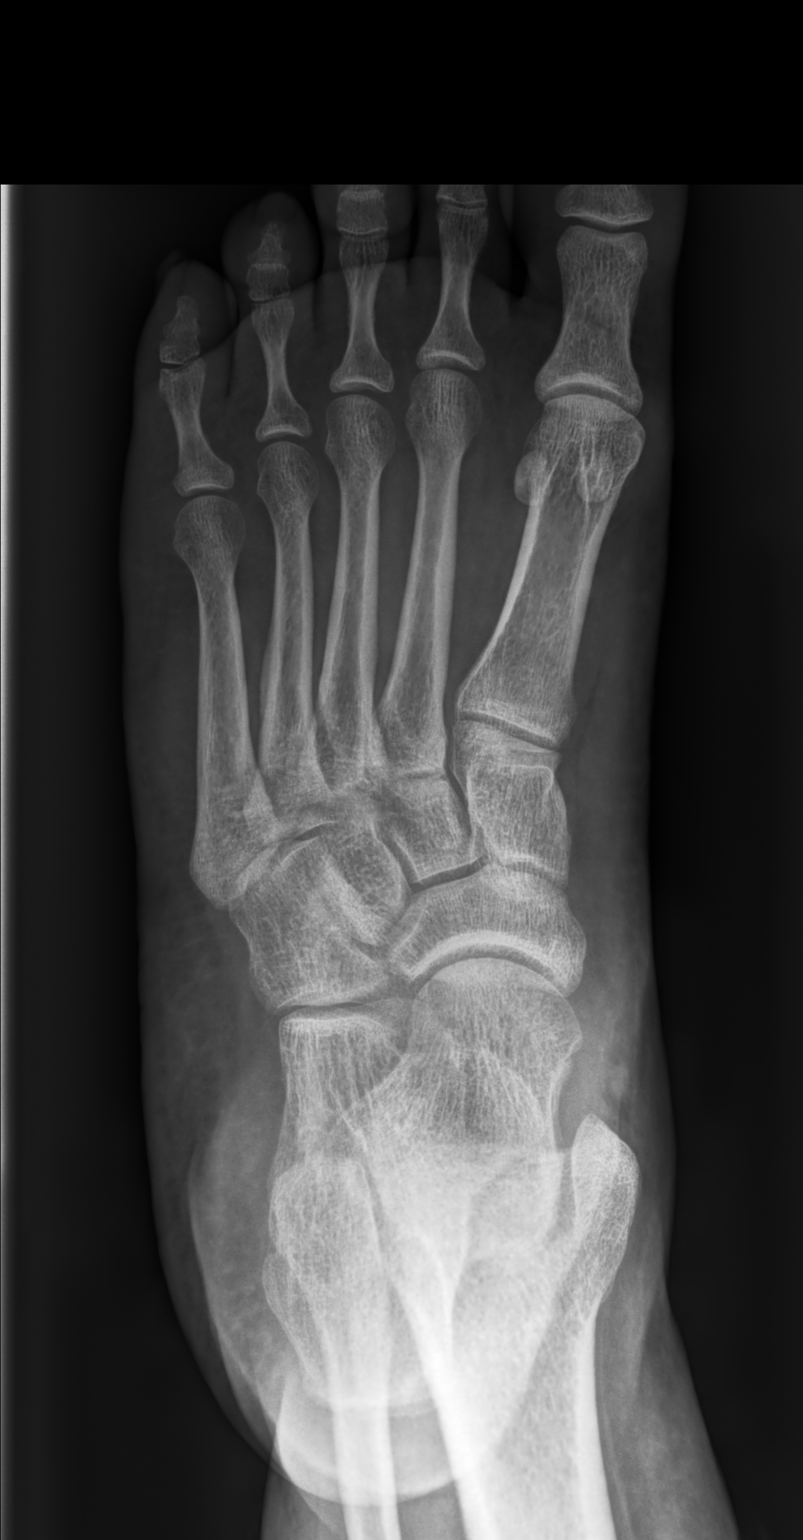

[foot medial oblique]
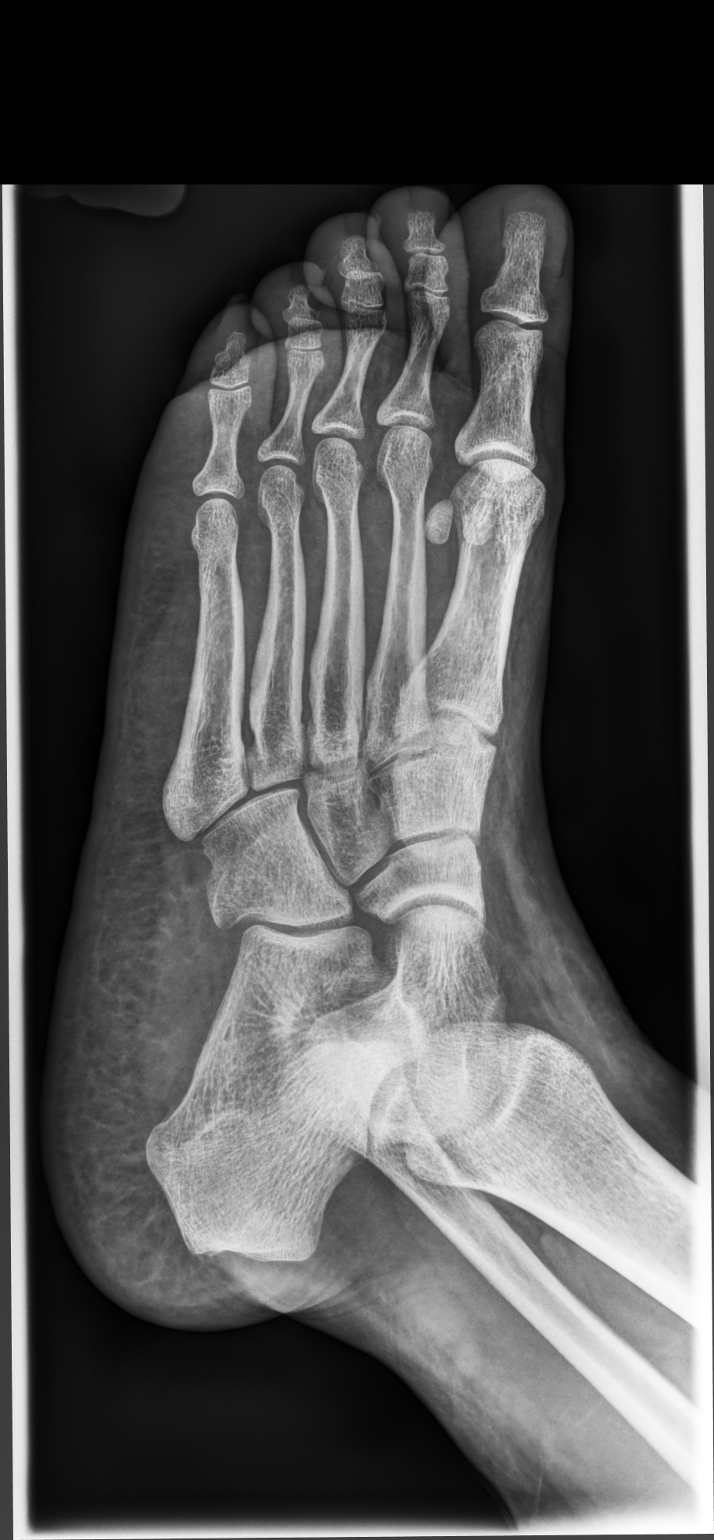

[foot supine lat]
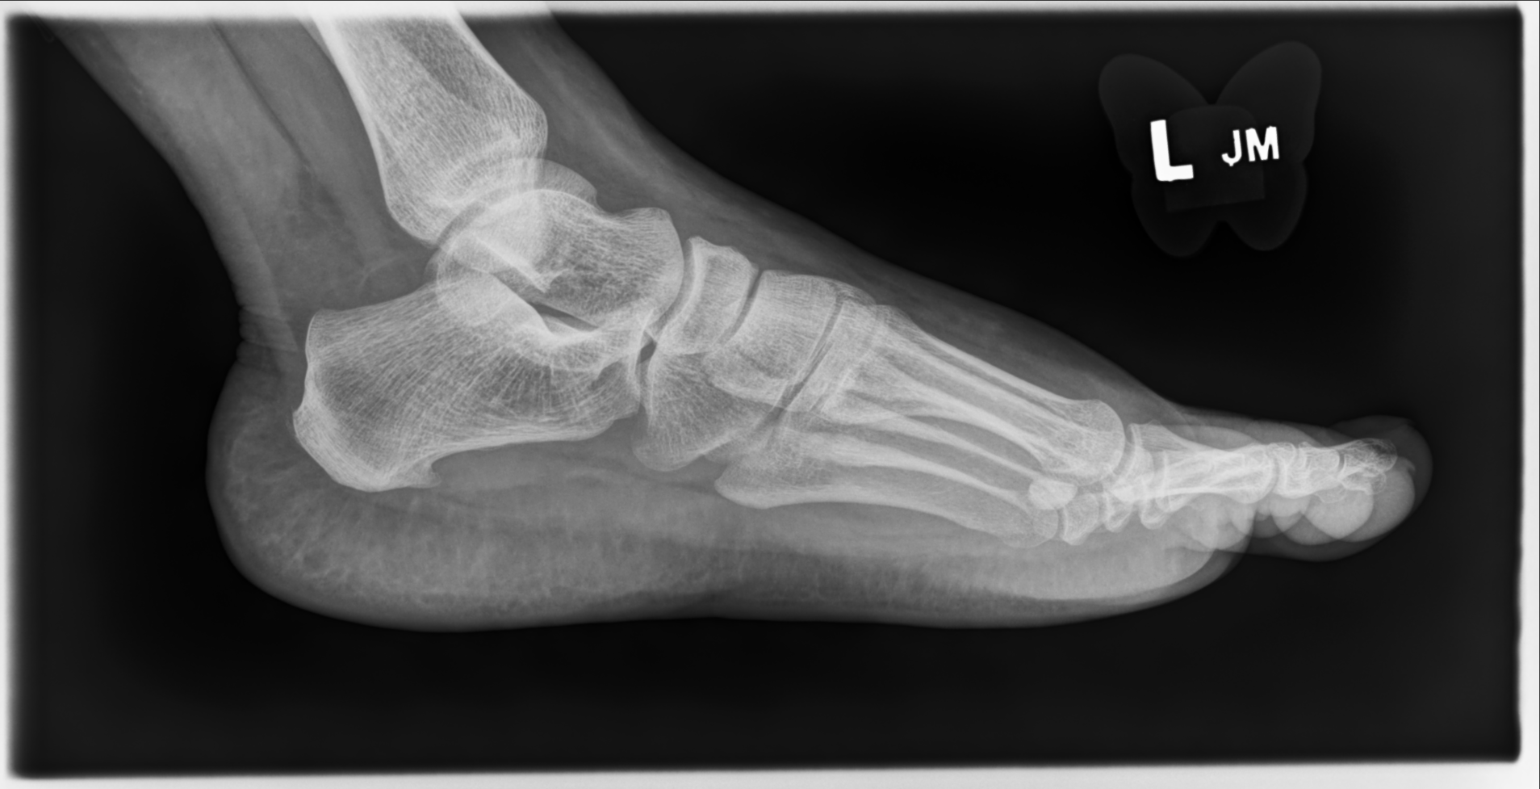

[3 of 3 positions shown; findings below may reference images not displayed]

FINDINGS: There is no evidence of fracture or dislocation. There is no
evidence of arthropathy or other focal bone abnormality. Plantar
calcaneal enthesophyte. Soft tissues are unremarkable.
IMPRESSION: Negative.

## 2022-01-29 ENCOUNTER — Ambulatory Visit (INDEPENDENT_AMBULATORY_CARE_PROVIDER_SITE_OTHER): Payer: Medicaid Other | Admitting: Orthopedic Surgery

## 2022-01-29 DIAGNOSIS — M654 Radial styloid tenosynovitis [de Quervain]: Secondary | ICD-10-CM | POA: Diagnosis not present

## 2022-01-29 MED ORDER — BETAMETHASONE SOD PHOS & ACET 6 (3-3) MG/ML IJ SUSP
6.0000 mg | INTRAMUSCULAR | Status: AC | PRN
  Administered 2022-01-29: 6 mg via INTRA_ARTICULAR

## 2022-01-29 MED ORDER — LIDOCAINE HCL 1 % IJ SOLN
1.0000 mL | INTRAMUSCULAR | Status: AC | PRN
  Administered 2022-01-29: 1 mL

## 2022-01-29 NOTE — Progress Notes (Signed)
Office Visit Note   Patient: Morgan Newton           Date of Birth: 01-Feb-1986           MRN: [id number] Visit Date: 01/29/2022              Requested by: Camillia Herter, NP [address],  [place] [address] PCP: Camillia Herter, NP   Assessment & Plan: Visit Diagnoses:  1. De Quervain's tenosynovitis, left     Plan: Discussed with patient that her radial sided wrist pain seems most consistent with De Quervain's tenosynovitis.  We reviewed the nature of this condition as well as its diagnosis, prognosis, and both conservative and surgical treatment options.  After our discussion, she would like to proceed with corticosteroid injection.  I can see her back in two months if she remains symptomatic.   Follow-Up Instructions: No follow-ups on file.   Orders:  No orders of the defined types were placed in this encounter.  No orders of the defined types were placed in this encounter.     Procedures: Hand/UE Inj: L extensor compartment 1 for de Quervain's tenosynovitis on 01/29/2022 5:36 PM Indications: tendon swelling and therapeutic Details: 25 G needle, radial approach Medications: 1 mL lidocaine 1 %; 6 mg betamethasone acetate-betamethasone sodium phosphate 6 (3-3) MG/ML Procedure, treatment alternatives, risks and benefits explained, specific risks discussed. Consent was given by the patient. Immediately prior to procedure a time out was called to verify the correct patient, procedure, equipment, support staff and site/side marked as required. Patient was prepped and draped in the usual sterile fashion.       Clinical Data: No additional findings.   Subjective: Chief Complaint  Patient presents with   Left Wrist - Pain    This is a 36 year old right-hand-dominant female presents with radial sided left wrist pain.  This started during her recent pregnancy and has continued now 5 months after delivery.  She describes pain at the radial aspect of the  wrist around the radial styloid.  She feels a popping sensation over the thumb.  Her pain is worse with her activities that involve lifting or radial ulnar deviation of the wrist.  She does describe some swelling in the area at times.  Her pain is generally 3/10 in severity.    Review of Systems   Objective: Vital Signs: There were no vitals taken for this visit.  Physical Exam Constitutional:      Appearance: Normal appearance.  Cardiovascular:     Rate and Rhythm: Normal rate.     Pulses: Normal pulses.  Pulmonary:     Effort: Pulmonary effort is normal.  Skin:    General: Skin is warm.     Capillary Refill: Capillary refill takes less than 2 seconds.  Neurological:     Mental Status: She is alert.     Left Hand Exam   Tenderness  Left hand tenderness location: TTP over radial styloid and first dorsal compartment.   Range of Motion  The patient has normal left wrist ROM.  Tests  Finkelstein's test: positive  Other  Erythema: absent Sensation: normal Pulse: present      Specialty Comments:  No specialty comments available.  Imaging: No results found.   PMFS History: Patient Active Problem List   Diagnosis Date Noted   De Quervain's tenosynovitis, left 01/29/2022   History of bilateral salpingectomy 08/24/2021   Chronic hypertension affecting pregnancy 08/22/2021   GDM (gestational diabetes  mellitus) 04/03/2021   History of cesarean delivery 03/11/2021   Chronic hypertension 03/11/2021   History of IUFD 03/03/2021   Past Medical History:  Diagnosis Date   Abnormal Pap smear 2012   colpo   Anemia    Asthma    Chlamydia    Depression    DUB (dysfunctional uterine bleeding) 02/04/2021   GDM (gestational diabetes mellitus) 04/03/2021   Genital herpes    Gonorrhea    Heart murmur    History of IUFD 03/03/2021   Hypertension    PIH   PID (acute pelvic inflammatory disease)    Pregnancy induced hypertension    Trichimoniasis     Family  History  Problem Relation Age of Onset   Hypertension Maternal Grandfather    Hypertension Mother    Thyroid disease Mother    Hypertension Maternal Grandmother    Thyroid disease Maternal Grandmother    Asthma Father    Asthma Maternal Aunt    Asthma Paternal Grandmother    Other Neg Hx    Hearing loss Neg Hx     Past Surgical History:  Procedure Laterality Date   CESAREAN SECTION  07/22/2012   Procedure: CESAREAN SECTION;  Surgeon: Frederico Hamman, MD;  Location: [facility];  Service: Obstetrics;  Laterality: N/A;  Primary Cesarean Section    GANGLION CYST EXCISION     MOUTH SURGERY     TUBAL LIGATION N/A 08/23/2021   Procedure: POST PARTUM TUBAL LIGATION;  Surgeon: Radene Gunning, MD;  Location: [facility];  Service: Gynecology;  Laterality: N/A;   Social History   Occupational History   Not on file  Tobacco Use   Smoking status: Some Days    Packs/day: 0.25    Types: Cigarettes    Passive exposure: Past   Smokeless tobacco: Never  Vaping Use   Vaping Use: Never used  Substance and Sexual Activity   Alcohol use: Not Currently    Alcohol/week: 3.0 standard drinks of alcohol    Types: 3 Standard drinks or equivalent per week    Comment: not during preg   Drug use: No   Sexual activity: Not Currently    Birth control/protection: None

## 2022-01-30 IMAGING — US US FETAL BPP W/ NON-STRESS
1 series · 11 of 11 positions shown · non-contrast
Comparison: none

[Series 1: us fetal bpp w/ non-stress · 11 acquisitions, 11 frames shown]
[im 1/11]
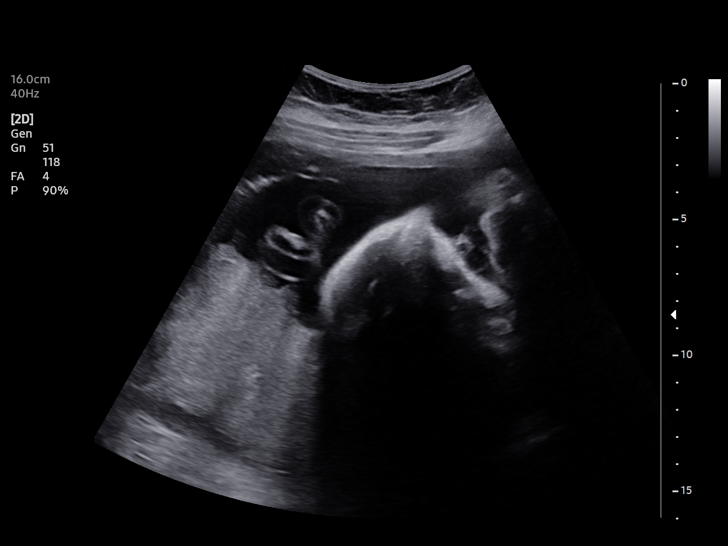
[im 2/11]
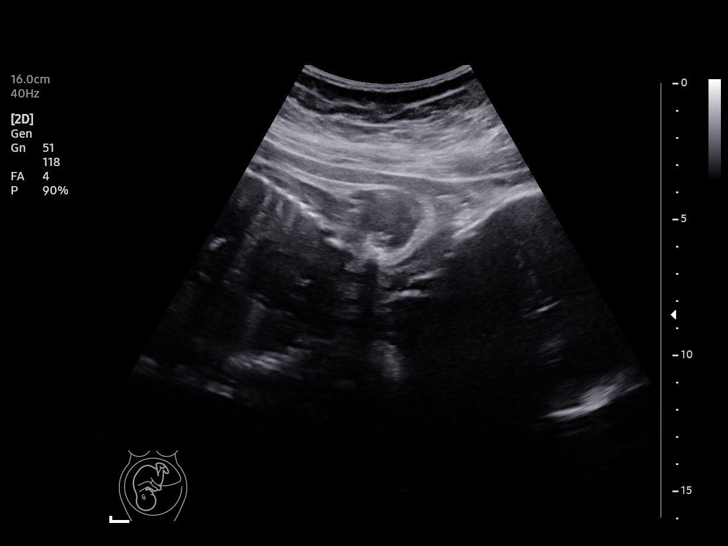
[im 3/11]
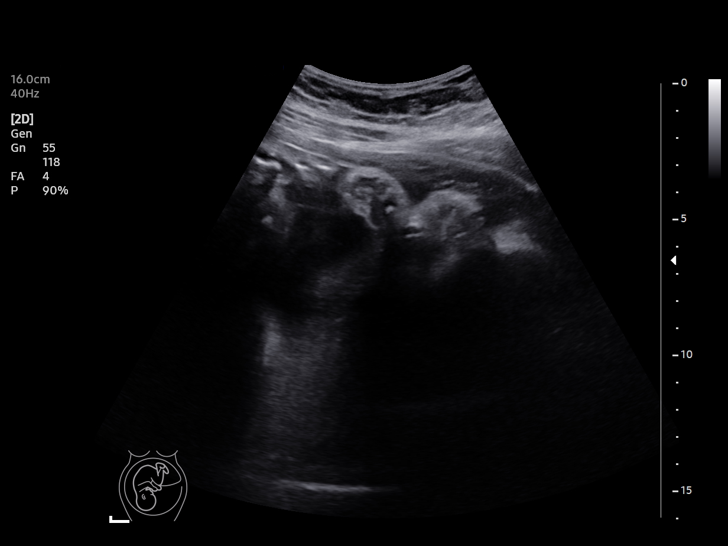
[im 4/11]
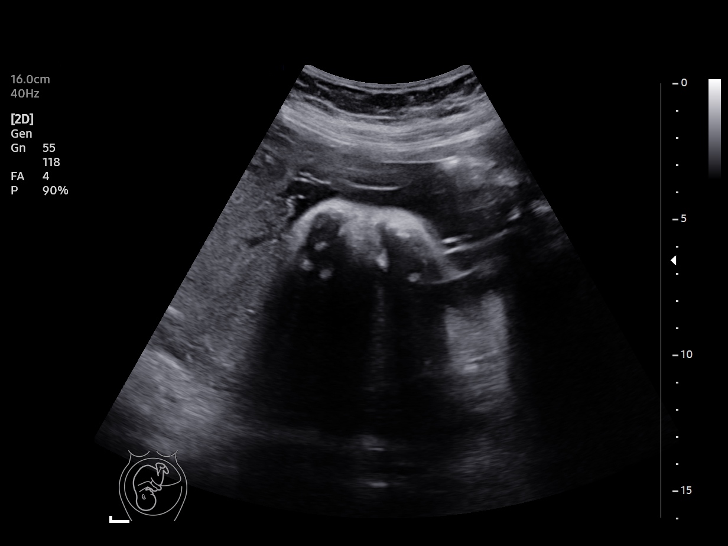
[im 5/11]
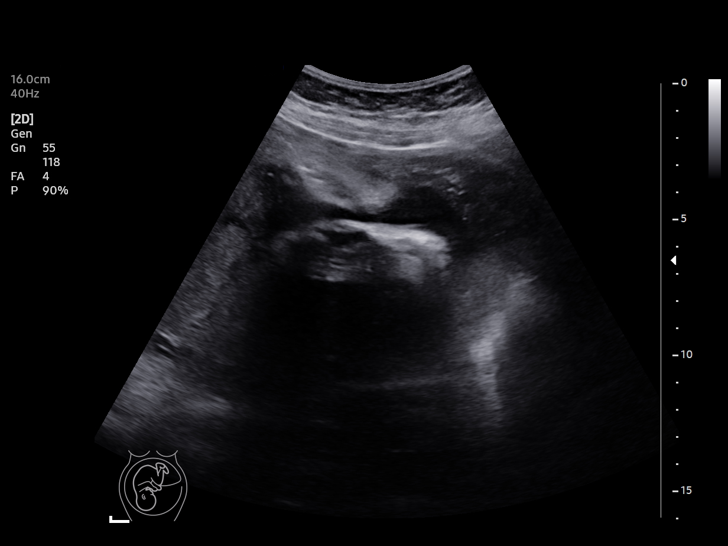
[im 6/11]
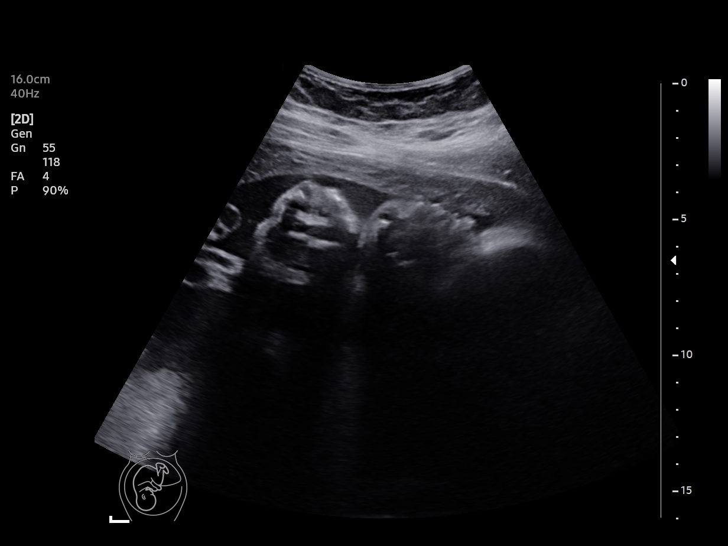
[im 7/11]
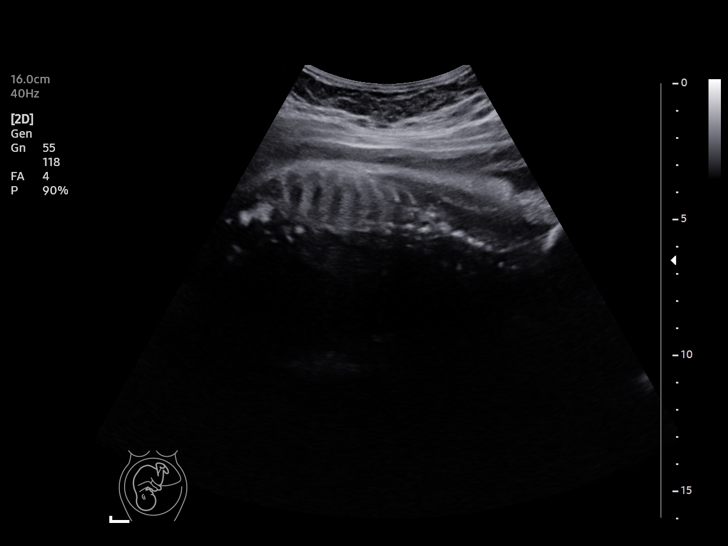
[im 8/11]
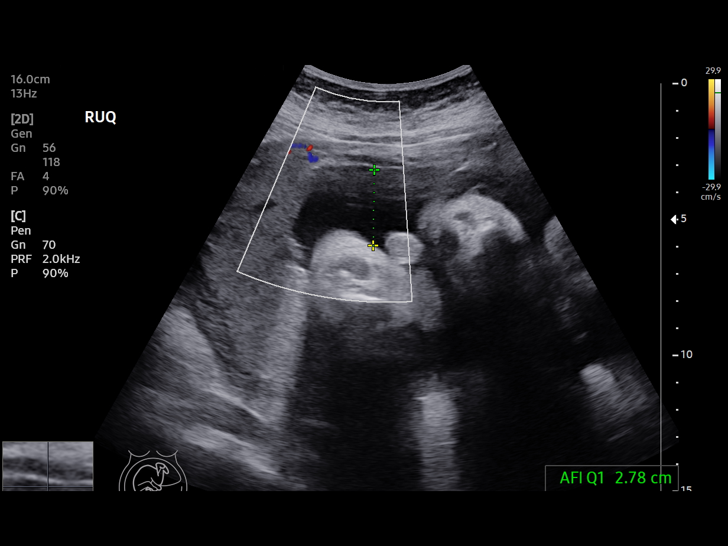
[im 9/11]
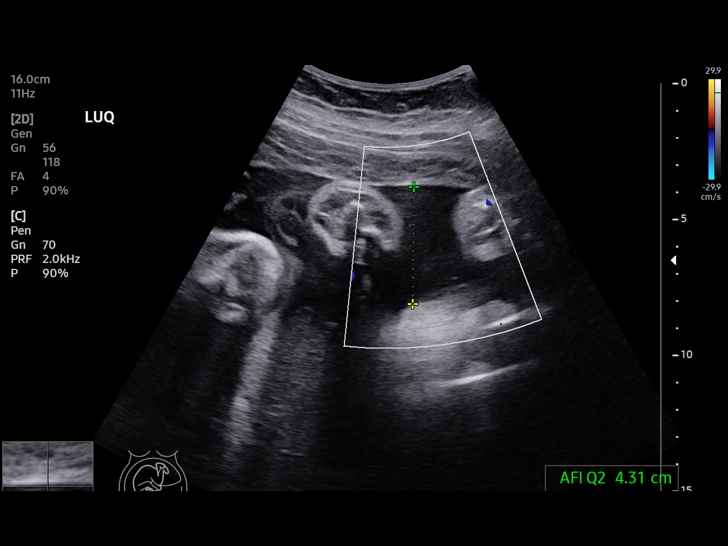
[im 10/11]
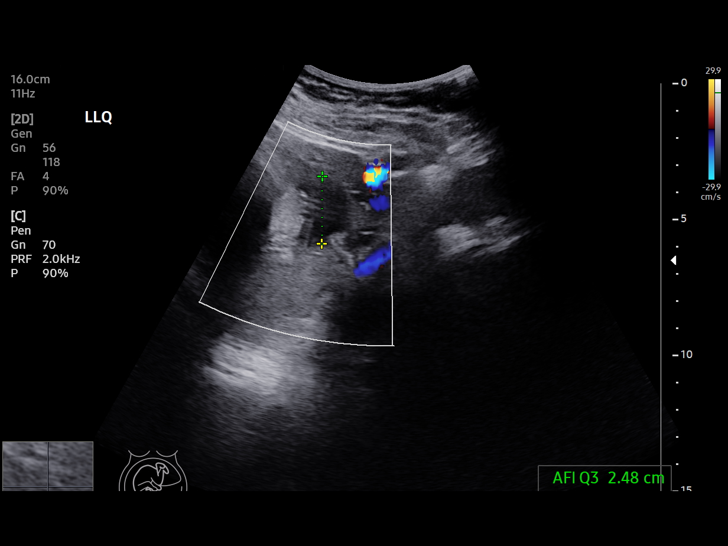
[im 11/11]
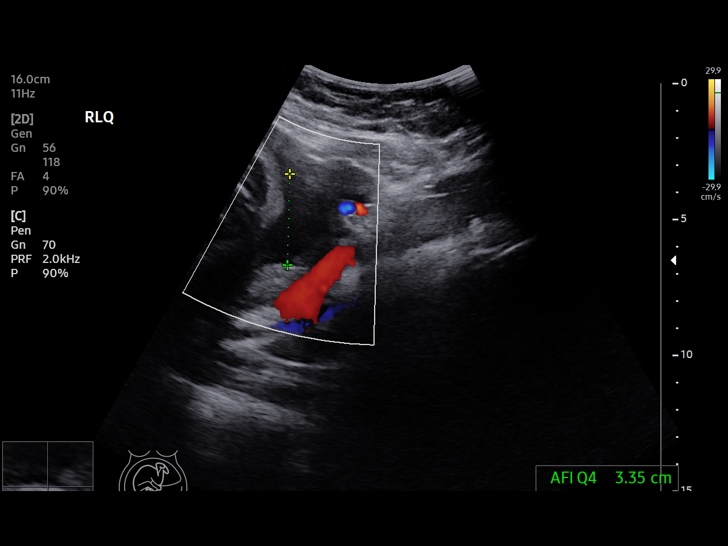

[11 of 11 positions shown; findings below may reference images not displayed]

[REDACTED]care at

Service(s) Provided

Indications

 33 weeks gestation of pregnancy
 Gestational diabetes in pregnancy,
 controlled by oral hypoglycemic drugs
 Poor obstetric history: Previous IUFD
 (stillbirth)
Fetal Evaluation

 Num Of Fetuses:         1
 Preg. Location:         Intrauterine
 Cardiac Activity:       Observed
 Presentation:           Cephalic

 Amniotic Fluid
 AFI FV:      Within normal limits

 AFI Sum(cm)     %Tile       Largest Pocket(cm)
 12.92           40

 RUQ(cm)       RLQ(cm)       LUQ(cm)        LLQ(cm)

Biophysical Evaluation
 Amniotic F.V:   Pocket => 2 cm             F. Tone:        Observed
 F. Movement:    Observed                   N.S.T:          Reactive
 F. Breathing:   Not Observed               Score:          [DATE]
OB History

 Gravidity:    9         Term:   2        Prem:   1        SAB:   2
 TOP:          3        Living:  2
Gestational Age

 LMP:           49w 2d        Date:  08/11/20                 EDD:   05/18/21
 Best:          33w 4d     Det. By:  Early Ultrasound         EDD:   09/05/21
                                     (02/23/21)
Impression

 Antenatal testing due to ZHJSJ on metformin, history of
 IUFD.
 Testing is reassuring, BPP [DATE].
 -2 for breathing.
Recommendations

 Continue weekly antenatal testing till delivery .
               Jumper, Giorgi

## 2022-02-10 ENCOUNTER — Ambulatory Visit
Admission: EM | Admit: 2022-02-10 | Discharge: 2022-02-10 | Disposition: A | Discharge status: Home / Self Care | Payer: Medicaid Other | Attending: Physician Assistant | Admitting: Physician Assistant

## 2022-02-10 DIAGNOSIS — S161XXA Strain of muscle, fascia and tendon at neck level, initial encounter: Secondary | ICD-10-CM | POA: Diagnosis not present

## 2022-02-10 MED ORDER — CYCLOBENZAPRINE HCL 10 MG PO TABS: 10.0000 mg | ORAL_TABLET | Freq: Two times a day (BID) | ORAL | 0 refills | Status: DC | PRN

## 2022-02-10 MED ORDER — KETOROLAC TROMETHAMINE 30 MG/ML IJ SOLN
30.0000 mg | Freq: Once | INTRAMUSCULAR | Status: AC
  Administered 2022-02-10: 30 mg via INTRAMUSCULAR

## 2022-02-10 NOTE — ED Provider Notes (Signed)
[name]    CSN: [id number] Arrival date & time: 02/10/22  5188      History   Chief Complaint Chief Complaint  Patient presents with   Neck Pain    HPI Morgan Newton is a 36 y.o. female.   Patient here today for evaluation of left-sided neck pain that started this morning.  She states that she woke up quickly got out of bed quickly because she felt she was late for work.  She reports she thought she heard a pop to the left side of her neck and then shortly after started to have pain.  She notes that movement makes pain worse.  She denies any numbness or tingling.  She has not had neck injury in the past.  She has tried Tylenol with mild relief.  The history is provided by the patient.    Past Medical History:  Diagnosis Date   Abnormal Pap smear 2012   colpo   Anemia    Asthma    Chlamydia    Depression    DUB (dysfunctional uterine bleeding) 02/04/2021   GDM (gestational diabetes mellitus) 04/03/2021   Genital herpes    Gonorrhea    Heart murmur    History of IUFD 03/03/2021   Hypertension    PIH   PID (acute pelvic inflammatory disease)    Pregnancy induced hypertension    Trichimoniasis     Patient Active Problem List   Diagnosis Date Noted   De Quervain's tenosynovitis, left 01/29/2022   History of bilateral salpingectomy 08/24/2021   Chronic hypertension affecting pregnancy 08/22/2021   GDM (gestational diabetes mellitus) 04/03/2021   History of cesarean delivery 03/11/2021   Chronic hypertension 03/11/2021   History of IUFD 03/03/2021    Past Surgical History:  Procedure Laterality Date   CESAREAN SECTION  07/22/2012   Procedure: CESAREAN SECTION;  Surgeon: Frederico Hamman, MD;  Location: [facility];  Service: Obstetrics;  Laterality: N/A;  Primary Cesarean Section    GANGLION CYST EXCISION     MOUTH SURGERY     TUBAL LIGATION N/A 08/23/2021   Procedure: POST PARTUM TUBAL LIGATION;  Surgeon: Radene Gunning, MD;  Location: [facility];   Service: Gynecology;  Laterality: N/A;    OB History     Gravida  9   Para  3   Term  2   Preterm  1   AB  5   Living  2      SAB  2   IAB  3   Ectopic      Multiple      Live Births  2            Home Medications    Prior to Admission medications   Medication Sig Start Date End Date Taking? Authorizing Provider  cyclobenzaprine (FLEXERIL) 10 MG tablet Take 1 tablet (10 mg total) by mouth 2 (two) times daily as needed for muscle spasms. 02/10/22  Yes Francene Finders, PA-C  acetaminophen (TYLENOL) 325 MG tablet Take 2 tablets (650 mg total) by mouth every 4 (four) hours as needed (for pain scale < 4). 08/24/21   Renard Matter, MD  gabapentin (NEURONTIN) 300 MG capsule Take 1 capsule (300 mg total) by mouth 3 (three) times daily. 12/30/21 02/28/22  Camillia Herter, NP  ibuprofen (ADVIL) 600 MG tablet Take 1 tablet (600 mg total) by mouth every 6 (six) hours. 08/24/21   Renard Matter, MD  metoCLOPramide (REGLAN) 10 MG tablet Take  1 tablet (10 mg total) by mouth 3 (three) times daily before meals. Take 3 times a day for 30 days,Then take 2 a day for 4 days, Then take 1 a day for 4 days, Then stop 10/06/21   Caren Macadam, MD  NIFEdipine (ADALAT CC) 30 MG 24 hr tablet Take 1 tablet (30 mg total) by mouth in the morning and at bedtime. 08/24/21 10/23/21  Renard Matter, MD  polyethylene glycol (MIRALAX / GLYCOLAX) 17 g packet Take 17 g by mouth daily.    [provider]  Prenatal Vit-Fe Fumarate-FA (PREPLUS) 27-1 MG TABS Take 1 tablet by mouth daily. 08/06/21   Chancy Milroy, MD  misoprostol (CYTOTEC) 200 MCG tablet Take 2 tablets by mouth and 2 intravaginally for one dose 04/14/20 07/24/20  Woodroe Mode, MD    Family History Family History  Problem Relation Age of Onset   Hypertension Maternal Grandfather    Hypertension Mother    Thyroid disease Mother    Hypertension Maternal Grandmother    Thyroid disease Maternal Grandmother    Asthma Father    Asthma Maternal  Aunt    Asthma Paternal Grandmother    Other Neg Hx    Hearing loss Neg Hx     Social History Social History   Tobacco Use   Smoking status: Some Days    Packs/day: 0.25    Types: Cigarettes    Passive exposure: Past   Smokeless tobacco: Never  Vaping Use   Vaping Use: Never used  Substance Use Topics   Alcohol use: Not Currently    Alcohol/week: 3.0 standard drinks of alcohol    Types: 3 Standard drinks or equivalent per week    Comment: not during preg   Drug use: No     Allergies   Penicillins and Caffeine   Review of Systems Review of Systems  Constitutional:  Negative for chills and fever.  Eyes:  Negative for discharge and redness.  Gastrointestinal:  Negative for abdominal pain, nausea and vomiting.  Musculoskeletal:  Positive for myalgias and neck pain.  Neurological:  Negative for weakness and numbness.     Physical Exam Triage Vital Signs ED Triage Vitals  Enc Vitals Group     BP      Pulse      Resp      Temp      Temp src      SpO2      Weight      Height      Head Circumference      Peak Flow      Pain Score      Pain Loc      Pain Edu?      Excl. in [place]?    No data found.  Updated Vital Signs BP 130/88 (BP Location: Left Arm)   Pulse 74   Temp 98.5 F (36.9 C) (Oral)   Resp 19   Ht 4' 11"  (1.499 m)   SpO2 99%   BMI 37.57 kg/m      Physical Exam Vitals and nursing note reviewed.  Constitutional:      General: She is not in acute distress.    Appearance: Normal appearance. She is not ill-appearing.  HENT:     Head: Normocephalic and atraumatic.  Eyes:     Conjunctiva/sclera: Conjunctivae normal.  Cardiovascular:     Rate and Rhythm: Normal rate.  Pulmonary:     Effort: Pulmonary effort is normal.  Musculoskeletal:  Comments: No TTP to midline C spine, mild TTP noted to left cervical trapezius, Decreased rotation of neck to the left, full flexion and extension with mild pain.   Neurological:     Mental Status: She  is alert.  Psychiatric:        Mood and Affect: Mood normal.        Behavior: Behavior normal.        Thought Content: Thought content normal.      UC Treatments / Results  Labs (all labs ordered are listed, but only abnormal results are displayed) Labs Reviewed - No data to display  EKG   Radiology No results found.  Procedures Procedures (including critical care time)  Medications Ordered in UC Medications  ketorolac (TORADOL) 30 MG/ML injection 30 mg (30 mg Intramuscular Given 02/10/22 1000)    Initial Impression / Assessment and Plan / UC Course  I have reviewed the triage vital signs and the nursing notes.  Pertinent labs & imaging results that were available during my care of the patient were reviewed by me and considered in my medical decision making (see chart for details).    Suspect likely muscle strain. Toradol injection administered in office, muscle relaxer prescribed. Encouraged follow up if symptoms fail to improve or worsen.   Final Clinical Impressions(s) / UC Diagnoses   Final diagnoses:  Strain of neck muscle, initial encounter     Discharge Instructions       Avoid NSAIDs today, ok to take tylenol today, ibuprofen or naproxen starting tomorrow.   Use muscle relaxer with caution- may cause drowsiness.  Heat may help with pain.   Slow controlled stretches may assist in faster recovery.      ED Prescriptions     Medication Sig Dispense Auth. Provider   cyclobenzaprine (FLEXERIL) 10 MG tablet Take 1 tablet (10 mg total) by mouth 2 (two) times daily as needed for muscle spasms. 20 tablet Francene Finders, PA-C      PDMP not reviewed this encounter.   Francene Finders, PA-C 02/10/22 1011

## 2022-02-10 NOTE — ED Triage Notes (Signed)
Report L. neck pain, restrict to mobility.   Onset : This morning  6 am upon waking up  Constant pain at 6 with mobility at 9 dull or aching  Took tylenol

## 2022-02-10 NOTE — Discharge Instructions (Signed)
  Avoid NSAIDs today, ok to take tylenol today, ibuprofen or naproxen starting tomorrow.   Use muscle relaxer with caution- may cause drowsiness.  Heat may help with pain.   Slow controlled stretches may assist in faster recovery.

## 2022-03-03 ENCOUNTER — Ambulatory Visit
Admission: EM | Admit: 2022-03-03 | Discharge: 2022-03-03 | Disposition: A | Discharge status: Home / Self Care | Payer: Medicaid Other | Attending: Emergency Medicine | Admitting: Emergency Medicine

## 2022-03-03 DIAGNOSIS — L500 Allergic urticaria: Secondary | ICD-10-CM | POA: Diagnosis not present

## 2022-03-03 DIAGNOSIS — T7840XD Allergy, unspecified, subsequent encounter: Secondary | ICD-10-CM | POA: Diagnosis not present

## 2022-03-03 DIAGNOSIS — R220 Localized swelling, mass and lump, head: Secondary | ICD-10-CM | POA: Diagnosis not present

## 2022-03-03 MED ORDER — MONTELUKAST SODIUM 10 MG PO TABS: 10.0000 mg | ORAL_TABLET | Freq: Every day | ORAL | 2 refills | Status: DC

## 2022-03-03 MED ORDER — METHYLPREDNISOLONE 4 MG PO TBPK: ORAL_TABLET | ORAL | 0 refills | Status: DC

## 2022-03-03 MED ORDER — CETIRIZINE HCL 10 MG PO TABS: 10.0000 mg | ORAL_TABLET | Freq: Every day | ORAL | 1 refills | Status: DC

## 2022-03-03 MED ORDER — TRIAMCINOLONE ACETONIDE 40 MG/ML IJ SUSP
40.0000 mg | Freq: Once | INTRAMUSCULAR | Status: AC
  Administered 2022-03-03: 40 mg via INTRAMUSCULAR

## 2022-03-03 NOTE — Discharge Instructions (Signed)
Because this is not the first time that you have had allergic reaction to an unknown substance, strongly recommend that you speak with your primary care provider about allergy testing and possible allergy desensitization therapy.  You also may require a prescription for an EpiPen.  To treat your current symptoms, you received an injection of Kenalog during your visit today.  I sent prescriptions to your pharmacy for tapering dose of methylprednisolone, Zyrtec and Singulair.  This evening, please begin taking 1 tablet each of Zyrtec and Singulair and continue taking it for the next 30 days.  After 30 days, you can discontinue but if you feel that your symptoms are returning, you can certainly resume them and take them for 2 more months.  Tomorrow morning, please begin taking methylprednisolone, 1 row of tablets daily with your morning meal until all rows are complete, this is a 6-day treatment.  Please follow-up with [place] or urgent care or go to the emergency room if this does not significantly resolve your facial swelling and itching.  You may require more aggressive treatment.  Thank you for visiting urgent care today.

## 2022-03-03 NOTE — ED Provider Notes (Addendum)
[facility]    CSN: [id number] Arrival date & time: 03/03/22  1008    HISTORY   Chief Complaint  Patient presents with   Facial Swelling   HPI Morgan Newton is a pleasant, 36 y.o. female who presents to urgent care today. The patient states yesteday she began having itchiness to her face and noticed swelling to her face. she states there is weeping to the sides of her nose. The patient denies SOB. The patient also states she noticed hives around her hands.  Home interventions: hydrocortisone, neosporin, ice pack, none have been helpful.  Patient states she recently returned from travel to [facility], states she ate a few restaurants but nothing unusual.  Patient denies new detergents, personal hygiene products.  Patient states she has had this in the past once, states she is never been allergy tested.  Patient states no when she was traveling with has any similar symptoms.  The history is provided by the patient.   Past Medical History:  Diagnosis Date   Abnormal Pap smear 2012   colpo   Anemia    Asthma    Chlamydia    Depression    DUB (dysfunctional uterine bleeding) 02/04/2021   GDM (gestational diabetes mellitus) 04/03/2021   Genital herpes    Gonorrhea    Heart murmur    History of IUFD 03/03/2021   Hypertension    PIH   PID (acute pelvic inflammatory disease)    Pregnancy induced hypertension    Trichimoniasis    Patient Active Problem List   Diagnosis Date Noted   De Quervain's tenosynovitis, left 01/29/2022   History of bilateral salpingectomy 08/24/2021   Chronic hypertension affecting pregnancy 08/22/2021   GDM (gestational diabetes mellitus) 04/03/2021   History of cesarean delivery 03/11/2021   Chronic hypertension 03/11/2021   History of IUFD 03/03/2021   Past Surgical History:  Procedure Laterality Date   CESAREAN SECTION  07/22/2012   Procedure: CESAREAN SECTION;  Surgeon: Frederico Hamman, MD;  Location: [facility];  Service: Obstetrics;   Laterality: N/A;  Primary Cesarean Section    GANGLION CYST EXCISION     MOUTH SURGERY     TUBAL LIGATION N/A 08/23/2021   Procedure: POST PARTUM TUBAL LIGATION;  Surgeon: Radene Gunning, MD;  Location: [facility];  Service: Gynecology;  Laterality: N/A;   OB History     Gravida  9   Para  3   Term  2   Preterm  1   AB  5   Living  2      SAB  2   IAB  3   Ectopic      Multiple      Live Births  2          Home Medications    Prior to Admission medications   Medication Sig Start Date End Date Taking? Authorizing Provider  cetirizine (ZYRTEC ALLERGY) 10 MG tablet Take 1 tablet (10 mg total) by mouth at bedtime. 03/03/22 08/30/22 Yes Lynden Oxford Scales, PA-C  methylPREDNISolone (MEDROL DOSEPAK) 4 MG TBPK tablet Take 24 mg on day 1, 20 mg on day 2, 16 mg on day 3, 12 mg on day 4, 8 mg on day 5, 4 mg on day 6.  Take all tablets in each row at once, do not spread tablets out throughout the day. 03/03/22  Yes Lynden Oxford Scales, PA-C  montelukast (SINGULAIR) 10 MG tablet Take 1 tablet (10 mg total) by mouth at  bedtime. 03/03/22 06/01/22 Yes Lynden Oxford Scales, PA-C  gabapentin (NEURONTIN) 300 MG capsule Take 1 capsule (300 mg total) by mouth 3 (three) times daily. 12/30/21 02/28/22  Camillia Herter, NP  ibuprofen (ADVIL) 600 MG tablet Take 1 tablet (600 mg total) by mouth every 6 (six) hours. 08/24/21   Renard Matter, MD  polyethylene glycol (MIRALAX / GLYCOLAX) 17 g packet Take 17 g by mouth daily.    [provider]  Prenatal Vit-Fe Fumarate-FA (PREPLUS) 27-1 MG TABS Take 1 tablet by mouth daily. 08/06/21   Chancy Milroy, MD  misoprostol (CYTOTEC) 200 MCG tablet Take 2 tablets by mouth and 2 intravaginally for one dose 04/14/20 07/24/20  Woodroe Mode, MD    Family History Family History  Problem Relation Age of Onset   Hypertension Maternal Grandfather    Hypertension Mother    Thyroid disease Mother    Hypertension Maternal Grandmother    Thyroid disease  Maternal Grandmother    Asthma Father    Asthma Maternal Aunt    Asthma Paternal Grandmother    Other Neg Hx    Hearing loss Neg Hx    Social History Social History   Tobacco Use   Smoking status: Some Days    Packs/day: 0.25    Types: Cigarettes    Passive exposure: Past   Smokeless tobacco: Never  Vaping Use   Vaping Use: Never used  Substance Use Topics   Alcohol use: Not Currently    Alcohol/week: 3.0 standard drinks of alcohol    Types: 3 Standard drinks or equivalent per week    Comment: not during preg   Drug use: No   Allergies   Penicillins and Caffeine  Review of Systems Review of Systems Pertinent findings revealed after performing a 14 point review of systems has been noted in the history of present illness.  Physical Exam Triage Vital Signs ED Triage Vitals  Enc Vitals Group     BP 05/15/21 0827 (!) 147/82     Pulse Rate 05/15/21 0827 72     Resp 05/15/21 0827 18     Temp 05/15/21 0827 98.3 F (36.8 C)     Temp Source 05/15/21 0827 Oral     SpO2 05/15/21 0827 98 %     Weight --      Height --      Head Circumference --      Peak Flow --      Pain Score 05/15/21 0826 5     Pain Loc --      Pain Edu? --      Excl. in [place]? --   No data found.  Updated Vital Signs BP (!) 132/91 (BP Location: Right Arm)   Pulse 79   Temp 98.4 F (36.9 C) (Oral)   Resp 18   LMP 02/19/2022   SpO2 98%   Physical Exam Vitals and nursing note reviewed.  Constitutional:      General: She is not in acute distress.    Appearance: Normal appearance. She is not ill-appearing.  HENT:     Head: Normocephalic and atraumatic.     Salivary Glands: Right salivary gland is not diffusely enlarged or tender. Left salivary gland is not diffusely enlarged or tender.     Comments: Facial swelling, left greater than right, lateral upper and lower eyelids are edematous    Right Ear: Ear canal and external ear normal. No drainage. A middle ear effusion is present. There is no  impacted  cerumen. Tympanic membrane is bulging. Tympanic membrane is not injected or erythematous.     Left Ear: Ear canal and external ear normal. No drainage. A middle ear effusion is present. There is no impacted cerumen. Tympanic membrane is bulging. Tympanic membrane is not injected or erythematous.     Ears:     Comments: Bilateral EACs normal, both TMs bulging with clear fluid    Nose: Rhinorrhea present. No nasal deformity, septal deviation, signs of injury, nasal tenderness, mucosal edema or congestion. Rhinorrhea is clear.     Right Nostril: Occlusion present. No foreign body, epistaxis or septal hematoma.     Left Nostril: Occlusion present. No foreign body, epistaxis or septal hematoma.     Right Turbinates: Enlarged, swollen and pale.     Left Turbinates: Enlarged, swollen and pale.     Right Sinus: No maxillary sinus tenderness or frontal sinus tenderness.     Left Sinus: No maxillary sinus tenderness or frontal sinus tenderness.     Mouth/Throat:     Lips: Pink. No lesions.     Mouth: Mucous membranes are moist. No oral lesions.     Pharynx: Oropharynx is clear. Uvula midline. No posterior oropharyngeal erythema or uvula swelling.     Tonsils: No tonsillar exudate. 0 on the right. 0 on the left.     Comments: Postnasal drip Eyes:     General: Lids are normal.        Right eye: No discharge.        Left eye: No discharge.     Extraocular Movements: Extraocular movements intact.     Conjunctiva/sclera: Conjunctivae normal.     Right eye: Right conjunctiva is not injected.     Left eye: Left conjunctiva is not injected.  Neck:     Trachea: Trachea and phonation normal.  Cardiovascular:     Rate and Rhythm: Normal rate and regular rhythm.     Pulses: Normal pulses.     Heart sounds: Normal heart sounds. No murmur heard.    No friction rub. No gallop.  Pulmonary:     Effort: Pulmonary effort is normal. No accessory muscle usage, prolonged expiration or respiratory distress.      Breath sounds: Normal breath sounds. No stridor, decreased air movement or transmitted upper airway sounds. No decreased breath sounds, wheezing, rhonchi or rales.  Chest:     Chest wall: No tenderness.  Musculoskeletal:        General: Normal range of motion.     Cervical back: Normal range of motion and neck supple. Normal range of motion.  Lymphadenopathy:     Cervical: No cervical adenopathy.  Skin:    General: Skin is warm and dry.     Findings: Rash (Urticarial rash both wrists) present. No erythema.  Neurological:     General: No focal deficit present.     Mental Status: She is alert and oriented to person, place, and time.  Psychiatric:        Mood and Affect: Mood normal.        Behavior: Behavior normal.     Visual Acuity Right Eye Distance:   Left Eye Distance:   Bilateral Distance:    Right Eye Near:   Left Eye Near:    Bilateral Near:     UC Couse / Diagnostics / Procedures:     Radiology No results found.  Procedures Procedures (including critical care time) EKG  Pending results:  Labs Reviewed - No data to display  Medications Ordered in UC: Medications  triamcinolone acetonide (KENALOG-40) injection 40 mg (has no administration in time range)    UC Diagnoses / Final Clinical Impressions(s)   I have reviewed the triage vital signs and the nursing notes.  Pertinent labs & imaging results that were available during my care of the patient were reviewed by me and considered in my medical decision making (see chart for details).    Final diagnoses:  Facial swelling  Allergic urticaria  Allergic reaction, subsequent encounter   Patient provided with a Kenalog injection the methylprednisolone Dosepak.  Patient also advised to begin Zyrtec and Singulair for the next 3 days.  Patient advised that after stopping Zyrtec and Singulair if her symptoms return, she should refill and continue taking for 2 more months.  Return precautions advised.  ED  precautions advised.  ED Prescriptions     Medication Sig Dispense Auth. Provider   cetirizine (ZYRTEC ALLERGY) 10 MG tablet Take 1 tablet (10 mg total) by mouth at bedtime. 90 tablet Lynden Oxford Scales, PA-C   montelukast (SINGULAIR) 10 MG tablet Take 1 tablet (10 mg total) by mouth at bedtime. 30 tablet Lynden Oxford Scales, PA-C   methylPREDNISolone (MEDROL DOSEPAK) 4 MG TBPK tablet Take 24 mg on day 1, 20 mg on day 2, 16 mg on day 3, 12 mg on day 4, 8 mg on day 5, 4 mg on day 6.  Take all tablets in each row at once, do not spread tablets out throughout the day. 21 tablet Lynden Oxford Scales, PA-C      PDMP not reviewed this encounter.  Pending results:  Labs Reviewed - No data to display  Discharge Instructions:   Discharge Instructions      Because this is not the first time that you have had allergic reaction to an unknown substance, strongly recommend that you speak with your primary care provider about allergy testing and possible allergy desensitization therapy.  You also may require a prescription for an EpiPen.  To treat your current symptoms, you received an injection of Kenalog during your visit today.  I sent prescriptions to your pharmacy for tapering dose of methylprednisolone, Zyrtec and Singulair.  This evening, please begin taking 1 tablet each of Zyrtec and Singulair and continue taking it for the next 30 days.  After 30 days, you can discontinue but if you feel that your symptoms are returning, you can certainly resume them and take them for 2 more months.  Tomorrow morning, please begin taking methylprednisolone, 1 row of tablets daily with your morning meal until all rows are complete, this is a 6-day treatment.  Please follow-up with [place] or urgent care or go to the emergency room if this does not significantly resolve your facial swelling and itching.  You may require more aggressive treatment.  Thank you for visiting urgent care  today.    Disposition Upon Discharge:  Condition: stable for discharge home  Patient presented with an acute illness with associated systemic symptoms and significant discomfort requiring urgent management. In my opinion, this is a condition that a prudent lay person (someone who possesses an average knowledge of health and medicine) may potentially expect to result in complications if not addressed urgently such as respiratory distress, impairment of bodily function or dysfunction of bodily organs.   Routine symptom specific, illness specific and/or disease specific instructions were discussed with the patient and/or caregiver at length.   As such, the patient has been evaluated and assessed, work-up was performed  and treatment was provided in alignment with urgent care protocols and evidence based medicine.  Patient/parent/caregiver has been advised that the patient may require follow up for further testing and treatment if the symptoms continue in spite of treatment, as clinically indicated and appropriate.  Patient/parent/caregiver has been advised to return to the [facility] or PCP if no better; to PCP or the Emergency Department if new signs and symptoms develop, or if the current signs or symptoms continue to change or worsen for further workup, evaluation and treatment as clinically indicated and appropriate  The patient will follow up with their current PCP if and as advised. If the patient does not currently have a PCP we will assist them in obtaining one.   The patient may need specialty follow up if the symptoms continue, in spite of conservative treatment and management, for further workup, evaluation, consultation and treatment as clinically indicated and appropriate.   Patient/parent/caregiver verbalized understanding and agreement of plan as discussed.  All questions were addressed during visit.  Please see discharge instructions below for further details of plan.  This office note has  been dictated using Museum/gallery curator.  Unfortunately, this method of dictation can sometimes lead to typographical or grammatical errors.  I apologize for your inconvenience in advance if this occurs.  Please do not hesitate to reach out to me if clarification is needed.      Lynden Oxford Scales, PA-C 03/03/22 1105    Lynden Oxford [place], [place] 03/03/22 1106

## 2022-03-03 NOTE — ED Triage Notes (Addendum)
The patient states yesteday she began having itchiness to her face and noticed swelling to her face. she states there is weeping to the sides of her nose. The patient denies SOB. The patient also states she noticed hives around her hands.  Home interventions: hydrocortisone, neosporin, ice pack

## 2022-08-11 DIAGNOSIS — J020 Streptococcal pharyngitis: Secondary | ICD-10-CM | POA: Diagnosis not present

## 2022-08-11 DIAGNOSIS — R059 Cough, unspecified: Secondary | ICD-10-CM | POA: Diagnosis not present

## 2022-08-17 DIAGNOSIS — L309 Dermatitis, unspecified: Secondary | ICD-10-CM | POA: Diagnosis not present

## 2022-11-17 DIAGNOSIS — L309 Dermatitis, unspecified: Secondary | ICD-10-CM | POA: Diagnosis not present

## 2022-11-26 DIAGNOSIS — R051 Acute cough: Secondary | ICD-10-CM | POA: Diagnosis not present

## 2022-11-26 DIAGNOSIS — J4541 Moderate persistent asthma with (acute) exacerbation: Secondary | ICD-10-CM | POA: Diagnosis not present

## 2022-11-26 DIAGNOSIS — J209 Acute bronchitis, unspecified: Secondary | ICD-10-CM | POA: Diagnosis not present

## 2023-01-07 DIAGNOSIS — L309 Dermatitis, unspecified: Secondary | ICD-10-CM | POA: Diagnosis not present

## 2023-02-14 DIAGNOSIS — M25562 Pain in left knee: Secondary | ICD-10-CM | POA: Diagnosis not present

## 2023-02-17 DIAGNOSIS — M25562 Pain in left knee: Secondary | ICD-10-CM | POA: Diagnosis not present

## 2023-02-17 DIAGNOSIS — M2392 Unspecified internal derangement of left knee: Secondary | ICD-10-CM | POA: Diagnosis not present

## 2023-03-02 DIAGNOSIS — L732 Hidradenitis suppurativa: Secondary | ICD-10-CM | POA: Diagnosis not present

## 2023-03-02 DIAGNOSIS — L309 Dermatitis, unspecified: Secondary | ICD-10-CM | POA: Diagnosis not present

## 2023-03-14 DIAGNOSIS — M25562 Pain in left knee: Secondary | ICD-10-CM | POA: Diagnosis not present

## 2023-03-24 NOTE — Telephone Encounter (Signed)
See routing comment(s) for message information.

## 2023-03-30 DIAGNOSIS — M25562 Pain in left knee: Secondary | ICD-10-CM | POA: Diagnosis not present

## 2023-03-30 DIAGNOSIS — M25662 Stiffness of left knee, not elsewhere classified: Secondary | ICD-10-CM | POA: Diagnosis not present

## 2023-06-03 DIAGNOSIS — L309 Dermatitis, unspecified: Secondary | ICD-10-CM | POA: Diagnosis not present

## 2023-06-03 DIAGNOSIS — L732 Hidradenitis suppurativa: Secondary | ICD-10-CM | POA: Diagnosis not present

## 2023-08-12 DIAGNOSIS — Z20828 Contact with and (suspected) exposure to other viral communicable diseases: Secondary | ICD-10-CM | POA: Diagnosis not present

## 2023-08-12 DIAGNOSIS — B349 Viral infection, unspecified: Secondary | ICD-10-CM | POA: Diagnosis not present

## 2024-01-19 DIAGNOSIS — R109 Unspecified abdominal pain: Secondary | ICD-10-CM | POA: Diagnosis not present

## 2024-01-19 DIAGNOSIS — R1012 Left upper quadrant pain: Secondary | ICD-10-CM | POA: Diagnosis not present

## 2024-01-19 DIAGNOSIS — R110 Nausea: Secondary | ICD-10-CM | POA: Diagnosis not present

## 2024-01-27 DIAGNOSIS — R109 Unspecified abdominal pain: Secondary | ICD-10-CM | POA: Diagnosis not present

## 2024-07-16 ENCOUNTER — Ambulatory Visit
Admission: EM | Admit: 2024-07-16 | Discharge: 2024-07-16 | Disposition: A | Discharge status: Home / Self Care | Attending: Family Medicine | Admitting: Family Medicine

## 2024-07-16 DIAGNOSIS — S60412A Abrasion of right middle finger, initial encounter: Secondary | ICD-10-CM

## 2024-07-16 DIAGNOSIS — L309 Dermatitis, unspecified: Secondary | ICD-10-CM

## 2024-07-16 MED ORDER — HYDROXYZINE HCL 25 MG PO TABS: 12.5000 mg | ORAL_TABLET | Freq: Three times a day (TID) | ORAL | 0 refills | Status: AC | PRN

## 2024-07-16 MED ORDER — PREDNISONE 20 MG PO TABS: ORAL_TABLET | ORAL | 0 refills | Status: AC

## 2024-07-16 NOTE — ED Triage Notes (Addendum)
 Pt has c/o rash on left cheek that spread to the right eye yesterday, and unsure of what caused it. Pt states this has happened in the past when had a scratch under her eye and developed an infection.Pt states she cut her right middle finger on the blinds of her apartment in 07/04/24, and not sure what when her last tetanus shot was and wants to be sure it's not reoccurring again. Pt hasn't taken any medications at home, just used antibiotic ointment on right middle finger

## 2024-07-16 NOTE — ED Provider Notes (Signed)
 " [facility]  Note:  This document was prepared using Conservation officer, historic buildings and may include unintentional dictation errors.  MRN: [id number] DOB: 06-17-1986  Subjective:   Morgan Newton is a 38 y.o. female presenting for 1 day history of rapidly progressing itching and irritation about the face.  Reports that her son inadvertently scratched the medial aspect of her left eye around the bridge of her nose.  Since then she has had urticarial lesions develop over her face worse on the left side.  They do have a new dog in the home.  Otherwise no other new exposures.  She also suffered an abrasion of the right middle finger on 07/04/2024.  Has tried to keep the wound covered and dressed.  Unfortunately she has reinjured it multiple times and would like to have the wound evaluated.  It was weeping some.  No fever, drainage of pus or bleeding.  No difficulty with her breathing, wheezing, nausea, vomiting.  No lip swelling.  Current Outpatient Medications  Medication Instructions    Allergies[1]  Past Medical History:  Diagnosis Date   Abnormal Pap smear 2012   colpo   Anemia    Asthma    Chlamydia    Depression    DUB (dysfunctional uterine bleeding) 02/04/2021   GDM (gestational diabetes mellitus) 04/03/2021   Genital herpes    Gonorrhea    Heart murmur    History of IUFD 03/03/2021   Hypertension    PIH   PID (acute pelvic inflammatory disease)    Pregnancy induced hypertension    Trichimoniasis      Past Surgical History:  Procedure Laterality Date   CESAREAN SECTION  07/22/2012   Procedure: CESAREAN SECTION;  Surgeon: Aida DELENA Na, MD;  Location: [facility];  Service: Obstetrics;  Laterality: N/A;  Primary Cesarean Section    GANGLION CYST EXCISION     MOUTH SURGERY     TUBAL LIGATION N/A 08/23/2021   Procedure: POST PARTUM TUBAL LIGATION;  Surgeon: Cleatus Moccasin, MD;  Location: [facility];  Service: Gynecology;  Laterality: N/A;     Family History  Problem Relation Age of Onset   Hypertension Maternal Grandfather    Hypertension Mother    Thyroid  disease Mother    Hypertension Maternal Grandmother    Thyroid  disease Maternal Grandmother    Asthma Father    Asthma Maternal Aunt    Asthma Paternal Grandmother    Other Neg Hx    Hearing loss Neg Hx     Social History   Occupational History   Not on file  Tobacco Use   Smoking status: Some Days    Current packs/day: 0.25    Types: Cigarettes    Passive exposure: Past   Smokeless tobacco: Never  Vaping Use   Vaping status: Never Used  Substance and Sexual Activity   Alcohol use: Not Currently    Alcohol/week: 3.0 standard drinks of alcohol    Types: 3 Standard drinks or equivalent per week    Comment: not during preg   Drug use: No   Sexual activity: Not Currently    Birth control/protection: None     ROS   Objective:   Vitals: BP 132/83 (BP Location: Right Arm)   Pulse 60   Temp 98 F (36.7 C) (Oral)   Resp 17   LMP 07/16/2024 (Exact Date)   SpO2 98%   Physical Exam Constitutional:      General: She is not in  acute distress.    Appearance: Normal appearance. She is well-developed. She is not ill-appearing, toxic-appearing or diaphoretic.  HENT:     Head: Normocephalic and atraumatic.     Nose: Nose normal.     Mouth/Throat:     Mouth: Mucous membranes are moist.  Eyes:     General: No scleral icterus.       Right eye: No discharge.        Left eye: No discharge.     Extraocular Movements: Extraocular movements intact.  Cardiovascular:     Rate and Rhythm: Normal rate.  Pulmonary:     Effort: Pulmonary effort is normal.  Skin:    General: Skin is warm and dry.     Findings: Lesion (resolving abrasion of the or aspect of the right middle finger without swelling, tenderness, drainage of pus or bleeding) and rash (urticarial lesions along the left side of her face, eyelids as well as the right side to a lesser degree) present.   Neurological:     General: No focal deficit present.     Mental Status: She is alert and oriented to person, place, and time.  Psychiatric:        Mood and Affect: Mood normal.        Behavior: Behavior normal.         Assessment and Plan :   PDMP not reviewed this encounter.  1. Facial dermatitis   2. Abrasion of right middle finger, initial encounter      Recommended a oral prednisone  course with hydroxyzine  for facial dermatitis, likely allergic.  The abrasion is resolving and recommended continue dressing changes.  Counseled patient on potential for adverse effects with medications prescribed/recommended today, ER and return-to-clinic precautions discussed, patient verbalized understanding.     [1]  Allergies Allergen Reactions   Penicillins Shortness Of Breath and Palpitations    .SABRAHas patient had a PCN reaction causing immediate rash, facial/tongue/throat swelling, SOB or lightheadedness with hypotension: NO Has patient had a PCN reaction causing severe rash involving mucus membranes or skin necrosis: NO Has patient had a PCN reaction that required hospitalization No Has patient had a PCN reaction occurring within the last 10 years: No If all of the above answers are NO, then may proceed with Cephalosporin use.    Caffeine Other (See Comments)    Heart murmur     Christopher Savannah, [place] 07/16/24 1711  "
